# Patient Record
Sex: Female | Born: 1985 | Race: White | Hispanic: No | Marital: Single | Smoking: Current every day smoker
Health system: Southern US, Community
[De-identification: ages and names within clinical notes are randomized; demographics above are authoritative.]

## PROBLEM LIST (undated history)

## (undated) DIAGNOSIS — F1020 Alcohol dependence, uncomplicated: Secondary | ICD-10-CM

## (undated) DIAGNOSIS — B977 Papillomavirus as the cause of diseases classified elsewhere: Secondary | ICD-10-CM

## (undated) DIAGNOSIS — F4310 Post-traumatic stress disorder, unspecified: Secondary | ICD-10-CM

## (undated) DIAGNOSIS — F32A Depression, unspecified: Secondary | ICD-10-CM

## (undated) DIAGNOSIS — Z789 Other specified health status: Secondary | ICD-10-CM

## (undated) HISTORY — PX: NO PAST SURGERIES: SHX2092

---
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
## 2006-04-07 ENCOUNTER — Inpatient Hospital Stay (HOSPITAL_COMMUNITY): Admission: AD | Admit: 2006-04-07 | Discharge: 2006-04-08 | Payer: Self-pay | Admitting: Obstetrics

## 2006-05-04 ENCOUNTER — Ambulatory Visit (HOSPITAL_COMMUNITY): Admission: RE | Admit: 2006-05-04 | Discharge: 2006-05-04 | Payer: Self-pay | Admitting: Obstetrics & Gynecology

## 2006-05-24 ENCOUNTER — Ambulatory Visit (HOSPITAL_COMMUNITY): Admission: RE | Admit: 2006-05-24 | Discharge: 2006-05-24 | Payer: Self-pay | Admitting: Obstetrics & Gynecology

## 2006-06-03 ENCOUNTER — Ambulatory Visit (HOSPITAL_COMMUNITY): Admission: RE | Admit: 2006-06-03 | Discharge: 2006-06-03 | Payer: Self-pay | Admitting: Obstetrics & Gynecology

## 2006-10-07 ENCOUNTER — Inpatient Hospital Stay (HOSPITAL_COMMUNITY): Admission: AD | Admit: 2006-10-07 | Discharge: 2006-10-07 | Payer: Self-pay | Admitting: Obstetrics

## 2006-10-16 ENCOUNTER — Inpatient Hospital Stay (HOSPITAL_COMMUNITY): Admission: AD | Admit: 2006-10-16 | Discharge: 2006-10-16 | Payer: Self-pay | Admitting: Obstetrics

## 2006-10-17 ENCOUNTER — Inpatient Hospital Stay (HOSPITAL_COMMUNITY): Admission: AD | Admit: 2006-10-17 | Discharge: 2006-10-21 | Payer: Self-pay | Admitting: Obstetrics & Gynecology

## 2007-08-29 ENCOUNTER — Other Ambulatory Visit: Payer: Self-pay | Admitting: Emergency Medicine

## 2007-08-29 ENCOUNTER — Inpatient Hospital Stay (HOSPITAL_COMMUNITY): Admission: AD | Admit: 2007-08-29 | Discharge: 2007-08-29 | Payer: Self-pay | Admitting: Obstetrics & Gynecology

## 2008-06-10 ENCOUNTER — Emergency Department (HOSPITAL_COMMUNITY): Admission: EM | Admit: 2008-06-10 | Discharge: 2008-06-10 | Payer: Self-pay | Admitting: Family Medicine

## 2008-12-02 ENCOUNTER — Inpatient Hospital Stay (HOSPITAL_COMMUNITY): Admission: AD | Admit: 2008-12-02 | Discharge: 2008-12-03 | Payer: Self-pay | Admitting: Obstetrics & Gynecology

## 2008-12-26 ENCOUNTER — Emergency Department (HOSPITAL_COMMUNITY): Admission: EM | Admit: 2008-12-26 | Discharge: 2008-12-26 | Payer: Self-pay | Admitting: Emergency Medicine

## 2009-01-06 ENCOUNTER — Emergency Department (HOSPITAL_COMMUNITY): Admission: EM | Admit: 2009-01-06 | Discharge: 2009-01-07 | Payer: Self-pay | Admitting: Emergency Medicine

## 2009-01-22 ENCOUNTER — Inpatient Hospital Stay (HOSPITAL_COMMUNITY): Admission: AD | Admit: 2009-01-22 | Discharge: 2009-01-22 | Payer: Self-pay | Admitting: Obstetrics & Gynecology

## 2009-01-22 ENCOUNTER — Ambulatory Visit: Payer: Self-pay | Admitting: Advanced Practice Midwife

## 2009-02-17 ENCOUNTER — Inpatient Hospital Stay (HOSPITAL_COMMUNITY): Admission: AD | Admit: 2009-02-17 | Discharge: 2009-02-17 | Payer: Self-pay | Admitting: Obstetrics & Gynecology

## 2009-02-20 ENCOUNTER — Emergency Department (HOSPITAL_COMMUNITY): Admission: EM | Admit: 2009-02-20 | Discharge: 2009-02-20 | Payer: Self-pay | Admitting: Family Medicine

## 2009-02-21 ENCOUNTER — Inpatient Hospital Stay (HOSPITAL_COMMUNITY): Admission: AD | Admit: 2009-02-21 | Discharge: 2009-02-21 | Payer: Self-pay | Admitting: Obstetrics & Gynecology

## 2009-03-09 ENCOUNTER — Emergency Department (HOSPITAL_COMMUNITY): Admission: EM | Admit: 2009-03-09 | Discharge: 2009-03-09 | Payer: Self-pay | Admitting: Emergency Medicine

## 2009-03-29 ENCOUNTER — Emergency Department (HOSPITAL_COMMUNITY): Admission: EM | Admit: 2009-03-29 | Discharge: 2009-03-29 | Payer: Self-pay | Admitting: Emergency Medicine

## 2009-04-18 ENCOUNTER — Inpatient Hospital Stay: Admission: AD | Admit: 2009-04-18 | Discharge: 2009-04-18 | Payer: Self-pay | Admitting: Obstetrics & Gynecology

## 2009-05-20 ENCOUNTER — Inpatient Hospital Stay (HOSPITAL_COMMUNITY): Admission: AD | Admit: 2009-05-20 | Discharge: 2009-05-20 | Payer: Self-pay | Admitting: Obstetrics & Gynecology

## 2009-07-01 ENCOUNTER — Inpatient Hospital Stay (HOSPITAL_COMMUNITY): Admission: AD | Admit: 2009-07-01 | Discharge: 2009-07-01 | Payer: Self-pay | Admitting: Family Medicine

## 2009-12-14 ENCOUNTER — Ambulatory Visit: Payer: Self-pay | Admitting: Advanced Practice Midwife

## 2009-12-14 ENCOUNTER — Inpatient Hospital Stay (HOSPITAL_COMMUNITY): Admission: AD | Admit: 2009-12-14 | Discharge: 2009-12-14 | Payer: Self-pay | Admitting: Obstetrics and Gynecology

## 2009-12-23 ENCOUNTER — Inpatient Hospital Stay (HOSPITAL_COMMUNITY): Admission: AD | Admit: 2009-12-23 | Discharge: 2009-12-23 | Payer: Self-pay | Admitting: Obstetrics & Gynecology

## 2010-03-18 ENCOUNTER — Ambulatory Visit (HOSPITAL_COMMUNITY): Admission: RE | Admit: 2010-03-18 | Discharge: 2010-03-18 | Payer: Self-pay | Admitting: Obstetrics & Gynecology

## 2010-04-06 ENCOUNTER — Ambulatory Visit: Payer: Self-pay | Admitting: Obstetrics and Gynecology

## 2010-04-06 ENCOUNTER — Inpatient Hospital Stay (HOSPITAL_COMMUNITY): Admission: AD | Admit: 2010-04-06 | Discharge: 2010-04-06 | Payer: Self-pay | Admitting: Obstetrics & Gynecology

## 2010-06-26 ENCOUNTER — Inpatient Hospital Stay (HOSPITAL_COMMUNITY): Admission: AD | Admit: 2010-06-26 | Discharge: 2010-06-26 | Payer: Self-pay | Admitting: Obstetrics

## 2010-08-15 ENCOUNTER — Inpatient Hospital Stay (HOSPITAL_COMMUNITY)
Admission: AD | Admit: 2010-08-15 | Discharge: 2010-08-15 | Payer: Self-pay | Source: Home / Self Care | Attending: Obstetrics | Admitting: Obstetrics

## 2010-08-16 ENCOUNTER — Inpatient Hospital Stay (HOSPITAL_COMMUNITY)
Admission: AD | Admit: 2010-08-16 | Discharge: 2010-08-18 | Payer: Self-pay | Source: Home / Self Care | Attending: Obstetrics & Gynecology | Admitting: Obstetrics & Gynecology

## 2010-08-16 ENCOUNTER — Encounter: Payer: Self-pay | Admitting: Obstetrics & Gynecology

## 2010-10-26 LAB — CBC
HCT: 33 % — ABNORMAL LOW (ref 36.0–46.0)
MCH: 27.5 pg (ref 26.0–34.0)
MCHC: 33.3 g/dL (ref 30.0–36.0)
MCHC: 33.6 g/dL (ref 30.0–36.0)
MCV: 82.9 fL (ref 78.0–100.0)
Platelets: 167 10*3/uL (ref 150–400)
RDW: 13.6 % (ref 11.5–15.5)
RDW: 13.7 % (ref 11.5–15.5)
WBC: 8.5 10*3/uL (ref 4.0–10.5)

## 2010-10-26 LAB — RPR: RPR Ser Ql: NONREACTIVE

## 2010-10-27 LAB — URINALYSIS, ROUTINE W REFLEX MICROSCOPIC
Bilirubin Urine: NEGATIVE
Nitrite: NEGATIVE
Protein, ur: NEGATIVE mg/dL
Specific Gravity, Urine: 1.01 (ref 1.005–1.030)
Urobilinogen, UA: 0.2 mg/dL (ref 0.0–1.0)

## 2010-10-30 LAB — WET PREP, GENITAL: Yeast Wet Prep HPF POC: NONE SEEN

## 2010-10-30 LAB — URINALYSIS, ROUTINE W REFLEX MICROSCOPIC
Bilirubin Urine: NEGATIVE
Glucose, UA: NEGATIVE mg/dL
Hgb urine dipstick: NEGATIVE
Ketones, ur: NEGATIVE mg/dL
Protein, ur: NEGATIVE mg/dL
Urobilinogen, UA: 0.2 mg/dL (ref 0.0–1.0)

## 2010-11-03 LAB — CBC
HCT: 37.7 % (ref 36.0–46.0)
MCV: 94.5 fL (ref 78.0–100.0)
Platelets: 220 10*3/uL (ref 150–400)
RDW: 12.4 % (ref 11.5–15.5)
WBC: 7.8 10*3/uL (ref 4.0–10.5)

## 2010-11-03 LAB — WET PREP, GENITAL
Trich, Wet Prep: NONE SEEN
Yeast Wet Prep HPF POC: NONE SEEN

## 2010-11-03 LAB — URINALYSIS, ROUTINE W REFLEX MICROSCOPIC
Bilirubin Urine: NEGATIVE
Bilirubin Urine: NEGATIVE
Ketones, ur: NEGATIVE mg/dL
Ketones, ur: NEGATIVE mg/dL
Leukocytes, UA: NEGATIVE
Nitrite: NEGATIVE
Nitrite: NEGATIVE
Protein, ur: NEGATIVE mg/dL
Specific Gravity, Urine: 1.015 (ref 1.005–1.030)
Urobilinogen, UA: 0.2 mg/dL (ref 0.0–1.0)
Urobilinogen, UA: 0.2 mg/dL (ref 0.0–1.0)
pH: 7.5 (ref 5.0–8.0)

## 2010-11-03 LAB — URINE MICROSCOPIC-ADD ON

## 2010-11-03 LAB — HCG, QUANTITATIVE, PREGNANCY: hCG, Beta Chain, Quant, S: 32261 m[IU]/mL — ABNORMAL HIGH (ref <5)

## 2010-11-03 LAB — GC/CHLAMYDIA PROBE AMP, GENITAL
Chlamydia, DNA Probe: NEGATIVE
GC Probe Amp, Genital: NEGATIVE

## 2010-11-18 ENCOUNTER — Other Ambulatory Visit: Payer: Self-pay | Admitting: Obstetrics & Gynecology

## 2010-11-18 LAB — URINALYSIS, ROUTINE W REFLEX MICROSCOPIC
Bilirubin Urine: NEGATIVE
Glucose, UA: NEGATIVE mg/dL
Leukocytes, UA: NEGATIVE
Nitrite: NEGATIVE
Specific Gravity, Urine: 1.02 (ref 1.005–1.030)
pH: 7 (ref 5.0–8.0)

## 2010-11-18 LAB — URINE MICROSCOPIC-ADD ON

## 2010-11-18 LAB — POCT PREGNANCY, URINE: Preg Test, Ur: NEGATIVE

## 2010-11-19 LAB — URINE MICROSCOPIC-ADD ON

## 2010-11-19 LAB — URINALYSIS, ROUTINE W REFLEX MICROSCOPIC
Glucose, UA: NEGATIVE mg/dL
Specific Gravity, Urine: 1.01 (ref 1.005–1.030)

## 2010-11-19 LAB — WET PREP, GENITAL

## 2010-11-20 LAB — URINALYSIS, ROUTINE W REFLEX MICROSCOPIC
Ketones, ur: NEGATIVE mg/dL
Leukocytes, UA: NEGATIVE
Nitrite: NEGATIVE
Specific Gravity, Urine: 1.025 (ref 1.005–1.030)
pH: 6.5 (ref 5.0–8.0)

## 2010-11-20 LAB — POCT PREGNANCY, URINE: Preg Test, Ur: NEGATIVE

## 2010-11-20 LAB — GC/CHLAMYDIA PROBE AMP, GENITAL: GC Probe Amp, Genital: NEGATIVE

## 2010-11-20 LAB — URINE MICROSCOPIC-ADD ON: RBC / HPF: NONE SEEN RBC/hpf (ref <3)

## 2010-11-20 LAB — WET PREP, GENITAL: Yeast Wet Prep HPF POC: NONE SEEN

## 2010-11-22 LAB — WET PREP, GENITAL: Clue Cells Wet Prep HPF POC: NONE SEEN

## 2010-11-22 LAB — URINALYSIS, ROUTINE W REFLEX MICROSCOPIC
Bilirubin Urine: NEGATIVE
Glucose, UA: NEGATIVE mg/dL
Ketones, ur: NEGATIVE mg/dL
Leukocytes, UA: NEGATIVE
Nitrite: NEGATIVE
Nitrite: NEGATIVE
Protein, ur: NEGATIVE mg/dL
Urobilinogen, UA: 0.2 mg/dL (ref 0.0–1.0)
pH: 6 (ref 5.0–8.0)

## 2010-11-22 LAB — URINE MICROSCOPIC-ADD ON

## 2010-12-06 ENCOUNTER — Emergency Department (HOSPITAL_COMMUNITY): Payer: Medicaid Other

## 2010-12-06 ENCOUNTER — Emergency Department (HOSPITAL_COMMUNITY)
Admission: EM | Admit: 2010-12-06 | Discharge: 2010-12-06 | Disposition: A | Discharge status: Home / Self Care | Payer: Medicaid Other | Attending: Emergency Medicine | Admitting: Emergency Medicine

## 2010-12-06 DIAGNOSIS — R1011 Right upper quadrant pain: Secondary | ICD-10-CM | POA: Insufficient documentation

## 2010-12-06 DIAGNOSIS — R112 Nausea with vomiting, unspecified: Secondary | ICD-10-CM | POA: Insufficient documentation

## 2010-12-06 DIAGNOSIS — R197 Diarrhea, unspecified: Secondary | ICD-10-CM | POA: Insufficient documentation

## 2010-12-06 DIAGNOSIS — R079 Chest pain, unspecified: Secondary | ICD-10-CM | POA: Insufficient documentation

## 2010-12-06 DIAGNOSIS — R630 Anorexia: Secondary | ICD-10-CM | POA: Insufficient documentation

## 2010-12-06 LAB — URINALYSIS, ROUTINE W REFLEX MICROSCOPIC
Protein, ur: NEGATIVE mg/dL
Urobilinogen, UA: 0.2 mg/dL (ref 0.0–1.0)

## 2010-12-06 LAB — CBC
Platelets: 248 10*3/uL (ref 150–400)
RDW: 13.9 % (ref 11.5–15.5)
WBC: 6.9 10*3/uL (ref 4.0–10.5)

## 2010-12-06 LAB — COMPREHENSIVE METABOLIC PANEL
AST: 23 U/L (ref 0–37)
Albumin: 3.9 g/dL (ref 3.5–5.2)
CO2: 25 mEq/L (ref 19–32)
Calcium: 9.1 mg/dL (ref 8.4–10.5)
Creatinine, Ser: 0.61 mg/dL (ref 0.4–1.2)
GFR calc Af Amer: >60 mL/min (ref >60)
GFR calc non Af Amer: >60 mL/min (ref >60)

## 2010-12-06 LAB — DIFFERENTIAL
Basophils Absolute: 0 10*3/uL (ref 0.0–0.1)
Eosinophils Absolute: 0.2 10*3/uL (ref 0.0–0.7)
Eosinophils Relative: 3 % (ref 0–5)

## 2010-12-17 ENCOUNTER — Inpatient Hospital Stay (HOSPITAL_COMMUNITY)
Admission: AD | Admit: 2010-12-17 | Discharge: 2010-12-17 | Disposition: A | Discharge status: Home / Self Care | Payer: Medicaid Other | Source: Ambulatory Visit | Attending: Obstetrics & Gynecology | Admitting: Obstetrics & Gynecology

## 2010-12-17 DIAGNOSIS — N949 Unspecified condition associated with female genital organs and menstrual cycle: Secondary | ICD-10-CM | POA: Insufficient documentation

## 2010-12-17 DIAGNOSIS — N390 Urinary tract infection, site not specified: Secondary | ICD-10-CM

## 2010-12-17 LAB — URINALYSIS, ROUTINE W REFLEX MICROSCOPIC
Glucose, UA: NEGATIVE mg/dL
Nitrite: NEGATIVE
Protein, ur: NEGATIVE mg/dL
Urobilinogen, UA: 0.2 mg/dL (ref 0.0–1.0)

## 2010-12-17 LAB — URINE MICROSCOPIC-ADD ON

## 2010-12-17 LAB — POCT PREGNANCY, URINE: Preg Test, Ur: NEGATIVE

## 2010-12-17 LAB — WET PREP, GENITAL: Clue Cells Wet Prep HPF POC: NONE SEEN

## 2010-12-18 LAB — GC/CHLAMYDIA PROBE AMP, GENITAL
Chlamydia, DNA Probe: NEGATIVE
GC Probe Amp, Genital: NEGATIVE

## 2011-01-01 NOTE — Op Note (Signed)
[name], [name] NO.:  [id number]   MEDICAL RECORD NO.:  [id number]          PATIENT TYPE:  INP   LOCATION:  9174                          FACILITY:  [facility]   PHYSICIAN:  Kathreen Cosier, M.D.DATE OF BIRTH:  Oct 03, 1985   DATE OF PROCEDURE:  DATE OF DISCHARGE:                               OPERATIVE REPORT   DELIVERY NOTE:  Patient is a 25 year old primigravida who received  Pitocin stimulation and became fully dilated.  With each push, she had  deep variables, down to 90 with full recovery.  She pushed a +3 station.  Her perineum was injected with 1% Xylocaine.  A midline episiotomy done.  A vacuum applied at +3 station.  It was applied through one contraction  and push with no popoff.  Delivery effected.  There was a nuchal cord  cut prior to delivery of the shoulder.  She had a female Apgar 8 and 9,  weighing 7 pounds and 2 ounces from the ROA position.  The placenta was  spontaneously intact.  The fourth degree, the rectal mucosa was repaired  in two layers with 2-0 Vicryl suture, and then the anal sphincter  repaired in layers, and the episiotomy repaired in layers in the usual  manner.  Post repair, the mucosa of the rectum was noted to be intact,  and the sphincter was intact.  The patient tolerated the procedure well.           ______________________________  Kathreen Cosier, M.D.     BAM/MEDQ  D:  10/18/2006  T:  10/19/2006  Job:  [id number]

## 2011-04-04 ENCOUNTER — Emergency Department (HOSPITAL_COMMUNITY)
Admission: EM | Admit: 2011-04-04 | Discharge: 2011-04-04 | Disposition: A | Discharge status: Home / Self Care | Payer: Medicaid Other | Attending: Emergency Medicine | Admitting: Emergency Medicine

## 2011-04-04 DIAGNOSIS — L299 Pruritus, unspecified: Secondary | ICD-10-CM | POA: Insufficient documentation

## 2011-04-04 DIAGNOSIS — L259 Unspecified contact dermatitis, unspecified cause: Secondary | ICD-10-CM | POA: Insufficient documentation

## 2011-05-06 LAB — I-STAT 8, (EC8 V) (CONVERTED LAB)
BUN: 8
Glucose, Bld: 88
HCT: 45
Hemoglobin: 15.3 — ABNORMAL HIGH
Operator id: 235561
Potassium: 4
Sodium: 139

## 2011-05-06 LAB — POCT URINALYSIS DIP (DEVICE)
Hgb urine dipstick: NEGATIVE
Protein, ur: 30 — AB
Specific Gravity, Urine: 1.02
Urobilinogen, UA: 0.2
pH: 7

## 2011-05-06 LAB — WET PREP, GENITAL: Yeast Wet Prep HPF POC: NONE SEEN

## 2011-05-06 LAB — GC/CHLAMYDIA PROBE AMP, GENITAL: Chlamydia, DNA Probe: POSITIVE — AB

## 2011-05-06 LAB — ABO/RH: ABO/RH(D): B POS

## 2011-05-17 LAB — POCT RAPID STREP A: Streptococcus, Group A Screen (Direct): NEGATIVE

## 2011-07-29 IMAGING — US US OB COMP LESS 14 WK
1 series · 14 of 28 positions shown · non-contrast
Comparison: none

OBSTETRICAL ULTRASOUND:
 This ultrasound exam was performed in the [HOSPITAL] Ultrasound Department.  The OB US report was generated in the AS system, and faxed to the ordering physician.  This report is also available in [HOSPITAL]?s Access[employer] and in [REDACTED] PACS.

[Series 1: us ob comp less 14 wks · 0.18mm/px · 14 of 37 slices shown]
[im 2/37]
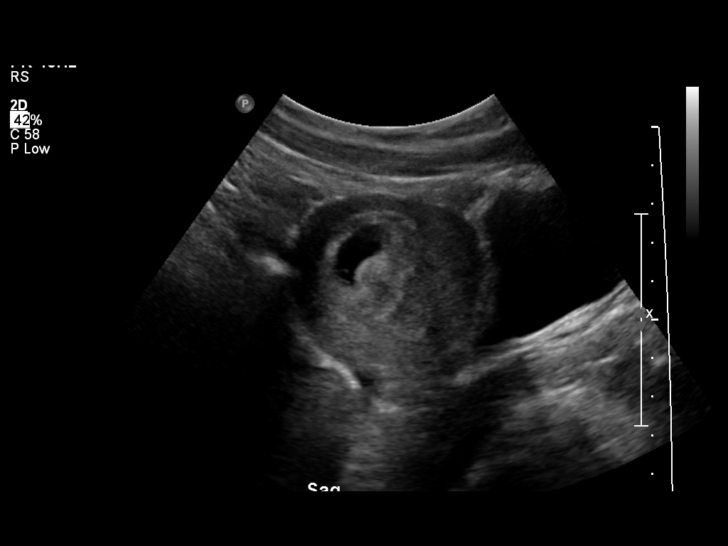
[im 5/37]
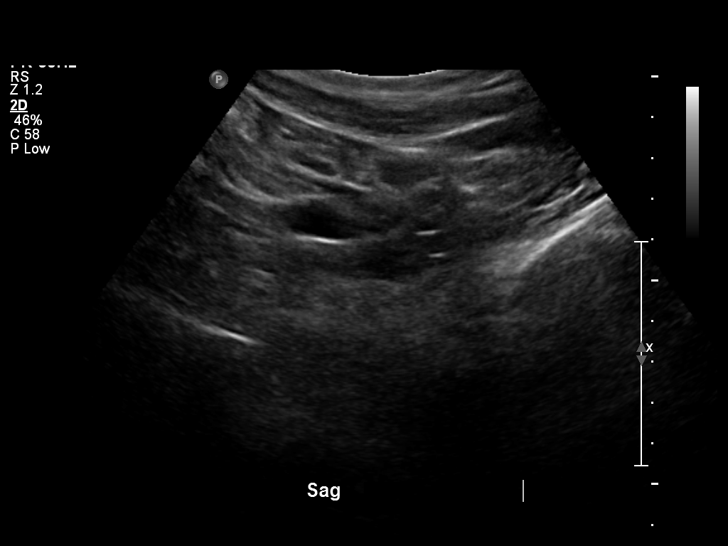
[im 7/37]
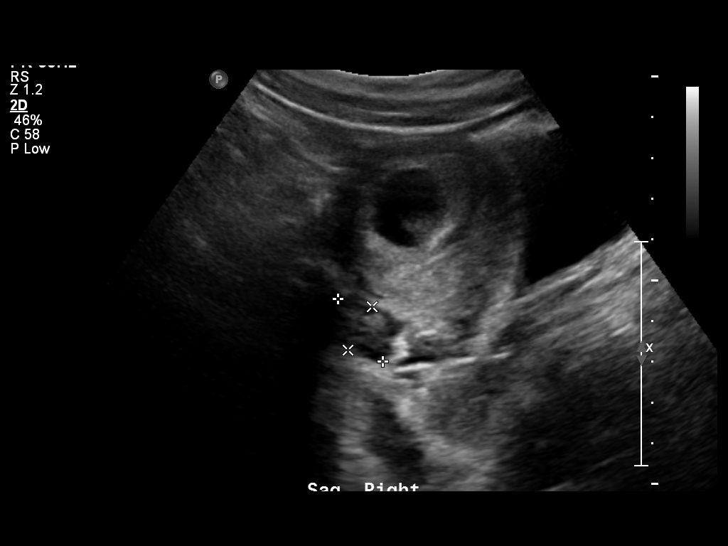
[im 10/37]
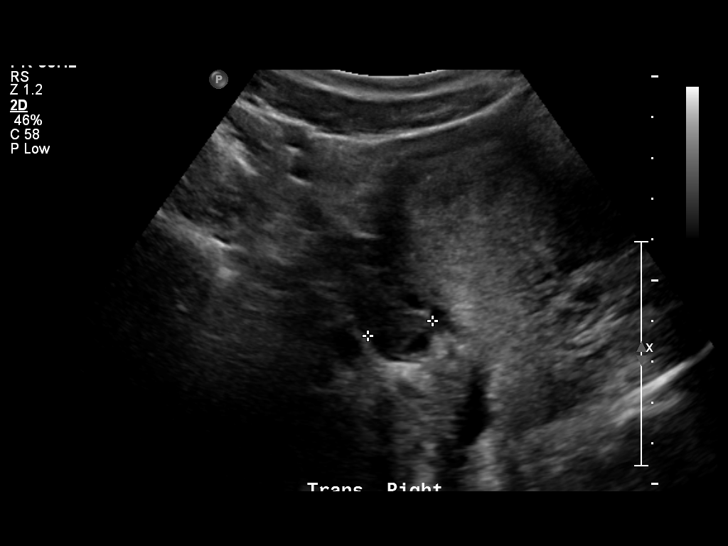
[im 13/37]
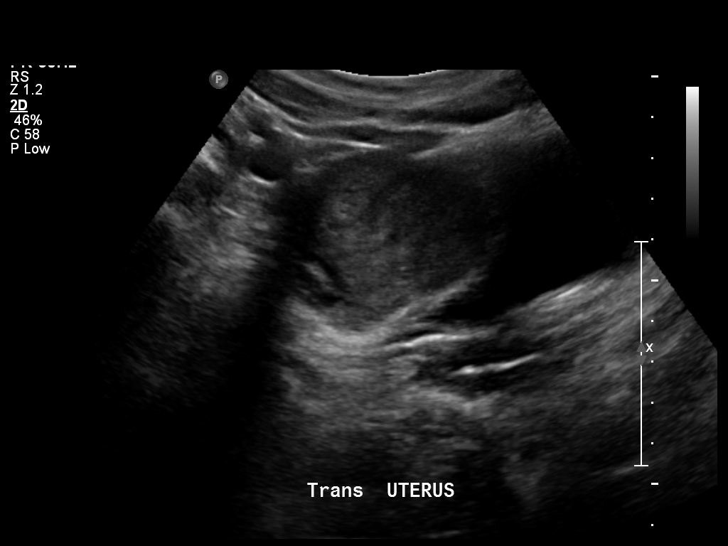
[im 15/37]
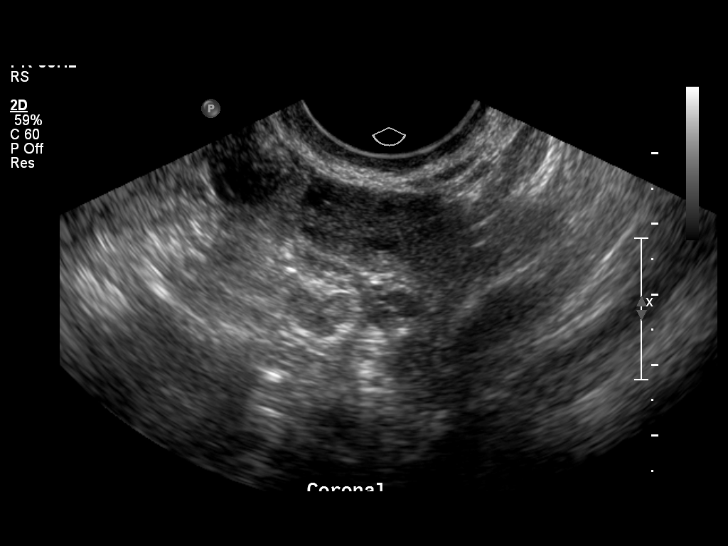
[im 18/37]
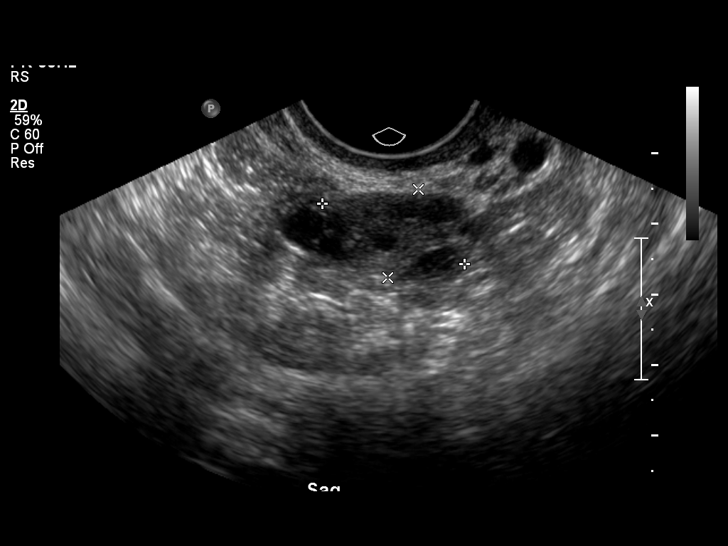
[im 21/37]
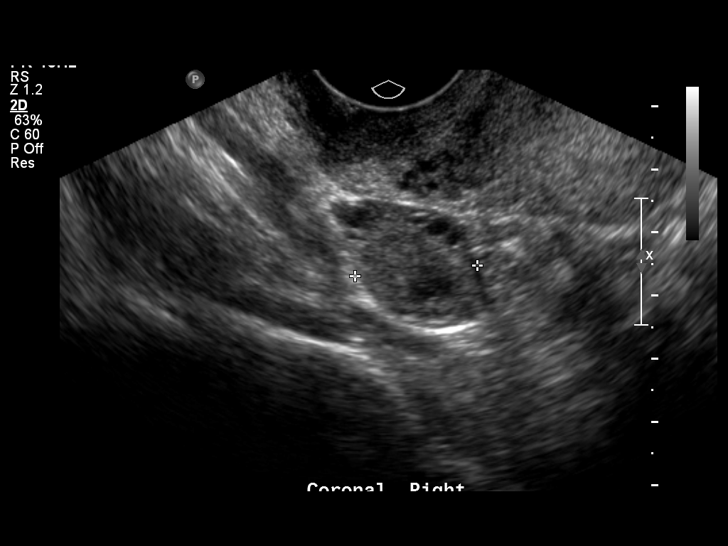
[im 23/37]
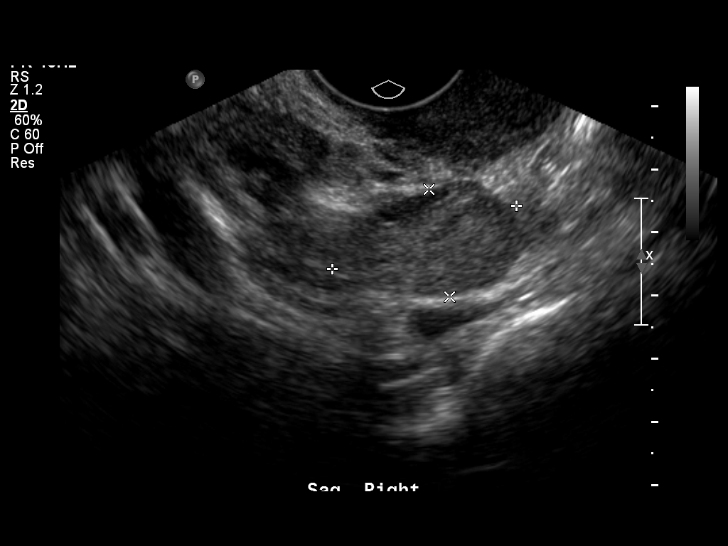
[im 26/37]
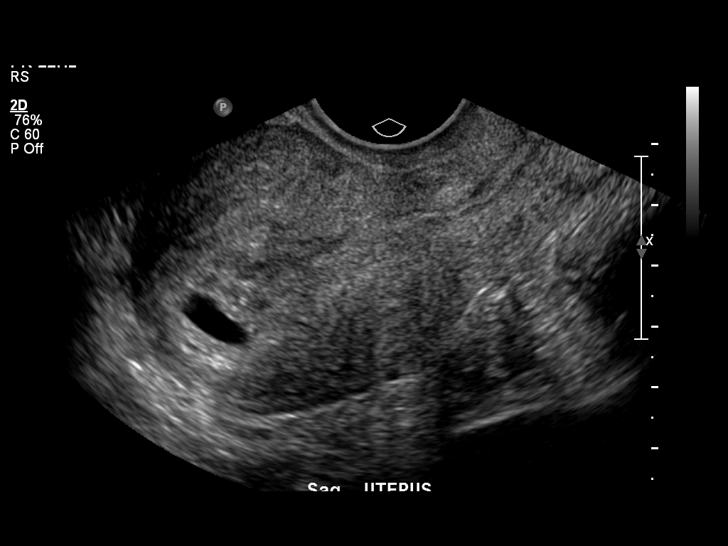
[im 29/37]
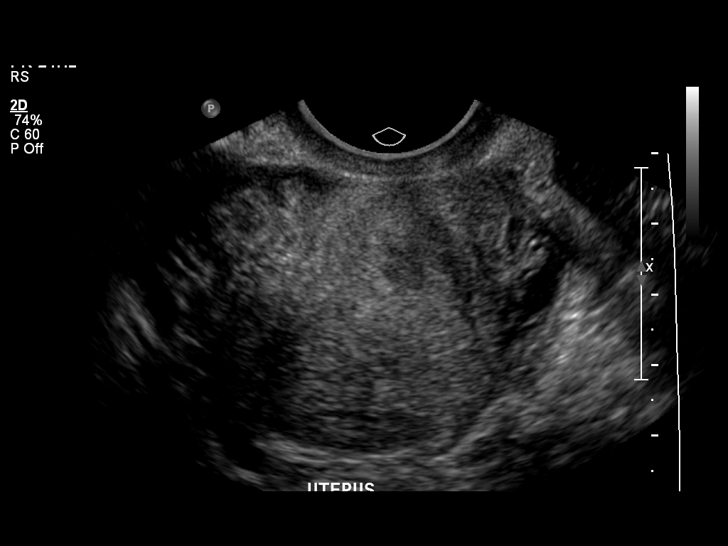
[im 31/37]
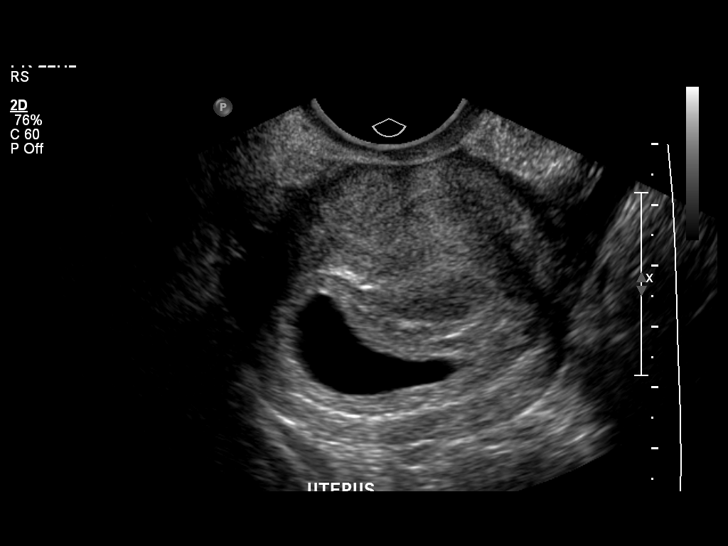
[im 34/37]
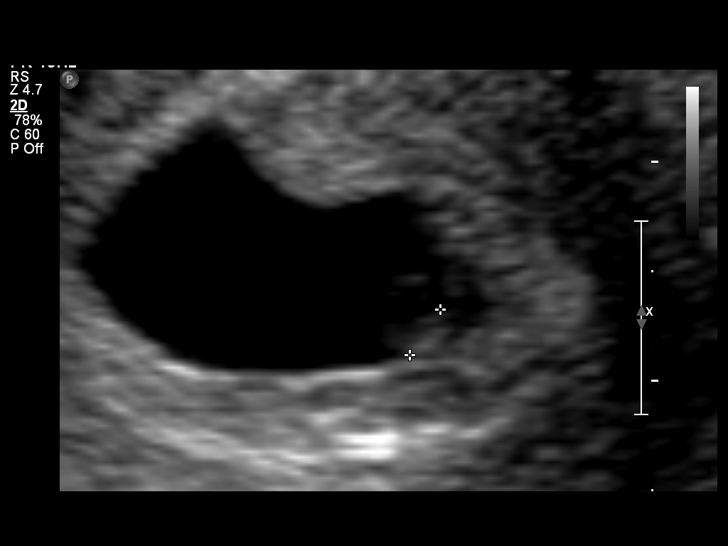
[im 37/37]
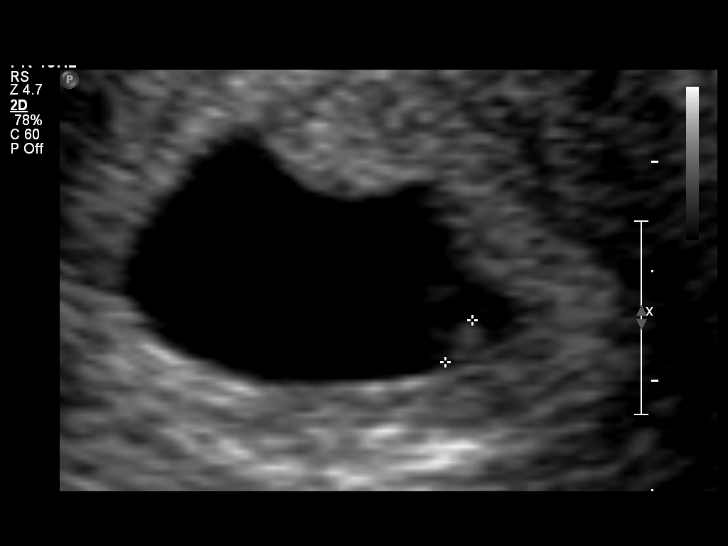

[14 of 28 positions shown; findings below may reference images not displayed]

IMPRESSION: See AS Obstetric US report.

## 2011-09-28 ENCOUNTER — Emergency Department (HOSPITAL_COMMUNITY)
Admission: EM | Admit: 2011-09-28 | Discharge: 2011-09-28 | Disposition: A | Discharge status: Home / Self Care | Payer: Medicaid Other | Attending: Emergency Medicine | Admitting: Emergency Medicine

## 2011-09-28 ENCOUNTER — Encounter (HOSPITAL_COMMUNITY): Payer: Self-pay

## 2011-09-28 DIAGNOSIS — F17200 Nicotine dependence, unspecified, uncomplicated: Secondary | ICD-10-CM | POA: Insufficient documentation

## 2011-09-28 DIAGNOSIS — L299 Pruritus, unspecified: Secondary | ICD-10-CM | POA: Insufficient documentation

## 2011-09-28 DIAGNOSIS — R21 Rash and other nonspecific skin eruption: Secondary | ICD-10-CM | POA: Insufficient documentation

## 2011-09-28 MED ORDER — DIPHENHYDRAMINE HCL 25 MG PO CAPS: 25.0000 mg | ORAL_CAPSULE | Freq: Four times a day (QID) | ORAL | Status: DC | PRN

## 2011-09-28 MED ORDER — PERMETHRIN 5 % EX CREA: TOPICAL_CREAM | CUTANEOUS | Status: AC

## 2011-09-28 MED ORDER — FAMOTIDINE 20 MG PO TABS: 20.0000 mg | ORAL_TABLET | Freq: Two times a day (BID) | ORAL | Status: DC

## 2011-09-28 MED ORDER — TRIAMCINOLONE ACETONIDE 0.1 % EX CREA: TOPICAL_CREAM | Freq: Two times a day (BID) | CUTANEOUS | Status: DC

## 2011-09-28 NOTE — Discharge Instructions (Signed)
I suspect your rash is most likely a reaction to a product. Switch to hypo allergenic products. Take benadryl and pepcid as prescribed. Apply kenalog cream to the rash areas. Avoid scratching. If no improvement, try permethrin cream for possible scabies as prescribed. Follow up with your doctor for recheck.  Rash, Generic Many things can cause a rash. We are not certain what is causing the rash that you have. Some causes include infection, allergic reactions, medications, and chemicals. Sometimes something in your home that comes in contact with your skin may cause the rash. These include pets, new soaps, cosmetics, and foods. HOME CARE INSTRUCTIONS   Avoid extreme heat or cold, unless otherwise instructed. This can make the itching worse.   A cool bath or shower or a cool washcloth can sometimes ease the itching.   Avoid scratching. This can cause infection.   Take those medications prescribed by your caregiver.  SEEK IMMEDIATE MEDICAL CARE IF:  You develop increasing pain, swelling, or redness.   You develop a fever.   You develop new or severe symptoms such as body aches and pains, diarrhea, vomiting.   Your rash is not better in 3 days.  Document Released: 07/23/2002 Document Revised: 04/14/2011 Document Reviewed: 09/27/2008 [facility] Patient Information 2012 [place], [place].

## 2011-09-28 NOTE — ED Notes (Signed)
Hx of rash to back and wrist, itchy, pt sts that recent fungul infection also

## 2011-09-28 NOTE — ED Notes (Signed)
Discharge instructions reviewed; verbalizes understanding.  No questions asked; no further c/o's voiced.  Ambulatory to lobby. 

## 2011-09-28 NOTE — ED Provider Notes (Signed)
History     CSN: [id number]  Arrival date & time 09/28/11  1137   First MD Initiated Contact with Patient 09/28/11 1249      Chief Complaint  Patient presents with  . Rash    (Consider location/radiation/quality/duration/timing/severity/associated sxs/prior treatment) Patient is a 26 y.o. female presenting with rash. The history is provided by the patient.  Rash  This is a new problem. The current episode started more than 1 week ago. The problem has been gradually worsening. The problem is associated with an unknown factor. There has been no fever. The patient is experiencing no pain. Associated symptoms include itching.  Pt states bumps to back, groin, some to left hand. States started after she took a bubble bath with bath and body works products. Hx reaction to those products before. Pt states rash is very itchy. Took benadryl with no relief. No other new products. No one in the house with same rash.  History reviewed. No pertinent past medical history.  History reviewed. No pertinent past surgical history.  History reviewed. No pertinent family history.  History  Substance Use Topics  . Smoking status: Current Everyday Smoker  . Smokeless tobacco: Not on file  . Alcohol Use: Yes    OB History    Grav Para Term Preterm Abortions TAB SAB Ect Mult Living                  Review of Systems  Constitutional: Negative for fever, chills and fatigue.  HENT: Negative.   Eyes: Negative.   Respiratory: Negative.   Cardiovascular: Negative.   Gastrointestinal: Negative.   Genitourinary: Negative.   Musculoskeletal: Negative.   Skin: Positive for itching and rash.  Neurological: Negative.   Psychiatric/Behavioral: Negative.     Allergies  Apple fruit extract  Home Medications   Current Outpatient Rx  Name Route Sig Dispense Refill  . DIPHENHYDRAMINE HCL 25 MG PO TABS Oral Take 25 mg by mouth every 6 (six) hours as needed. For itching      BP 129/81  Pulse 92   Temp(Src) 98.5 F (36.9 C) (Oral)  Resp 20  SpO2 97%  LMP 08/30/2011  Physical Exam  Nursing note and vitals reviewed. Constitutional: She is oriented to person, place, and time. She appears well-developed and well-nourished. No distress.  HENT:  Head: Normocephalic.  Eyes: Conjunctivae are normal.  Neck: Neck supple.  Cardiovascular: Normal rate, regular rhythm and normal heart sounds.   Pulmonary/Chest: Effort normal and breath sounds normal. No respiratory distress.  Abdominal: Soft. Bowel sounds are normal.  Neurological: She is alert and oriented to person, place, and time.  Skin: Skin is warm and dry.       erythemous papules in linear patters with excoriations to the bilateral lower back, bilateral groin, and few to the right dorsal had.  Psychiatric: She has a normal mood and affect.    ED Course  Procedures (including critical care time)  Pt with new itchy rash. Suspect contact dermatitis vs possibly scabies. Correct pattern and appearance of rash to that of scabies, however no rash on ankles, wrists, toe and finger webs. No one in the family with same rash. Will treat with topical steroids, benadryl, pepcid.Will provide medication for possible scabies, in case not improving. No diagnosis found.    MDM          Lottie Mussel, PA 09/28/11 1304

## 2011-09-30 NOTE — ED Provider Notes (Signed)
Medical screening examination/treatment/procedure(s) were performed by non-physician practitioner and as supervising physician I was immediately available for consultation/collaboration.  Nicholes Stairs, MD 09/30/11 1531

## 2012-02-07 ENCOUNTER — Encounter (HOSPITAL_COMMUNITY): Payer: Self-pay | Admitting: *Deleted

## 2012-02-07 DIAGNOSIS — L089 Local infection of the skin and subcutaneous tissue, unspecified: Secondary | ICD-10-CM | POA: Insufficient documentation

## 2012-02-07 NOTE — ED Notes (Signed)
The pt has a pierced rt ring finger.  It has started getting red and inflamned for the past 3-4 days

## 2012-02-08 ENCOUNTER — Emergency Department (HOSPITAL_COMMUNITY)
Admission: EM | Admit: 2012-02-08 | Discharge: 2012-02-08 | Disposition: A | Discharge status: Home / Self Care | Payer: Self-pay | Attending: Emergency Medicine | Admitting: Emergency Medicine

## 2012-02-08 DIAGNOSIS — L089 Local infection of the skin and subcutaneous tissue, unspecified: Secondary | ICD-10-CM

## 2012-02-08 MED ORDER — LIDOCAINE HCL 2 % IJ SOLN: 5.0000 mL | Freq: Once | INTRAMUSCULAR | Status: DC

## 2012-02-08 MED ORDER — CLINDAMYCIN HCL 150 MG PO CAPS: 150.0000 mg | ORAL_CAPSULE | Freq: Four times a day (QID) | ORAL | Status: AC

## 2012-02-08 NOTE — ED Provider Notes (Signed)
History     CSN: [id number]  Arrival date & time 02/07/12  2324   First MD Initiated Contact with Patient 02/08/12 0114      Chief Complaint  Patient presents with  . infected finger     (Consider location/radiation/quality/duration/timing/severity/associated sxs/prior treatment) HPI History provided by patient. Has jewelry piercing and right ring finger dorsal aspect that is bothering her for the last 3-4 days. She has noticed increased pain but denies any drainage. No redness. Some swelling. No difficulty flexing or extending that finger. No weakness or numbness. No fevers or chills. Has multiple piercings. 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 Pain is sharp and moderate severity. No radiation of pain. History reviewed. No pertinent past medical history.  History reviewed. No pertinent past surgical history.  No family history on file.  History  Substance Use Topics  . Smoking status: Current Everyday Smoker  . Smokeless tobacco: Not on file  . Alcohol Use: Yes    OB History    Grav Para Term Preterm Abortions TAB SAB Ect Mult Living                  Review of Systems  Constitutional: Negative for fever and chills.  HENT: Negative for neck pain and neck stiffness.   Eyes: Negative for pain.  Respiratory: Negative for shortness of breath.   Cardiovascular: Negative for chest pain.  Gastrointestinal: Negative for abdominal pain.  Genitourinary: Negative for dysuria.  Musculoskeletal: Negative for back pain.  Skin: Positive for wound. Negative for rash.  Neurological: Negative for headaches.  All other systems reviewed and are negative.    Allergies  Apple fruit extract  Home Medications  No current outpatient prescriptions on file.  BP 139/90  Pulse 93  Temp 98.3 F (36.8 C) (Oral)  Resp 20  SpO2 99%  Physical Exam  Constitutional: She is oriented to person, place, and time.  She appears well-developed and well-nourished.  HENT:  Head: Normocephalic and atraumatic.  Eyes: Conjunctivae and EOM are normal. Pupils are equal, round, and reactive to light.  Neck: Trachea normal. Neck supple.  Cardiovascular: Normal rate, regular rhythm, S1 normal, S2 normal and normal pulses.     No systolic murmur is present   No diastolic murmur is present  Pulses:      Radial pulses are 2+ on the right side, and 2+ on the left side.  Pulmonary/Chest: Effort normal and breath sounds normal. She has no wheezes. She has no rhonchi. She has no rales. She exhibits no tenderness.  Abdominal: Soft. Normal appearance and bowel sounds are normal. There is no CVA tenderness and negative Murphy's sign.  Musculoskeletal:       R 4th digit with piercing over her dorsal aspect of proximal phalanx. There is underlying area of swelling with minimal erythema. No streaking erythema. No pain with flexion or extension and full range of motion with distal cap Refill intact.  Neurological: She is alert and oriented to person, place, and time. She has normal strength. No cranial nerve deficit or sensory deficit. GCS eye subscore is 4. GCS verbal subscore is 5. GCS motor subscore is 6.  Skin: Skin is warm and dry. No rash noted. She is not diaphoretic.  Psychiatric: Her speech is normal.       Cooperative and appropriate    ED Course  FOREIGN BODY REMOVAL Date/Time: 02/08/2012 4:01 AM Performed by:  Annika Selke Authorized by: Sunnie Nielsen Consent: Verbal consent obtained. Risks and benefits: risks, benefits and alternatives were discussed Consent given by: patient Patient understanding: patient states understanding of the procedure being performed Patient consent: the patient's understanding of the procedure matches consent given Procedure consent: procedure consent matches procedure scheduled Required items: required blood products, implants, devices, and special equipment available Patient  identity confirmed: verbally with patient Time out: Immediately prior to procedure a "time out" was called to verify the correct patient, procedure, equipment, support staff and site/side marked as required. Body area: skin General location: upper extremity Location details: right ring finger Anesthesia: local infiltration Local anesthetic: lidocaine 1% without epinephrine Anesthetic total: 2 ml Patient cooperative: yes Removal mechanism: forceps Tendon involvement: none Depth: subcutaneous Complexity: complex 1 objects recovered. Objects recovered: 1 Post-procedure assessment: foreign body removed Patient tolerance: Patient tolerated the procedure well with no immediate complications.   Case d/w Dr Orlan Leavens who agrees with plan remove FB and he will f/u in clinic for recheck.  After foreign body removal, wound irrigated extensively with sterile normal saline. Bandage placed. Extension and flexion remains intact without visualized tendon.  MDM   Right fourth digit jewelry infection with foreign body removal. Plan followup in pain clinic in 2 days for recheck. ABx prescribed.        Sunnie Nielsen, MD 02/08/12 [phone]

## 2012-02-08 NOTE — Discharge Instructions (Signed)
Skin Infections A skin infection usually develops as a result of disruption of the skin barrier. It is very important that you followup in the clinic as discussed in 2 days. Take antibiotics as prescribed. Keep wound clean dry and covered. CAUSES  A skin infection might occur following:  Trauma or an injury to the skin such as a cut or insect sting.   Inflammation (as in eczema).   Breaks in the skin between the toes (as in athlete's foot).   Swelling (edema).  SYMPTOMS  The legs are the most common site affected. Usually there is:  Redness.   Swelling.   Pain.   There may be red streaks in the area of the infection.  TREATMENT   Minor skin infections may be treated with topical antibiotics, but if the skin infection is severe, hospital care and intravenous (IV) antibiotic treatment may be needed.   Most often skin infections can be treated with oral antibiotic medicine as well as proper rest and elevation of the affected area until the infection improves.   If you are prescribed oral antibiotics, it is important to take them as directed and to take all the pills even if you feel better before you have finished all of the medicine.   You may apply warm compresses to the area for 20-30 minutes 4 times daily.  You might need a tetanus shot now if:  You have no idea when you had the last one.   You have never had a tetanus shot before.   Your wound had dirt in it.  If you need a tetanus shot and you decide not to get one, there is a rare chance of getting tetanus. Sickness from tetanus can be serious. If you get a tetanus shot, your arm may swell and become red and warm at the shot site. This is common and not a problem. SEEK MEDICAL CARE IF:  The pain and swelling from your infection do not improve within 2 days.  SEEK IMMEDIATE MEDICAL CARE IF:  You develop a fever, chills, or other serious problems.

## 2012-02-08 NOTE — ED Notes (Signed)
Pt has implanted piercing to R ring finger; pt rpeorts got it pierced over a year ago, but started working at warehouse recently and since then she has been having redness, drainage, pain at site

## 2012-02-08 NOTE — ED Notes (Signed)
Reviewed dischagre instructions, patient voiced understading

## 2012-02-08 NOTE — ED Notes (Signed)
Right ring would irrigated with NS, cleaned, Neosporin applied and dressing applied.  Reviewed discharge instructions with patient.

## 2012-07-11 IMAGING — US US ABDOMEN COMPLETE
1 series · 13 of 25 positions shown · non-contrast
Comparison: None

CLINICAL DATA: Abdominal pain

ABDOMINAL ULTRASOUND COMPLETE

[Series 1: us abdomen complete · 0.28mm/px · 13 of 47 slices shown]
[im 1/47]
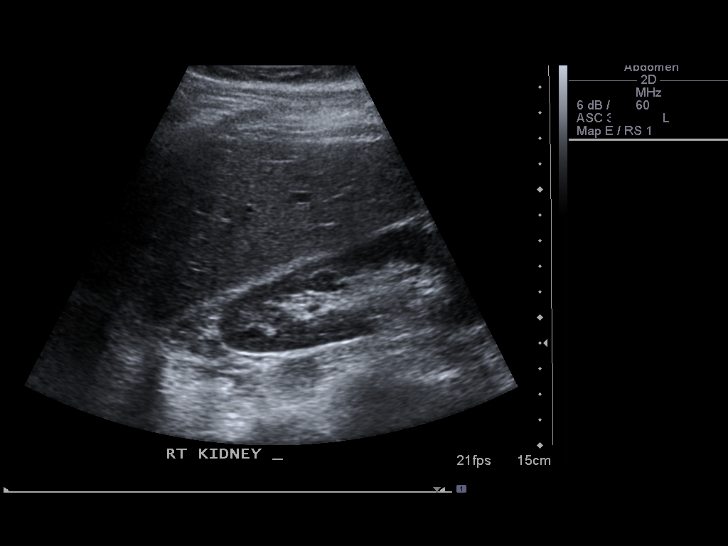
[im 4/47]
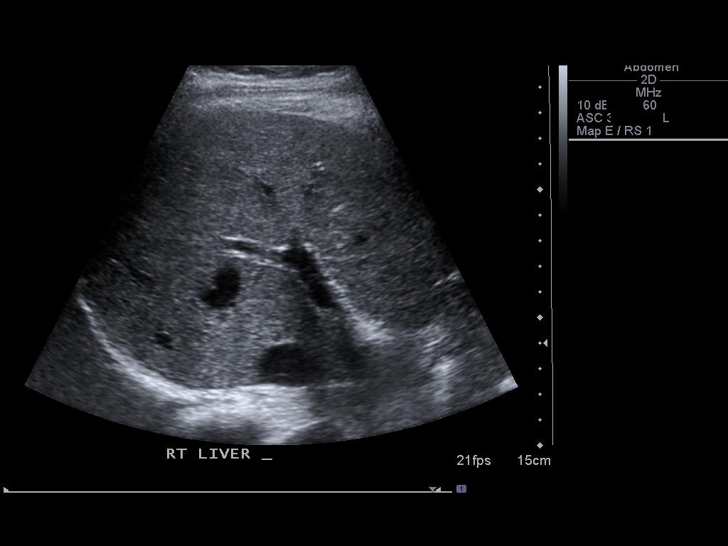
[im 8/47]
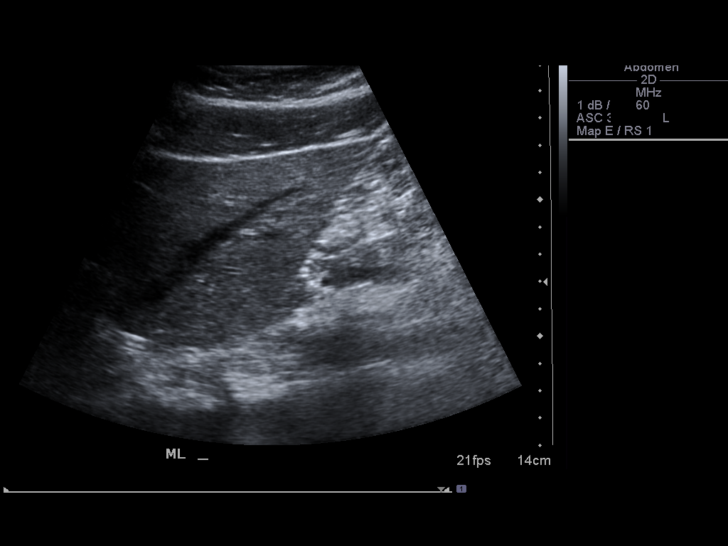
[im 12/47]
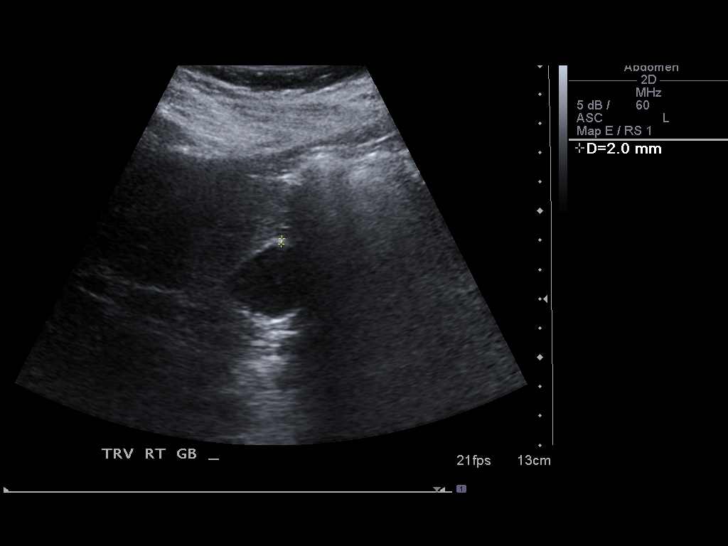
[im 16/47]
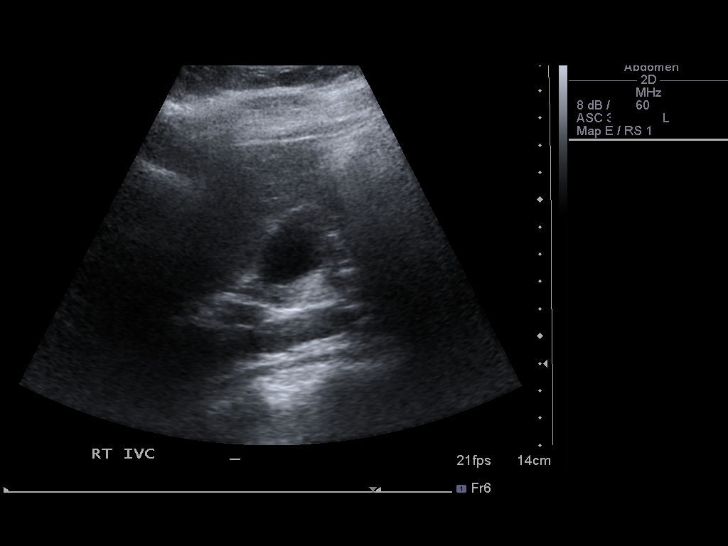
[im 20/47]
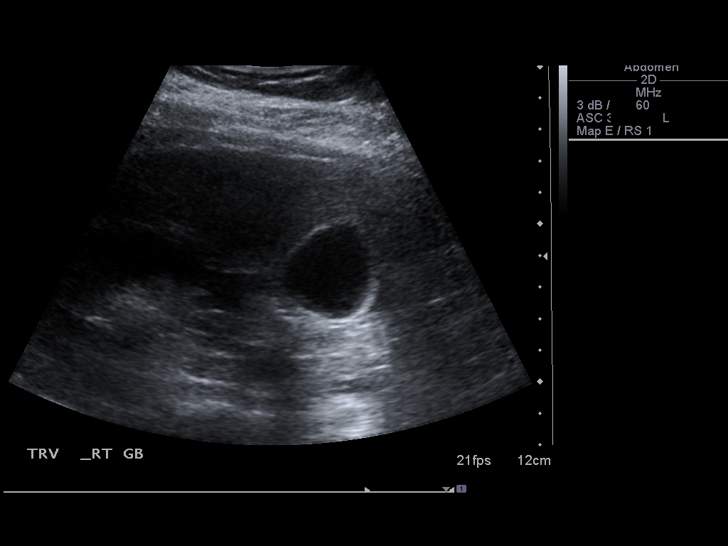
[im 24/47]
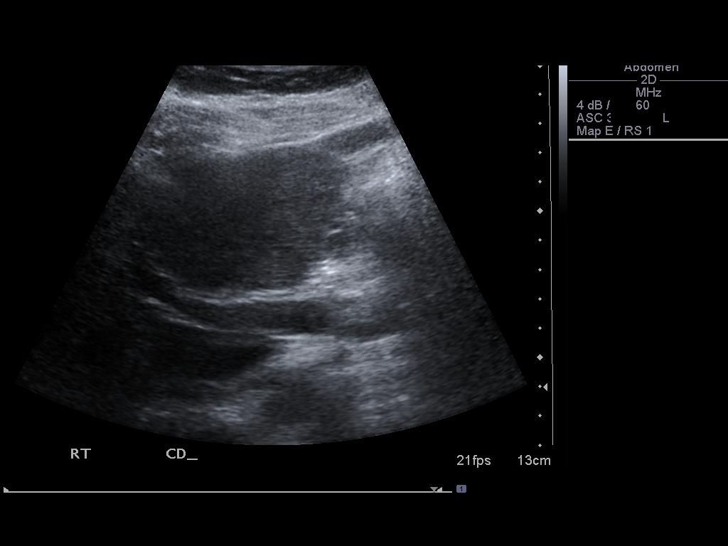
[im 27/47]
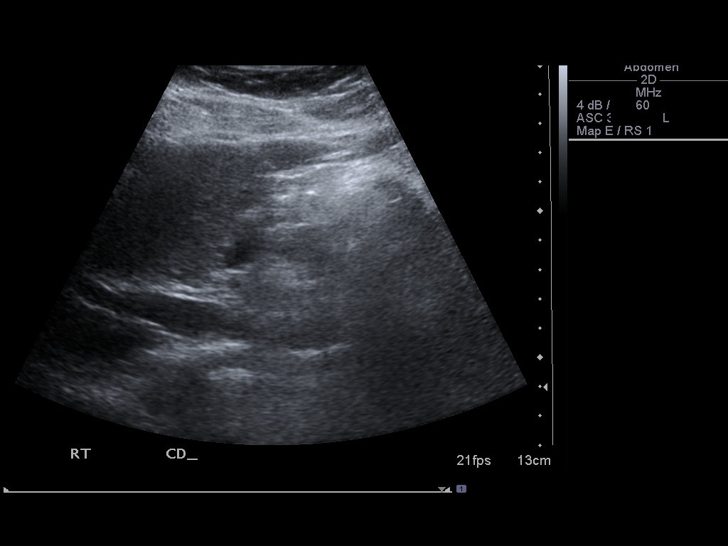
[im 31/47]
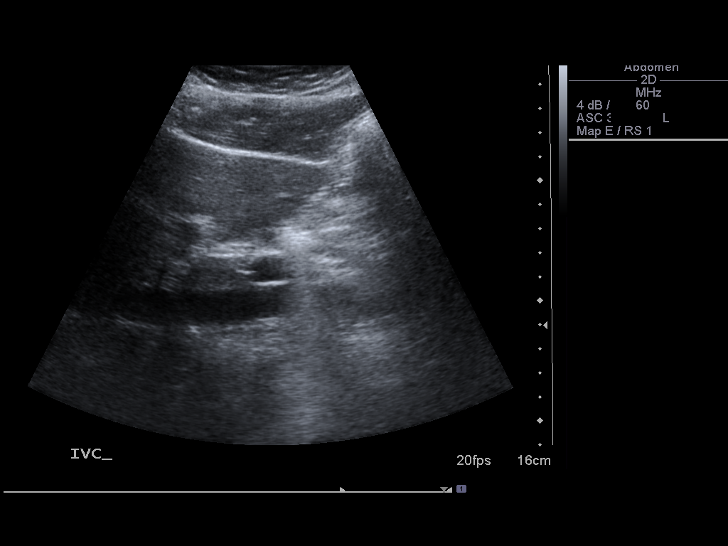
[im 35/47]
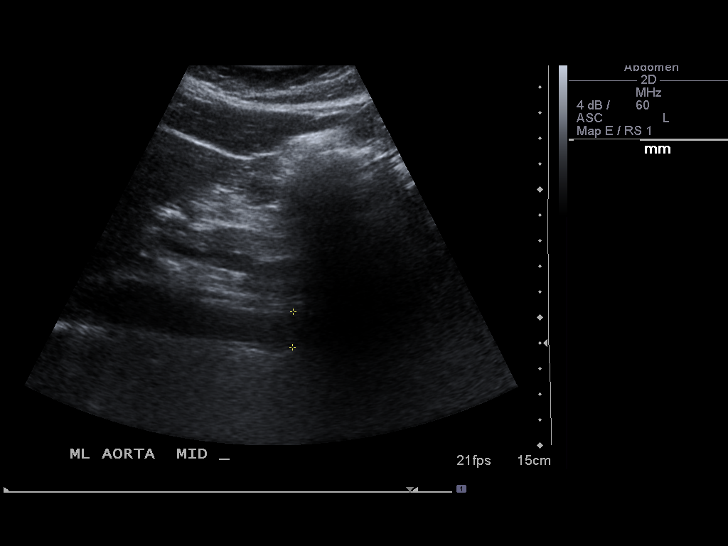
[im 39/47]
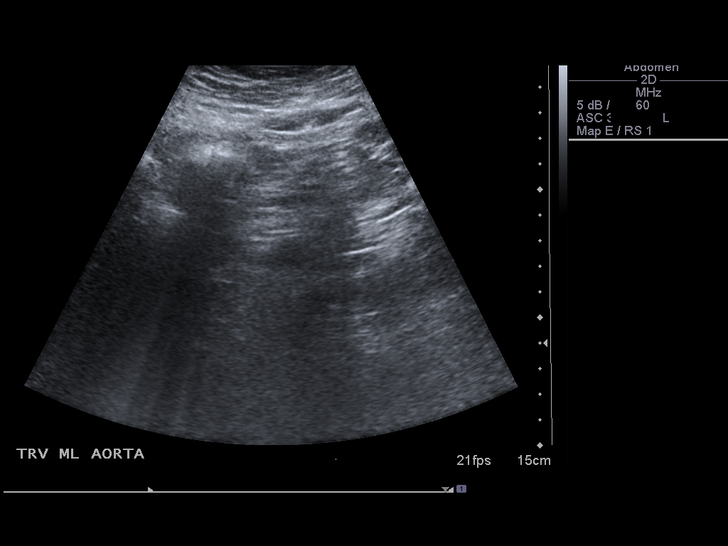
[im 43/47]
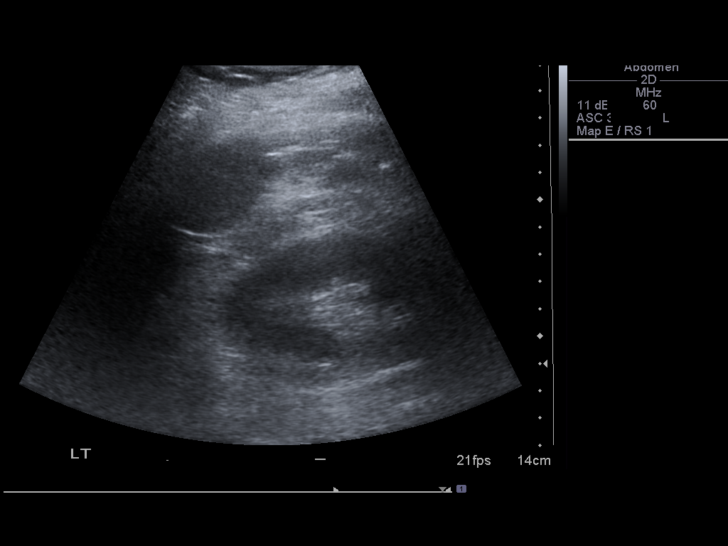
[im 47/47]
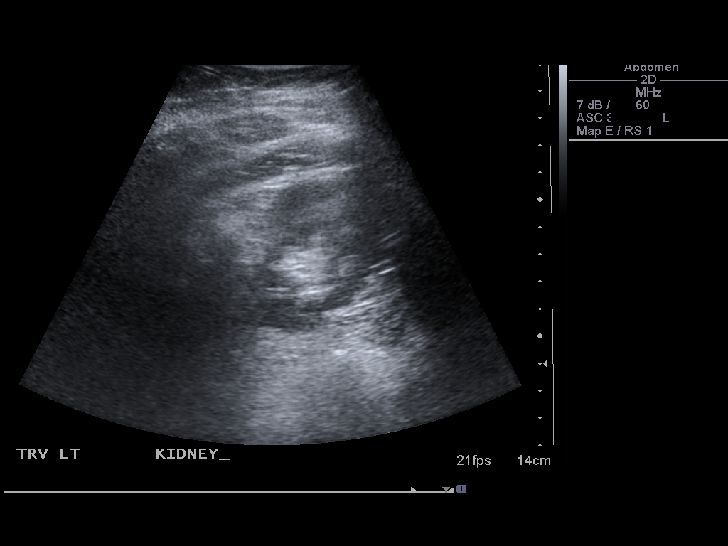

[13 of 25 positions shown; findings below may reference images not displayed]

FINDINGS: Gallbladder:  The gallbladder is unremarkable. There is no evidence
of gallstones, gallbladder wall thickening, or pericholecystic
fluid.

Common Bile Duct:  There is no evidence of intrahepatic or
extrahepatic biliary dilation. The CBD measures 2.6 mm in greatest
diameter.

Liver:  The liver is within normal limits in parenchymal
echogenicity. No focal abnormalities are identified.

IVC:  Appears normal.

Pancreas:  Although the pancreas is difficult to visualize in its
entirety, no focal pancreatic abnormality is identified.

Spleen:  Within normal limits in size and echotexture.

Right kidney:  The right kidney is normal in size and parenchymal
echogenicity.  There is no evidence of solid mass, hydronephrosis
or definite renal calculi.  The right kidney measures 10.3 cm.

Left kidney:  The left kidney is normal in size and parenchymal
echogenicity.  There is no evidence of solid mass, hydronephrosis
or definite renal calculi.   The left kidney measures 10.9 cm.

Abdominal Aorta:  No abdominal aortic aneurysm identified.

There is no evidence of ascites.
IMPRESSION: Negative abdominal ultrasound.

## 2014-11-23 ENCOUNTER — Inpatient Hospital Stay (HOSPITAL_COMMUNITY)
Admission: AD | Admit: 2014-11-23 | Discharge: 2014-11-23 | Discharge status: Against Medical Advice | Payer: Medicaid Other | Source: Ambulatory Visit | Attending: Obstetrics & Gynecology | Admitting: Obstetrics & Gynecology

## 2014-11-23 NOTE — MAU Note (Signed)
Called twice and not in lobby or radiology lobby

## 2014-11-23 NOTE — MAU Note (Signed)
Not in lobby when called 

## 2014-11-23 NOTE — MAU Note (Signed)
Pt called and not in lobby 

## 2015-06-10 ENCOUNTER — Encounter (HOSPITAL_COMMUNITY): Payer: Self-pay | Admitting: *Deleted

## 2015-06-10 ENCOUNTER — Inpatient Hospital Stay (HOSPITAL_COMMUNITY)
Admission: AD | Admit: 2015-06-10 | Discharge: 2015-06-10 | Disposition: A | Discharge status: Home / Self Care | Payer: Self-pay | Source: Ambulatory Visit | Attending: Obstetrics & Gynecology | Admitting: Obstetrics & Gynecology

## 2015-06-10 DIAGNOSIS — N970 Female infertility associated with anovulation: Secondary | ICD-10-CM

## 2015-06-10 DIAGNOSIS — N939 Abnormal uterine and vaginal bleeding, unspecified: Secondary | ICD-10-CM | POA: Insufficient documentation

## 2015-06-10 DIAGNOSIS — F17200 Nicotine dependence, unspecified, uncomplicated: Secondary | ICD-10-CM | POA: Insufficient documentation

## 2015-06-10 DIAGNOSIS — N926 Irregular menstruation, unspecified: Secondary | ICD-10-CM

## 2015-06-10 HISTORY — DX: Papillomavirus as the cause of diseases classified elsewhere: B97.7

## 2015-06-10 LAB — URINE MICROSCOPIC-ADD ON

## 2015-06-10 LAB — CBC
HEMATOCRIT: 39.7 % (ref 36.0–46.0)
HEMOGLOBIN: 13.1 g/dL (ref 12.0–15.0)
MCH: 31.6 pg (ref 26.0–34.0)
MCHC: 33 g/dL (ref 30.0–36.0)
MCV: 95.9 fL (ref 78.0–100.0)
PLATELETS: 325 10*3/uL (ref 150–400)
RBC: 4.14 MIL/uL (ref 3.87–5.11)
RDW: 12.9 % (ref 11.5–15.5)
WBC: 10 10*3/uL (ref 4.0–10.5)

## 2015-06-10 LAB — URINALYSIS, ROUTINE W REFLEX MICROSCOPIC
BILIRUBIN URINE: NEGATIVE
Glucose, UA: NEGATIVE mg/dL
KETONES UR: NEGATIVE mg/dL
Leukocytes, UA: NEGATIVE
Nitrite: NEGATIVE
PH: 6.5 (ref 5.0–8.0)
Protein, ur: NEGATIVE mg/dL
SPECIFIC GRAVITY, URINE: 1.01 (ref 1.005–1.030)
UROBILINOGEN UA: 0.2 mg/dL (ref 0.0–1.0)

## 2015-06-10 LAB — WET PREP, GENITAL
Trich, Wet Prep: NONE SEEN
YEAST WET PREP: NONE SEEN

## 2015-06-10 LAB — POCT PREGNANCY, URINE: Preg Test, Ur: NEGATIVE

## 2015-06-10 MED ORDER — NORGESTIMATE-ETH ESTRADIOL 0.25-35 MG-MCG PO TABS: 1.0000 | ORAL_TABLET | Freq: Every day | ORAL | Status: DC

## 2015-06-10 NOTE — MAU Provider Note (Signed)
Chief Complaint: Vaginal Bleeding   First Provider Initiated Contact with Patient 06/10/15 1309     SUBJECTIVE HPI: Michele Rich is a 29 y.o. [id number] who presents [facility] reporting vaginal bleeding x2.5 weeks- is using 6-7 tampons daily and is experiencing clot discharge. The patient denies dizziness, weakness, or syncope.   The patient reports she has experienced irregular menstrual cycles for the past year, and may go 2-3 months with out a cycle. Previous to this she has regularly had menstrual cycles every other month. The patient has not taken birth control for several years, but has been on depo-provera in the past. The patient reports this menstrual pattern began after her last pregnancy in 2012 during which she gained 6550lbs which she has been unable to lose.   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 The patient has not followed up as instructed, and does not have a regular GYN or PCP provider.   The patient reports going to a weight loss clinic in August where she was prescribed phentermine- the patient states she began this medication 5 days ago, and has not followed up with the clinic.     Past Medical History  Diagnosis Date  . HPV in female    OB History  Gravida Para Term Preterm AB SAB TAB Ectopic Multiple Living  3 2 2  1     2     # Outcome Date GA Lbr Len/2nd Weight Sex Delivery Anes PTL Lv  3 Term           2 Term           1 AB              Past Surgical History  Procedure Laterality Date  . No past surgeries     Social History   Social History  . Marital Status: Single    Spouse Name: N/A  . Number of Children: N/A  . Years of Education: N/A   Occupational History  . Not on file.   Social History Main Topics  . Smoking status: Current Every Day Smoker -- 0.25 packs/day  . Smokeless tobacco: Not on file  . Alcohol  Use: Yes     Comment: socially  . Drug Use: No  . Sexual Activity: Yes    Birth Control/ Protection: None   Other Topics Concern  . Not on file   Social History Narrative   No current facility-administered medications on file prior to encounter.   No current outpatient prescriptions on file prior to encounter.   Allergies  Allergen Reactions  . Apple Fruit Extract Hives    I have reviewed the past Medical Hx, Surgical Hx, Social Hx, Allergies and Medications.   Review of Systems  Constitutional: Positive for fatigue. Negative for fever, chills and appetite change (Reports historically poor appetite. ). Unexpected weight change: Patient reports 14 lb weight loss in the past month.  Respiratory: Negative for shortness of breath.   Cardiovascular: Negative for chest pain and palpitations.  Gastrointestinal: Negative for nausea, abdominal pain, diarrhea, constipation, blood in stool and abdominal distention.  Endocrine: Positive for cold intolerance. Negative for heat intolerance, polydipsia, polyphagia and polyuria.  Genitourinary: Positive for vaginal bleeding and menstrual problem (Irregular menstrual cycle x 1 year). Negative for dysuria, urgency, frequency, vaginal discharge and pelvic pain.  Skin: Negative for pallor.  Neurological: Negative  for dizziness, syncope, weakness and light-headedness.  Hematological: Does not bruise/bleed easily.  Psychiatric/Behavioral: Positive for sleep disturbance (Sleeping 12+ hours daily). Negative for decreased concentration. The patient is not nervous/anxious.   All other systems reviewed and are negative.   OBJECTIVE Patient Vitals for the past 24 hrs:  BP Temp Temp src Pulse Resp Height Weight  06/10/15 1239 122/75 mmHg 98 F (36.7 C) Oral 112 18 5' 1.25" (1.556 m) 191 lb 3.2 oz (86.728 kg)   Constitutional: Well-developed, well-nourished female in no acute distress.  Cardiovascular: Tachycardic, regular rhythm.  Respiratory: normal  rate and effort.  GI: Abd soft, non-tender, no masses. Pos BS x 4 MS: Extremities nontender, no edema, normal ROM Neurologic: Alert and oriented x 4. No tremors noted.   GU: Neg CVAT.  SPECULUM EXAM: NEFG, physiologic discharge, no blood noted, cervix clean, ectropion visualized.   BIMANUAL: cervix nontender; uterus normal size, no adnexal tenderness or masses.  No CMT.  LAB RESULTS Results for orders placed or performed during the hospital encounter of 06/10/15 (from the past 24 hour(s))  Urinalysis, Routine w reflex microscopic (not at [facility])     Status: Abnormal   Collection Time: 06/10/15 11:50 AM  Result Value Ref Range   Color, Urine STRAW (A) YELLOW   APPearance CLEAR CLEAR   Specific Gravity, Urine 1.010 1.005 - 1.030   pH 6.5 5.0 - 8.0   Glucose, UA NEGATIVE NEGATIVE mg/dL   Hgb urine dipstick SMALL (A) NEGATIVE   Bilirubin Urine NEGATIVE NEGATIVE   Ketones, ur NEGATIVE NEGATIVE mg/dL   Protein, ur NEGATIVE NEGATIVE mg/dL   Urobilinogen, UA 0.2 0.0 - 1.0 mg/dL   Nitrite NEGATIVE NEGATIVE   Leukocytes, UA NEGATIVE NEGATIVE  Urine microscopic-add on     Status: Abnormal   Collection Time: 06/10/15 11:50 AM  Result Value Ref Range   Squamous Epithelial / LPF FEW (A) RARE   RBC / HPF 0-2 <3 RBC/hpf   Bacteria, UA MANY (A) RARE  Pregnancy, urine POC     Status: None   Collection Time: 06/10/15 11:53 AM  Result Value Ref Range   Preg Test, Ur NEGATIVE NEGATIVE  CBC     Status: None   Collection Time: 06/10/15  1:03 PM  Result Value Ref Range   WBC 10.0 4.0 - 10.5 K/uL   RBC 4.14 3.87 - 5.11 MIL/uL   Hemoglobin 13.1 12.0 - 15.0 g/dL   HCT 16.1 09.6 - 04.5 %   MCV 95.9 78.0 - 100.0 fL   MCH 31.6 26.0 - 34.0 pg   MCHC 33.0 30.0 - 36.0 g/dL   RDW 40.9 81.1 - 91.4 %   Platelets 325 150 - 400 K/uL    IMAGING No results found.  MAU COURSE UPT UA CBC GC/Chlamydia  Wet prep    MDM The patient presents with with vaginal bleeding x 2.5 weeks consistent with  abnormal uterine bleeding possibly related to anovulatory cycles.  CBC rules out iron deficiency anemia, tachycardia confirmed to be trending down- suspected to be secondary to recent initiation of phentermine. UPT/UA/CBC unremarkable.  Pending results of GC/Chlamydia/wet prep.    ASSESSMENT Anovulatory bleeding   PLAN Discharge home in stable condition. Discussed follow up with GYN as well as PCP to further investigate possible PCOS or other contributory causes for anovulatory bleeding and recent symptoms, as well has for evaluation of hx related to high risk PAP results.  Will prescribe SPRINTEC 0.25-35 MG-MCG tablets for cycle control. Instructed the patient to stop  taking phentermine.  Provided resources for GYN and PCP providers for follow up.       [place], CNM 06/10/2015  2:15 PM

## 2015-06-10 NOTE — MAU Note (Signed)
Was having some d/c prior to cycle. Now cycle has been on for 2 1/2 wks.  Has hx of irreg cycles.

## 2015-06-11 LAB — GC/CHLAMYDIA PROBE AMP (~~LOC~~) NOT AT ARMC
Chlamydia: NEGATIVE
NEISSERIA GONORRHEA: NEGATIVE

## 2016-09-24 ENCOUNTER — Encounter (HOSPITAL_COMMUNITY): Payer: Self-pay | Admitting: *Deleted

## 2016-09-24 ENCOUNTER — Inpatient Hospital Stay (HOSPITAL_COMMUNITY)
Admission: AD | Admit: 2016-09-24 | Discharge: 2016-09-24 | Disposition: A | Discharge status: Home / Self Care | Payer: Medicaid Other | Source: Ambulatory Visit | Attending: Obstetrics and Gynecology | Admitting: Obstetrics and Gynecology

## 2016-09-24 DIAGNOSIS — Z79899 Other long term (current) drug therapy: Secondary | ICD-10-CM | POA: Diagnosis not present

## 2016-09-24 DIAGNOSIS — A599 Trichomoniasis, unspecified: Secondary | ICD-10-CM | POA: Diagnosis not present

## 2016-09-24 DIAGNOSIS — N898 Other specified noninflammatory disorders of vagina: Secondary | ICD-10-CM

## 2016-09-24 DIAGNOSIS — Z888 Allergy status to other drugs, medicaments and biological substances status: Secondary | ICD-10-CM | POA: Diagnosis not present

## 2016-09-24 DIAGNOSIS — F17210 Nicotine dependence, cigarettes, uncomplicated: Secondary | ICD-10-CM | POA: Insufficient documentation

## 2016-09-24 DIAGNOSIS — Z202 Contact with and (suspected) exposure to infections with a predominantly sexual mode of transmission: Secondary | ICD-10-CM

## 2016-09-24 LAB — WET PREP, GENITAL
CLUE CELLS WET PREP: NONE SEEN
Sperm: NONE SEEN
Yeast Wet Prep HPF POC: NONE SEEN

## 2016-09-24 LAB — URINALYSIS, DIPSTICK ONLY
BILIRUBIN URINE: NEGATIVE
Glucose, UA: NEGATIVE mg/dL
HGB URINE DIPSTICK: NEGATIVE
KETONES UR: 5 mg/dL — AB
NITRITE: NEGATIVE
Protein, ur: NEGATIVE mg/dL
SPECIFIC GRAVITY, URINE: 1.011 (ref 1.005–1.030)
pH: 6 (ref 5.0–8.0)

## 2016-09-24 LAB — POCT PREGNANCY, URINE: Preg Test, Ur: NEGATIVE

## 2016-09-24 MED ORDER — METRONIDAZOLE 500 MG PO TABS
2000.0000 mg | ORAL_TABLET | Freq: Once | ORAL | Status: AC
  Administered 2016-09-24: 2000 mg via ORAL
  Filled 2016-09-24: qty 4

## 2016-09-24 MED ORDER — AZITHROMYCIN 250 MG PO TABS
1000.0000 mg | ORAL_TABLET | Freq: Once | ORAL | Status: AC
  Administered 2016-09-24: 1000 mg via ORAL
  Filled 2016-09-24: qty 4

## 2016-09-24 MED ORDER — CEFTRIAXONE SODIUM 250 MG IJ SOLR
250.0000 mg | Freq: Once | INTRAMUSCULAR | Status: AC
  Administered 2016-09-24: 250 mg via INTRAMUSCULAR
  Filled 2016-09-24: qty 250

## 2016-09-24 NOTE — MAU Note (Addendum)
PT  SAYS   SHE  HAS CRAMPING  - STARTED  LAST Thursday.      LIGHT   VAG D/C- STARTED  LAST Thursday.      HAS TOOTH  ACHE -   X MTHS   SOMEONE  GAVE  HER  ANTX-        NO BIRTH  CONTROL.    LAST SEX- 3 WEEKS  AGO-    HE HAS  OTHER PARTNERS      TOOK  IBUPROFEN     600MG    AT 6PM.   ALSO TOOK ADVIL  THIS  AM

## 2016-09-24 NOTE — Discharge Instructions (Signed)
Trichomoniasis Trichomoniasis is an infection caused by an organism called Trichomonas. The infection can affect both women and men. In women, the outer female genitalia and the vagina are affected. In men, the penis is mainly affected, but the prostate and other reproductive organs can also be involved. Trichomoniasis is a sexually transmitted infection (STI) and is most often passed to another person through sexual contact.  RISK FACTORS  Having unprotected sexual intercourse.  Having sexual intercourse with an infected partner. SIGNS AND SYMPTOMS  Symptoms of trichomoniasis in women include:  Abnormal gray-green frothy vaginal discharge.  Itching and irritation of the vagina.  Itching and irritation of the area outside the vagina. Symptoms of trichomoniasis in men include:   Penile discharge with or without pain.  Pain during urination. This results from inflammation of the urethra. DIAGNOSIS  Trichomoniasis may be found during a Pap test or physical exam. Your health care provider may use one of the following methods to help diagnose this infection:  Testing the pH of the vagina with a test tape.  Using a vaginal swab test that checks for the Trichomonas organism. A test is available that provides results within a few minutes.  Examining a urine sample.  Testing vaginal secretions. Your health care provider may test you for other STIs, including HIV. TREATMENT   You may be given medicine to fight the infection. Women should inform their health care provider if they could be or are pregnant. Some medicines used to treat the infection should not be taken during pregnancy.  Your health care provider may recommend over-the-counter medicines or creams to decrease itching or irritation.  Your sexual partner will need to be treated if infected.  Your health care provider may test you for infection again 3 months after treatment. HOME CARE INSTRUCTIONS   Take medicines only as  directed by your health care provider.  Take over-the-counter medicine for itching or irritation as directed by your health care provider.  Do not have sexual intercourse while you have the infection.  Women should not douche or wear tampons while they have the infection.  Discuss your infection with your partner. Your partner may have gotten the infection from you, or you may have gotten it from your partner.  Have your sex partner get examined and treated if necessary.  Practice safe, informed, and protected sex.  See your health care provider for other STI testing. SEEK MEDICAL CARE IF:   You still have symptoms after you finish your medicine.  You develop abdominal pain.  You have pain when you urinate.  You have bleeding after sexual intercourse.  You develop a rash.  Your medicine makes you sick or makes you throw up (vomit). MAKE SURE YOU:  Understand these instructions.  Will watch your condition.  Will get help right away if you are not doing well or get worse. This information is not intended to replace advice given to you by your health care provider. Make sure you discuss any questions you have with your health care provider. Document Released: 01/26/2001 Document Revised: 08/23/2014 Document Reviewed: 05/14/2013 Elsevier Interactive Patient Education  2017 Elsevier Inc. Gonorrhea Gonorrhea is an infection that can cause serious problems. If left untreated, the infection may:   Damage the female or female organs.   Cause women to be unable to have children (sterility).   Harm a fetus if the infected woman is pregnant.  It is important to get treatment for gonorrhea as soon as possible. It is also necessary  that all your sexual partners be tested for the infection.  CAUSES  Gonorrhea is caused by bacteria called Neisseria gonorrhoeae. The infection is spread from person to person, usually by sexual contact (such as by anal, vaginal, or oral means). A  newborn can contract the infection from his or her mother during birth.  RISK FACTORS  Being a woman younger than 30 years of age who is sexually active.  Being a woman 12 years of age or older who has:  A new sex partner.  More than one sex partner.  A sex partner who has a sexually transmitted disease (STD).  Using condoms inconsistently.  Currently having, or having previously had, an STD.  Exchanging sex or money or drugs. SYMPTOMS  Some people with gonorrhea do not have symptoms. Symptoms may be different in females and males.  Females The most common symptoms are:   Pain in the lower abdomen.   Fever with or without chills.  Other symptoms include:   Abnormal vaginal discharge.   Painful intercourse.   Burning or itching of the vagina or lips of the vagina.   Abnormal vaginal bleeding.   Pain when urinating.   Long-lasting (chronic) pain in the lower abdomen, especially during menstruation or intercourse.   Inability to become pregnant.   Going into premature labor.   Irritation, pain, bleeding, or discharge from the rectum. This may occur if the infection was spread by anal sex.   Sore throat or swollen lymph nodes in the neck. This may occur if the infection was spread by oral sex.  Males The most common symptoms are:   Discharge from the penis.   Pain or burning during urination.   Pain or swelling in the testicles. Other symptoms may include:   Irritation, pain, bleeding, or discharge from the rectum. This may occur if the infection was spread by anal sex.   Sore throat, fever, or swollen lymph nodes in the neck. This may occur if the infection was spread by oral sex.  DIAGNOSIS  A diagnosis is made after a physical exam is done and a sample of discharge is examined under a microscope for the presence of the bacteria. The discharge may be taken from the urethra, cervix, throat, or rectum.  TREATMENT  Gonorrhea is treated with  antibiotic medicines. It is important for treatment to begin as soon as possible. Early treatment may prevent some problems from developing. Do not have sex. Avoid all types of sexual activity for 7 days after treatment is complete and until any sex partners have been treated. HOME CARE INSTRUCTIONS   Take medicines only as directed by your health care provider.   Take your antibiotic medicine as directed by your health care provider. Finish the antibiotic even if you start to feel better. Incomplete treatment will put you at risk for continued infection.   Do not have sex until treatment is complete or as directed by your health care provider.   Keep all follow-up visits as directed by your health care provider.   Not all test results are available during your visit. If your test results are not back during the visit, make an appointment with your health care provider to find out the results. Do not assume everything is normal if you have not heard from your health care provider or the medical facility. It is your responsibility to get your test results.  If you test positive for gonorrhea, inform your recent sexual partners. They need to be checked  for gonorrhea even if they do not have symptoms. They may need treatment, even if they test negative for gonorrhea.  SEEK MEDICAL CARE IF:   You develop any bad reaction to the medicine you were prescribed. This may include:   A rash.   Nausea.   Vomiting.   Diarrhea.   Your symptoms do not improve after a few days of taking antibiotics.   Your symptoms get worse.   You develop increased pain, such as in the testicles (for males) or in the abdomen (for females).  You have a fever. MAKE SURE YOU:   Understand these instructions.  Will watch your condition.  Will get help right away if you are not doing well or get worse. This information is not intended to replace advice given to you by your health care provider. Make  sure you discuss any questions you have with your health care provider. Document Released: 07/30/2000 Document Revised: 08/23/2014 Document Reviewed: 02/07/2013 Elsevier Interactive Patient Education  2017 Elsevier Inc.  Chlamydia, Female Chlamydia is an infection. It is spread through sexual contact. Chlamydia can be in different areas of the body. These areas include the cervix, urethra, throat, or rectum. You may not know you have chlamydia because many people never develop the symptoms. Chlamydia is not difficult to treat once you know you have it. However, if it is left untreated, chlamydia can lead to more serious health problems.  CAUSES  Chlamydia is caused by bacteria. It is a sexually transmitted disease. It is passed from an infected partner during intimate contact. This contact could be with the genitals, mouth, or rectal area. Chlamydia can also be passed from mothers to babies during birth. SIGNS AND SYMPTOMS  There may not be any symptoms. This is often the case early in the infection. If symptoms develop, they may include:  Mild pain and discomfort when urinating.  Redness, soreness, and swelling (inflammation) of the rectum.  Vaginal discharge.  Painful intercourse.  Abdominal pain.  Bleeding between menstrual periods. DIAGNOSIS  To diagnose this infection, your health care provider will do a pelvic exam. Cultures will be taken of the vagina, cervix, urine, and possibly the rectum to verify the diagnosis.  TREATMENT You will be given antibiotic medicines. If you are pregnant, certain types of antibiotics will need to be avoided. Any sexual partners should also be treated, even if they do not show symptoms. Your health care provider may test you for infection again 3 months after treatment. HOME CARE INSTRUCTIONS   Take your antibiotic medicine as directed by your health care provider. Finish the antibiotic even if you start to feel better.  Take medicines only as  directed by your health care provider.  Inform any sexual partners about the infection. They should also be treated.  Do not have sexual contact until your health care provider tells you it is okay.  Get plenty of rest.  Eat a well-balanced diet.  Drink enough fluids to keep your urine clear or pale yellow.  Keep all follow-up visits as directed by your health care provider. SEEK MEDICAL CARE IF:  You have painful urination.  You have abdominal pain.  You have vaginal discharge.  You have painful sexual intercourse.  You have bleeding between periods and after sex.  You have a fever. SEEK IMMEDIATE MEDICAL CARE IF:   You experience nausea or vomiting.  You experience excessive sweating (diaphoresis).  You have difficulty swallowing. This information is not intended to replace advice given to you  by your health care provider. Make sure you discuss any questions you have with your health care provider. Document Released: 05/12/2005 Document Revised: 04/23/2015 Document Reviewed: 04/09/2013 Elsevier Interactive Patient Education  2017 [employer].

## 2016-09-24 NOTE — MAU Provider Note (Signed)
History     CSN: [id number]  Arrival date and time: 09/24/16 0019   First Provider Initiated Contact with Patient 09/24/16 0112      Chief Complaint  Patient presents with  . Vaginal Discharge   Michele Rich is a 31 y.o. [id number] who presents today with vaginal discharge. She reports that her boyfriend also has another girlfriend, and the patient was told that the other girlfriend has gonorrhea. She would like treatement today.    Vaginal Discharge  The patient's primary symptoms include vaginal discharge. This is a new problem. The current episode started in the past 7 days. The problem occurs constantly. The problem has been unchanged. The pain is mild. The problem affects both sides. She is not pregnant. The vaginal discharge was thick and white. There has been no bleeding. Nothing aggravates the symptoms. She has tried nothing for the symptoms. Her menstrual history has been regular (LMP: 08/02/16 ).   Past Medical History:  Diagnosis Date  . HPV in female     Past Surgical History:  Procedure Laterality Date  . NO PAST SURGERIES      No family history on file.  Social History  Substance Use Topics  . Smoking status: Current Every Day Smoker    Packs/day: 0.25  . Smokeless tobacco: Not on file  . Alcohol use Yes     Comment: socially    Allergies:  Allergies  Allergen Reactions  . Apple Fruit Extract Hives    Prescriptions Prior to Admission  Medication Sig Dispense Refill Last Dose  . norgestimate-ethinyl estradiol (ORTHO-CYCLEN,SPRINTEC,PREVIFEM) 0.25-35 MG-MCG tablet Take 1 tablet by mouth daily. 1 Package 1     Review of Systems  Genitourinary: Positive for vaginal discharge.   Physical Exam   Blood pressure 133/85, pulse 92, temperature 98.8 F (37.1 C), temperature source Oral, height 5\' 5"  (1.651 m), weight 177 lb 8 oz (80.5 kg), last menstrual period 08/02/2016.  Physical Exam  Nursing note and vitals reviewed. Constitutional: She is  oriented to person, place, and time. She appears well-developed and well-nourished. No distress.  HENT:  Head: Normocephalic.  Cardiovascular: Normal rate.   Respiratory: Effort normal.  GI: Soft. There is no tenderness. There is no rebound.  Musculoskeletal: Normal range of motion.  Neurological: She is alert and oriented to person, place, and time.  Skin: Skin is warm and dry.  Psychiatric: She has a normal mood and affect.   Results for orders placed or performed during the hospital encounter of 09/24/16 (from the past 24 hour(s))  Wet prep, genital     Status: Abnormal   Collection Time: 09/24/16 12:55 AM  Result Value Ref Range   Yeast Wet Prep HPF POC NONE SEEN NONE SEEN   Trich, Wet Prep PRESENT (A) NONE SEEN   Clue Cells Wet Prep HPF POC NONE SEEN NONE SEEN   WBC, Wet Prep HPF POC FEW (A) NONE SEEN   Sperm NONE SEEN   Urinalysis, dipstick only     Status: Abnormal   Collection Time: 09/24/16 12:55 AM  Result Value Ref Range   Color, Urine YELLOW YELLOW   APPearance CLEAR CLEAR   Specific Gravity, Urine 1.011 1.005 - 1.030   pH 6.0 5.0 - 8.0   Glucose, UA NEGATIVE NEGATIVE mg/dL   Hgb urine dipstick NEGATIVE NEGATIVE   Bilirubin Urine NEGATIVE NEGATIVE   Ketones, ur 5 (A) NEGATIVE mg/dL   Protein, ur NEGATIVE NEGATIVE mg/dL   Nitrite NEGATIVE NEGATIVE   Leukocytes, UA  SMALL (A) NEGATIVE  Pregnancy, urine POC     Status: None   Collection Time: 09/24/16  1:07 AM  Result Value Ref Range   Preg Test, Ur NEGATIVE NEGATIVE     MAU Course  Procedures  MDM Given flagyl, rocephin and azithromycin here today.   Assessment and Plan   1. Trichomoniasis   2. Exposure to sexually transmitted disease (STD)   3. Vaginal discharge    DC home Comfort measures reviewed  GC/CT pending  RX: none, treated here today in MAU  Return to MAU as needed FU with OB as planned  Follow-up Information    [facility].   Contact information: [address] [place] [id number] [phone]           Tawnya CrookHogan, Tyrice Hewitt Donovan 09/24/2016, 1:14 AM

## 2016-09-27 LAB — GC/CHLAMYDIA PROBE AMP (~~LOC~~) NOT AT ARMC
CHLAMYDIA, DNA PROBE: NEGATIVE
Neisseria Gonorrhea: NEGATIVE

## 2017-05-03 ENCOUNTER — Encounter (HOSPITAL_COMMUNITY): Payer: Self-pay

## 2017-05-03 DIAGNOSIS — R945 Abnormal results of liver function studies: Secondary | ICD-10-CM | POA: Diagnosis not present

## 2017-05-03 DIAGNOSIS — Z79899 Other long term (current) drug therapy: Secondary | ICD-10-CM | POA: Insufficient documentation

## 2017-05-03 DIAGNOSIS — F17200 Nicotine dependence, unspecified, uncomplicated: Secondary | ICD-10-CM | POA: Diagnosis not present

## 2017-05-03 DIAGNOSIS — R112 Nausea with vomiting, unspecified: Secondary | ICD-10-CM | POA: Diagnosis present

## 2017-05-03 DIAGNOSIS — F1010 Alcohol abuse, uncomplicated: Secondary | ICD-10-CM | POA: Diagnosis not present

## 2017-05-03 LAB — CBC
HCT: 41.2 % (ref 36.0–46.0)
Hemoglobin: 13.9 g/dL (ref 12.0–15.0)
MCH: 33.7 pg (ref 26.0–34.0)
MCHC: 33.7 g/dL (ref 30.0–36.0)
MCV: 100 fL (ref 78.0–100.0)
PLATELETS: 277 10*3/uL (ref 150–400)
RBC: 4.12 MIL/uL (ref 3.87–5.11)
RDW: 13.5 % (ref 11.5–15.5)
WBC: 6.2 10*3/uL (ref 4.0–10.5)

## 2017-05-03 LAB — COMPREHENSIVE METABOLIC PANEL
ALBUMIN: 4.2 g/dL (ref 3.5–5.0)
ALK PHOS: 86 U/L (ref 38–126)
ALT: 104 U/L — AB (ref 14–54)
ANION GAP: 10 (ref 5–15)
AST: 252 U/L — ABNORMAL HIGH (ref 15–41)
BUN: 10 mg/dL (ref 6–20)
CALCIUM: 8.7 mg/dL — AB (ref 8.9–10.3)
CO2: 24 mmol/L (ref 22–32)
CREATININE: 0.86 mg/dL (ref 0.44–1.00)
Chloride: 104 mmol/L (ref 101–111)
GFR calc Af Amer: >60 mL/min (ref >60)
GFR calc non Af Amer: >60 mL/min (ref >60)
GLUCOSE: 96 mg/dL (ref 65–99)
Potassium: 3.9 mmol/L (ref 3.5–5.1)
Sodium: 138 mmol/L (ref 135–145)
TOTAL PROTEIN: 8.3 g/dL — AB (ref 6.5–8.1)
Total Bilirubin: 0.8 mg/dL (ref 0.3–1.2)

## 2017-05-03 LAB — URINALYSIS, ROUTINE W REFLEX MICROSCOPIC
Bilirubin Urine: NEGATIVE
GLUCOSE, UA: NEGATIVE mg/dL
Ketones, ur: NEGATIVE mg/dL
Leukocytes, UA: NEGATIVE
NITRITE: NEGATIVE
PROTEIN: 30 mg/dL — AB
SPECIFIC GRAVITY, URINE: 1.029 (ref 1.005–1.030)
pH: 5 (ref 5.0–8.0)

## 2017-05-03 LAB — LIPASE, BLOOD: Lipase: 33 U/L (ref 11–51)

## 2017-05-03 NOTE — ED Triage Notes (Signed)
Pt states that I just dont feel good, pt states she has vomited about 15 times in the past 24 hours. Denies fevers, no diarrhea. Some abd pain on the r side. Pt reports heavy ETOH over the weekend.

## 2017-05-04 ENCOUNTER — Emergency Department (HOSPITAL_COMMUNITY): Payer: Medicaid Other

## 2017-05-04 ENCOUNTER — Emergency Department (HOSPITAL_COMMUNITY)
Admission: EM | Admit: 2017-05-04 | Discharge: 2017-05-04 | Disposition: A | Discharge status: Home / Self Care | Payer: Medicaid Other | Attending: Emergency Medicine | Admitting: Emergency Medicine

## 2017-05-04 DIAGNOSIS — R112 Nausea with vomiting, unspecified: Secondary | ICD-10-CM

## 2017-05-04 DIAGNOSIS — R740 Nonspecific elevation of levels of transaminase and lactic acid dehydrogenase [LDH]: Secondary | ICD-10-CM

## 2017-05-04 DIAGNOSIS — R7401 Elevation of levels of liver transaminase levels: Secondary | ICD-10-CM

## 2017-05-04 DIAGNOSIS — R7989 Other specified abnormal findings of blood chemistry: Secondary | ICD-10-CM

## 2017-05-04 DIAGNOSIS — F1010 Alcohol abuse, uncomplicated: Secondary | ICD-10-CM

## 2017-05-04 DIAGNOSIS — R945 Abnormal results of liver function studies: Secondary | ICD-10-CM

## 2017-05-04 LAB — RAPID URINE DRUG SCREEN, HOSP PERFORMED
AMPHETAMINES: NOT DETECTED
BENZODIAZEPINES: NOT DETECTED
Barbiturates: NOT DETECTED
COCAINE: NOT DETECTED
OPIATES: NOT DETECTED
TETRAHYDROCANNABINOL: NOT DETECTED

## 2017-05-04 LAB — I-STAT BETA HCG BLOOD, ED (MC, WL, AP ONLY): I-stat hCG, quantitative: <5 m[IU]/mL (ref <5)

## 2017-05-04 MED ORDER — PROMETHAZINE HCL 25 MG/ML IJ SOLN
25.0000 mg | Freq: Once | INTRAMUSCULAR | Status: AC
  Administered 2017-05-04: 25 mg via INTRAVENOUS
  Filled 2017-05-04: qty 1

## 2017-05-04 MED ORDER — ONDANSETRON 4 MG PO TBDP: 4.0000 mg | ORAL_TABLET | Freq: Three times a day (TID) | ORAL | 0 refills | Status: DC | PRN

## 2017-05-04 MED ORDER — SODIUM CHLORIDE 0.9 % IV BOLUS (SEPSIS)
1000.0000 mL | Freq: Once | INTRAVENOUS | Status: AC
  Administered 2017-05-04: 1000 mL via INTRAVENOUS

## 2017-05-04 MED ORDER — ONDANSETRON HCL 4 MG/2ML IJ SOLN
4.0000 mg | Freq: Once | INTRAMUSCULAR | Status: AC
  Administered 2017-05-04: 4 mg via INTRAVENOUS
  Filled 2017-05-04: qty 2

## 2017-05-04 MED ORDER — OMEPRAZOLE 20 MG PO CPDR: 20.0000 mg | DELAYED_RELEASE_CAPSULE | Freq: Every day | ORAL | 0 refills | Status: DC

## 2017-05-04 MED ORDER — SODIUM CHLORIDE 0.9 % IV BOLUS (SEPSIS): 1000.0000 mL | Freq: Once | INTRAVENOUS | Status: DC

## 2017-05-04 NOTE — ED Notes (Signed)
Pt oob to bathroom with steady gait. 

## 2017-05-04 NOTE — ED Notes (Signed)
Pt states she understands instrucrtions. Home stable with steady gait. 

## 2017-05-04 NOTE — ED Provider Notes (Signed)
On repeat exam the patient's heart rate is between 101 105, she has strong pulses, no abdominal tenderness whatsoever and states that she is feeling better. She has been given several liters of IV fluids, I discussed all of her results with her including her elevated transaminitis. This is in a pattern consistent with alcohol abuse. I have discussed her alcohol abuse with her and will give her a list of resources for outpatient follow-up for chronic treatment. The patient is in agreement, states she feels better and is ready for discharge.   Eber Hong, MD 05/04/17 [phone]

## 2017-05-04 NOTE — Discharge Instructions (Addendum)
Reduce your alcohol intake. Follow up with a  primary doctor. Return to the ED if you develop new or worsening symptoms.   Substance Abuse Treatment Programs  Intensive Outpatient Programs [facility]     [address]      [place], [place]                   [phone]       [facility] [address] [place] [address] [place], [place] [phone]  [facility]     (Inpatient and outpatient)     [address]           [phone]    [facility] [phone] (Suboxone and Methadone)  [address]      [place], [place] [address]      [phone]       [address] [place], [place] [phone]  [facility] (Outpatient/Inpatient, [employer])    (insurance only) [phone]             [facility] (Groups & Residential) [place], [place] [phone]     [facility]     [address]     [place], [place]      [phone]       [facility] (for caregivers and family) [phone] [address] [place], [place] [phone]      Residential Treatment Programs [facility]      [address] [place], [place] [address]  [phone]       [facility] [address] [place], [place] [address] [phone]  [facility] [place]        [phone]       [facility] [phone]  [facility] ([facility]             [address]                                         [place], [place]                                                [phone] or [phone]                               [facility] [address] [place], [address] [phone]  [facility]    [address]      [place], [place]     [phone]       [facility] [address] [place], [place] [phone]  [facility]   [address] [place], [place]  [address]     [phone]      Admissions: 8am-3pm M-F  [facility] ([facility]) [address] [place], [place] [phone]  [facility]: Residential Program [phone] Days)   [place], [place]      [phone] or [phone]     [facility]: [facility] [place], [place] (Walk in Hours over the weekend or by referral)  [facility] [address] [place], [place], [place] [address] [phone]  Crisis Mobile: [facility]:  [phone] (for  crisis response 24 hours a day) [facility] Hotline:      [phone] Outpatient Psychiatry and Counseling  [facility]: Mobile Crisis Management 24 hours:  [phone]  [facility] of [facility] [employer] sliding scale fee and walk in schedule: M-F 8am-12pm/1pm-3pm [address]  [place], [place] [address] [phone]  [facility] [address] [place], [place] [address] [phone]  [facility] ([facility] [employer])- new patient walk-in appointments available Monday - Friday 8am -3pm.          [address] [place], [place] [address] [phone] or crisis line- [phone]  [facility]/ Intensive Outpatient Therapy Program [address] [place], [place] [address] [phone]  [facility]                  Crisis Services      [phone] [address]     [place], [place] [address]                 [facility]   [facility] [phone]. [address] [place], [place] [address]   [employer] of Care          [address] Laura  [place], [place] [address]       [phone]  [facility] [address] [place], [place] [address] [phone]  [facility]    [address]    [place], [place] [address]     [phone]       Andee Poles,  MD     [address]     [place] [address]     [phone]       [facility] [address] [place] [address]  [facility]     [address] [place], [place]      [phone]       [facility] Eulogio Ditch, MD [address] [place], [place] [address] [phone]  [facility]     [address]     [place], [place] [address]     [phone]       [facility] [address] [place], [place] [address] [phone] Resources for Temporary Residential Assistance/Crisis Centers  [facility]ENT[facility]) M-F 8am-3pm   [address] [place], [place] [address]   [phone] Services include: laundry, barbering, support groups, case management, phone  & computer access, showers, AA/NA mtgs, mental health/substance abuse nurse, job skills class, disability information, VA assistance, spiritual classes, etc.   HOMELESS SHELTERS  [facility]     [facility]   [address] [place]     [phone]              [employer] (women and children)       [address] [place], [place] [address] [phone] [email or web address] for application and process Application Required  [facility]   [address]    [place] [address]     [phone]                    [facility] [place] [address] [place], [place] [address] [phone] application appt.) Application Required  [employer] (women only)  [place], [place] [address]     [phone]      Intake starts 6pm daily Need valid ID, SSC, & Police report [facility] [address] [place], [place] [phone] Application Required  [employer] (men only)     [place]      [place],  [place]       [place] (Pregnant women only) [address] [place], [place]  [facility]      [place] [address].      [place], [place] [address]     [phone]             [facility] [address] [place], [place] 90 day commitment/SA/Application process  [facility](men only)     [address]     [place], [place]       Check-in at [facility] [address] [place],  [address] [phone] Men/Women/Women and Children must be there by 7 pm  [place], [place]

## 2017-05-04 NOTE — ED Provider Notes (Signed)
[facility] Provider Note   CSN: [id number] Arrival date & time: 05/03/17  1939     History   Chief Complaint Chief Complaint  Patient presents with  . Emesis    HPI Michele Rich is a 31 y.o. female.   Patient complains of nausea and vomiting intermittently for the past 5 days. States she vomited about 5 times yesterday. Emesis has been clear and non-bloody and nonbilious. No diarrhea. No abdominal pain. Patient has not had this kind of discomfort before. She does admit to drinking alcohol on a daily basis states she usually has one or 2 glasses of wine or beer but may have more. She does have shakiness and she does not drink daily. Denies any other drug use. Denies any marijuana use. No previous abdominal surgeries. No pain with urination or dysuria. Last menstrual cycle one month ago. No vaginal bleeding or discharge.   The history is provided by the patient.  Emesis   Associated symptoms include abdominal pain. Pertinent negatives include no arthralgias, no cough, no fever, no headaches and no myalgias.    Past Medical History:  Diagnosis Date  . HPV in female     There are no active problems to display for this patient.   Past Surgical History:  Procedure Laterality Date  . NO PAST SURGERIES      OB History    Gravida Para Term Preterm AB Living   SAB TAB Ectopic Multiple Live Births                   Home Medications    Prior to Admission medications   Medication Sig Start Date End Date Taking? Authorizing Provider  norgestimate-ethinyl estradiol (ORTHO-CYCLEN,SPRINTEC,PREVIFEM) 0.25-35 MG-MCG tablet Take 1 tablet by mouth daily. 06/10/15   Dorathy Kinsman, CNM    Family History No family history on file.  Social History Social History  Substance Use Topics  . Smoking status: Current Every Day Smoker    Packs/day: 0.25  . Smokeless tobacco: Never Used  . Alcohol use Yes     Comment: socially     Allergies   Apple  fruit extract   Review of Systems Review of Systems  Constitutional: Positive for activity change, appetite change and fatigue. Negative for fever.  HENT: Negative for congestion.   Respiratory: Negative for cough, chest tightness and shortness of breath.   Cardiovascular: Negative for chest pain.  Gastrointestinal: Positive for abdominal pain, nausea and vomiting.  Genitourinary: Negative for dysuria, hematuria, vaginal bleeding and vaginal discharge.  Musculoskeletal: Negative for arthralgias and myalgias.  Neurological: Negative for dizziness, weakness and headaches.    all other systems are negative except as noted in the HPI and PMH.    Physical Exam Updated Vital Signs BP 130/81 (BP Location: Right Arm)   Pulse 63   Temp 98.7 F (37.1 C) (Oral)   Resp 20   Ht  (1.676 m)   Wt 86.2 kg (190 lb)   LMP 04/06/2017   SpO2 95%   BMI 30.67 kg/m   Physical Exam  Constitutional: She is oriented to person, place, and time. She appears well-developed and well-nourished. No distress.  Smiling. Chips and mountain dew in room  HENT:  Head: Normocephalic and atraumatic.  Mouth/Throat: Oropharynx is clear and moist. No oropharyngeal exudate.  Moist mucus membranes  Eyes: Pupils are equal, round, and reactive to light. Conjunctivae and EOM are normal.  Neck: Normal range  of motion. Neck supple.  No meningismus.  Cardiovascular: Normal rate, regular rhythm, normal heart sounds and intact distal pulses.   No murmur heard. Pulmonary/Chest: Effort normal and breath sounds normal. No respiratory distress.  Abdominal: Soft. There is no tenderness. There is no rebound and no guarding.  Musculoskeletal: Normal range of motion. She exhibits no edema or tenderness.  Neurological: She is alert and oriented to person, place, and time. No cranial nerve deficit. She exhibits normal muscle tone. Coordination normal.   5/5 strength throughout. CN 2-12 intact.Equal grip strength.   Skin: Skin  is warm. Capillary refill takes less than 2 seconds.  Psychiatric: She has a normal mood and affect. Her behavior is normal.  Nursing note and vitals reviewed.    ED Treatments / Results  Labs (all labs ordered are listed, but only abnormal results are displayed) Labs Reviewed  COMPREHENSIVE METABOLIC PANEL - Abnormal; Notable for the following:       Result Value   Calcium 8.7 (*)    Total Protein 8.3 (*)    AST 252 (*)    ALT 104 (*)    All other components within normal limits  URINALYSIS, ROUTINE W REFLEX MICROSCOPIC - Abnormal; Notable for the following:    Color, Urine AMBER (*)    Hgb urine dipstick SMALL (*)    Protein, ur 30 (*)    Bacteria, UA RARE (*)    Squamous Epithelial / LPF 0-5 (*)    All other components within normal limits  LIPASE, BLOOD  CBC  ETHANOL  RAPID URINE DRUG SCREEN, HOSP PERFORMED  I-STAT BETA HCG BLOOD, ED ([facility], [facility], [facility] ONLY)    EKG  EKG Interpretation None       Radiology US Abdomen Limited Ruq  Result Date: 05/04/2017 CLINICAL DATA:  Elevated liver function studies. EXAM: ULTRASOUND ABDOMEN LIMITED RIGHT UPPER QUADRANT COMPARISON:  12/06/2010 FINDINGS: Gallbladder: No gallstones or wall thickening visualized. No sonographic Murphy sign noted by sonographer. Common bile duct: Diameter: 2 mm, normal Liver: Diffusely increased liver parenchymal echotexture consistent with fatty infiltration. No focal lesions appreciated. Portal vein is patent on color Doppler imaging with normal direction of blood flow towards the liver. IMPRESSION: Diffuse fatty infiltration of the liver. No evidence of cholelithiasis or cholecystitis. Electronically Signed   By: Burman Nieves M.D.   On: 05/04/2017 05:42    Procedures Procedures (including critical care time)  Medications Ordered in ED Medications  sodium chloride 0.9 % bolus 1,000 mL (not administered)  sodium chloride 0.9 % bolus 1,000 mL (not administered)  ondansetron (ZOFRAN) injection 4 mg (not  administered)     Initial Impression / Assessment and Plan / ED Course  I have reviewed the triage vital signs and the nursing notes.  Pertinent labs & imaging results that were available during my care of the patient were reviewed by me and considered in my medical decision making (see chart for details).     Nausea and vomiting over the past 5 days. Patient states unable to keep anything down today. She appears well-hydrated however abdomen is soft without peritoneal signs.  Mild transaminitis noted. Likely due to alcohol abuse.  Lipase is normal. No leukocytosis. UA without ketones. Gallbladder ultrasound shows hepatic steatosis. Suspect patient's transaminitis is due to alcohol use.  Patient continues to have nausea despite IV fluids and attempt at exam the ED. She is also persistently tachycardic. We'll give additional IV fluids and antiemetics. Drug screen pending.  Dr. Hyacinth Meeker to assume care at  shift change. Anticipate discharge home once tolerating PO with PPI, antiemetic, and reduction in alcohol intake instructions.   Final Clinical Impressions(s) / ED Diagnoses   Final diagnoses:  Elevated LFTs  Nausea and vomiting, intractability of vomiting not specified, unspecified vomiting type  Alcohol abuse    New Prescriptions New Prescriptions   No medications on file     Glynn Octave, MD 05/04/17 [phone]

## 2018-05-04 ENCOUNTER — Encounter (HOSPITAL_COMMUNITY): Payer: Self-pay | Admitting: Emergency Medicine

## 2018-05-04 ENCOUNTER — Emergency Department (HOSPITAL_COMMUNITY)
Admission: EM | Admit: 2018-05-04 | Discharge: 2018-05-04 | Disposition: A | Discharge status: Home / Self Care | Payer: Self-pay | Attending: Emergency Medicine | Admitting: Emergency Medicine

## 2018-05-04 ENCOUNTER — Other Ambulatory Visit: Payer: Self-pay

## 2018-05-04 ENCOUNTER — Emergency Department (HOSPITAL_COMMUNITY): Payer: Self-pay

## 2018-05-04 DIAGNOSIS — Z5321 Procedure and treatment not carried out due to patient leaving prior to being seen by health care provider: Secondary | ICD-10-CM | POA: Insufficient documentation

## 2018-05-04 DIAGNOSIS — R079 Chest pain, unspecified: Secondary | ICD-10-CM | POA: Insufficient documentation

## 2018-05-04 LAB — I-STAT BETA HCG BLOOD, ED (MC, WL, AP ONLY)

## 2018-05-04 LAB — CBC
HEMATOCRIT: 41.4 % (ref 36.0–46.0)
Hemoglobin: 13.4 g/dL (ref 12.0–15.0)
MCH: 31.3 pg (ref 26.0–34.0)
MCHC: 32.4 g/dL (ref 30.0–36.0)
MCV: 96.7 fL (ref 78.0–100.0)
PLATELETS: 248 10*3/uL (ref 150–400)
RBC: 4.28 MIL/uL (ref 3.87–5.11)
RDW: 14.8 % (ref 11.5–15.5)
WBC: 7.6 10*3/uL (ref 4.0–10.5)

## 2018-05-04 LAB — BASIC METABOLIC PANEL
ANION GAP: 12 (ref 5–15)
BUN: 6 mg/dL (ref 6–20)
CHLORIDE: 100 mmol/L (ref 98–111)
CO2: 24 mmol/L (ref 22–32)
CREATININE: 0.7 mg/dL (ref 0.44–1.00)
Calcium: 9.5 mg/dL (ref 8.9–10.3)
GFR calc non Af Amer: >60 mL/min (ref >60)
Glucose, Bld: 121 mg/dL — ABNORMAL HIGH (ref 70–99)
POTASSIUM: 4.3 mmol/L (ref 3.5–5.1)
SODIUM: 136 mmol/L (ref 135–145)

## 2018-05-04 LAB — I-STAT TROPONIN, ED: Troponin i, poc: 0 ng/mL (ref 0.00–0.08)

## 2018-05-04 NOTE — ED Notes (Signed)
Unable to locate pt in waiting area 

## 2018-05-04 NOTE — ED Provider Notes (Signed)
Patient eloped from the emergency department before provider evaluation. She was never placed in an exam room. I did not see or evaluate this patient. Triage note states pt with central CP onset this morning.     Cassy Sprowl, Boyd KerbsHannah, PA-C 05/04/18 2353    Tilden Fossaees, Elizabeth, MD 05/05/18 [phone]

## 2018-05-04 NOTE — ED Triage Notes (Signed)
Pt reports central CP that started this AM. Endorses SOB, denies any N/V or dizziness.

## 2018-11-23 ENCOUNTER — Other Ambulatory Visit: Payer: Self-pay

## 2018-11-23 ENCOUNTER — Ambulatory Visit (HOSPITAL_COMMUNITY)
Admission: EM | Admit: 2018-11-23 | Discharge: 2018-11-23 | Disposition: A | Discharge status: Home / Self Care | Payer: Self-pay | Attending: Internal Medicine | Admitting: Internal Medicine

## 2018-11-23 ENCOUNTER — Encounter (HOSPITAL_COMMUNITY): Payer: Self-pay | Admitting: Emergency Medicine

## 2018-11-23 DIAGNOSIS — Z7689 Persons encountering health services in other specified circumstances: Secondary | ICD-10-CM

## 2018-11-23 DIAGNOSIS — Z0289 Encounter for other administrative examinations: Secondary | ICD-10-CM

## 2018-11-23 NOTE — ED Provider Notes (Signed)
[facility]    CSN: [id number] Arrival date & time: 11/23/18  1728     History   Chief Complaint Chief Complaint  Patient presents with  . Letter for School/Work    HPI Michele Rich is a 33 y.o. female with no past medical history comes to urgent care for return to work evaluation.  Patient decided to follow Lajoyce LauberGovernor Roy Cooper's order to stay at home so she did not go to work.  She wants to go to work at this time and was instructed to come for return to work evaluation.  She denies any fever, chills, cough, sputum production, shortness of breath.  No exposure to any sick person.   Past Medical History:  Diagnosis Date  . HPV in female     There are no active problems to display for this patient.   Past Surgical History:  Procedure Laterality Date  . NO PAST SURGERIES      OB History    Gravida  3   Para  2   Term  2   Preterm      AB  1   Living  2     SAB      TAB      Ectopic      Multiple      Live Births               Home Medications    Prior to Admission medications   Medication Sig Start Date End Date Taking? Authorizing Provider  norgestimate-ethinyl estradiol (ORTHO-CYCLEN,SPRINTEC,PREVIFEM) 0.25-35 MG-MCG tablet Take 1 tablet by mouth daily. Patient not taking: Reported on 05/04/2017 06/10/15   Katrinka BlazingSmith, [place], CNM  omeprazole (PRILOSEC) 20 MG capsule Take 1 capsule (20 mg total) by mouth daily. 05/04/17   Rancour, Jeannett SeniorStephen, MD  ondansetron (ZOFRAN ODT) 4 MG disintegrating tablet Take 1 tablet (4 mg total) by mouth every 8 (eight) hours as needed for nausea or vomiting. 05/04/17   Glynn Octaveancour, Stephen, MD    Family History No family history on file.  Social History Social History   Tobacco Use  . Smoking status: Current Every Day Smoker    Packs/day: 0.25  . Smokeless tobacco: Never Used  Substance Use Topics  . Alcohol use: Yes    Comment: socially  . Drug use: No     Allergies   Apple fruit extract    Review of Systems Review of Systems  Constitutional: Negative for activity change and appetite change.  HENT: Negative for congestion and facial swelling.   Respiratory: Negative.  Negative for cough, chest tightness and shortness of breath.   Cardiovascular: Negative.  Negative for chest pain and palpitations.  Gastrointestinal: Negative.  Negative for abdominal distention and abdominal pain.  Genitourinary: Negative.   Musculoskeletal: Negative for arthralgias, back pain and gait problem.     Physical Exam Triage Vital Signs ED Triage Vitals [11/23/18 1752]  Enc Vitals Group     BP (!) 152/93     Pulse Rate 93     Resp 16     Temp 98.4 F (36.9 C)     Temp Source Oral     SpO2 99 %     Weight      Height      Head Circumference      Peak Flow      Pain Score 0     Pain Loc      Pain Edu?      Excl. in GC?  No data found.  Updated Vital Signs BP (!) 152/93   Pulse 93   Temp 98.4 F (36.9 C) (Oral)   Resp 16   LMP 11/14/2018   SpO2 99%   Visual Acuity Right Eye Distance:   Left Eye Distance:   Bilateral Distance:    Right Eye Near:   Left Eye Near:    Bilateral Near:     Physical Exam Vitals signs and nursing note reviewed.  Constitutional:      General: She is not in acute distress.    Appearance: Normal appearance. She is not ill-appearing, toxic-appearing or diaphoretic.  Neck:     Musculoskeletal: Normal range of motion. No neck rigidity.  Cardiovascular:     Rate and Rhythm: Normal rate and regular rhythm.     Pulses: Normal pulses.     Heart sounds: Normal heart sounds.  Pulmonary:     Effort: Pulmonary effort is normal.     Breath sounds: Normal breath sounds.  Abdominal:     General: Bowel sounds are normal. There is no distension.     Palpations: Abdomen is soft.     Tenderness: There is no abdominal tenderness. There is no guarding.  Musculoskeletal: Normal range of motion.        General: No swelling or deformity.  Lymphadenopathy:      Cervical: No cervical adenopathy.  Skin:    General: Skin is warm.  Neurological:     Mental Status: She is alert.      UC Treatments / Results  Labs (all labs ordered are listed, but only abnormal results are displayed) Labs Reviewed - No data to display  EKG None  Radiology No results found.  Procedures Procedures (including critical care time)  Medications Ordered in UC Medications - No data to display  Initial Impression / Assessment and Plan / UC Course  I have reviewed the triage vital signs and the nursing notes.  Pertinent labs & imaging results that were available during my care of the patient were reviewed by me and considered in my medical decision making (see chart for details).     1.  Return to work evaluation: Work note has been given Patient has no signs or symptoms of contagious disease She can return to work tomorrow  Final Clinical Impressions(s) / UC Diagnoses   Final diagnoses:  None   Discharge Instructions   None    ED Prescriptions    None     Controlled Substance Prescriptions [place] Controlled Substance Registry consulted? No   Merrilee Jansky, MD 11/23/18 1816

## 2018-11-23 NOTE — ED Triage Notes (Signed)
PT reports she kept herself out of work to social distance. Never was sick, now needs a note to return to work.

## 2019-01-02 ENCOUNTER — Encounter (HOSPITAL_COMMUNITY): Payer: Self-pay | Admitting: Family Medicine

## 2019-01-02 ENCOUNTER — Emergency Department (HOSPITAL_COMMUNITY)
Admission: EM | Admit: 2019-01-02 | Discharge: 2019-01-03 | Disposition: A | Discharge status: Home / Self Care | Payer: Self-pay | Attending: Emergency Medicine | Admitting: Emergency Medicine

## 2019-01-02 DIAGNOSIS — F10232 Alcohol dependence with withdrawal with perceptual disturbance: Secondary | ICD-10-CM | POA: Insufficient documentation

## 2019-01-02 DIAGNOSIS — F17210 Nicotine dependence, cigarettes, uncomplicated: Secondary | ICD-10-CM | POA: Insufficient documentation

## 2019-01-02 LAB — COMPREHENSIVE METABOLIC PANEL
ALT: 59 U/L — ABNORMAL HIGH (ref 0–44)
AST: 81 U/L — ABNORMAL HIGH (ref 15–41)
Albumin: 4.7 g/dL (ref 3.5–5.0)
Alkaline Phosphatase: 101 U/L (ref 38–126)
Anion gap: 12 (ref 5–15)
BUN: 9 mg/dL (ref 6–20)
CO2: 25 mmol/L (ref 22–32)
Calcium: 9.3 mg/dL (ref 8.9–10.3)
Chloride: 100 mmol/L (ref 98–111)
Creatinine, Ser: 0.73 mg/dL (ref 0.44–1.00)
GFR calc Af Amer: >60 mL/min (ref >60)
GFR calc non Af Amer: >60 mL/min (ref >60)
Glucose, Bld: 101 mg/dL — ABNORMAL HIGH (ref 70–99)
Potassium: 3 mmol/L — ABNORMAL LOW (ref 3.5–5.1)
Sodium: 137 mmol/L (ref 135–145)
Total Bilirubin: 1.8 mg/dL — ABNORMAL HIGH (ref 0.3–1.2)
Total Protein: 8.2 g/dL — ABNORMAL HIGH (ref 6.5–8.1)

## 2019-01-02 LAB — I-STAT BETA HCG BLOOD, ED (MC, WL, AP ONLY): I-stat hCG, quantitative: <5 m[IU]/mL (ref <5)

## 2019-01-02 MED ORDER — LORAZEPAM 1 MG PO TABS: 0.0000 mg | ORAL_TABLET | Freq: Four times a day (QID) | ORAL | Status: DC

## 2019-01-02 MED ORDER — THIAMINE HCL 100 MG/ML IJ SOLN: 100.0000 mg | Freq: Every day | INTRAMUSCULAR | Status: DC

## 2019-01-02 MED ORDER — SODIUM CHLORIDE 0.9 % IV BOLUS
2000.0000 mL | Freq: Once | INTRAVENOUS | Status: AC
  Administered 2019-01-02: 2000 mL via INTRAVENOUS

## 2019-01-02 MED ORDER — VITAMIN B-1 100 MG PO TABS: 100.0000 mg | ORAL_TABLET | Freq: Every day | ORAL | Status: DC

## 2019-01-02 MED ORDER — LORAZEPAM 2 MG/ML IJ SOLN
0.0000 mg | Freq: Four times a day (QID) | INTRAMUSCULAR | Status: DC
  Administered 2019-01-02: 2 mg via INTRAVENOUS
  Filled 2019-01-02: qty 1

## 2019-01-02 MED ORDER — LORAZEPAM 1 MG PO TABS: 0.0000 mg | ORAL_TABLET | Freq: Two times a day (BID) | ORAL | Status: DC

## 2019-01-02 MED ORDER — LORAZEPAM 2 MG/ML IJ SOLN: 0.0000 mg | Freq: Two times a day (BID) | INTRAMUSCULAR | Status: DC

## 2019-01-02 MED ORDER — LORAZEPAM 2 MG/ML IJ SOLN
1.0000 mg | Freq: Once | INTRAMUSCULAR | Status: AC
  Administered 2019-01-02: 1 mg via INTRAVENOUS
  Filled 2019-01-02: qty 1

## 2019-01-02 NOTE — ED Notes (Signed)
Date and time results received: 01/02/19 23:40   Test: Potassium 2.4 and Calcium 5.9 Critical Value:  Name of Provider Notified: Dr. Salena Saner Tegeler   Orders Received? Or Actions Taken?: Will continue to monitor and await for new orders.

## 2019-01-02 NOTE — ED Triage Notes (Signed)
Patient is from home and transported via [facility]. Patient has been sober for 4 days. She has not eat or slept for 4-5 days and experiencing visual hallucinations of bugs crawling on her.

## 2019-01-02 NOTE — ED Provider Notes (Signed)
[facility]-EMERGENCY DEPT Provider Note   CSN: [id number] Arrival date & time: 01/02/19  2203    History   Chief Complaint Chief Complaint  Patient presents with  . Detox    HPI Michele Rich is a 33 y.o. female.     Patient to ED with symptoms of visual and tactile hallucinations. She reports being a daily, large quantity alcohol drinker and that she is trying to quit. Last drink was 4-5 days ago. No seizures, confusion or agitation. She had nausea and vomiting for 1-2 days, but these symptoms have resolved. This morning she states she started seeing bugs flying around her and this progressed through the day to where she feels bugs crawling on her. She denies any history of hallucinations in the past. No SI/HI. She reports she has quit in the past without difficulty other than nausea and vomiting.  The history is provided by the patient. No language interpreter was used.    Past Medical History:  Diagnosis Date  . HPV in female     There are no active problems to display for this patient.   Past Surgical History:  Procedure Laterality Date  . NO PAST SURGERIES       OB History    Gravida  3   Para  2   Term  2   Preterm      AB  1   Living  2     SAB      TAB      Ectopic      Multiple      Live Births               Home Medications    Prior to Admission medications   Medication Sig Start Date End Date Taking? Authorizing Provider  norgestimate-ethinyl estradiol (ORTHO-CYCLEN,SPRINTEC,PREVIFEM) 0.25-35 MG-MCG tablet Take 1 tablet by mouth daily. Patient not taking: Reported on 05/04/2017 06/10/15   Katrinka Blazing, [place], CNM  omeprazole (PRILOSEC) 20 MG capsule Take 1 capsule (20 mg total) by mouth daily. 05/04/17   Rancour, Jeannett Senior, MD  ondansetron (ZOFRAN ODT) 4 MG disintegrating tablet Take 1 tablet (4 mg total) by mouth every 8 (eight) hours as needed for nausea or vomiting. 05/04/17   Glynn Octave, MD    Family  History History reviewed. No pertinent family history.  Social History Social History   Tobacco Use  . Smoking status: Current Every Day Smoker    Packs/day: 0.25  . Smokeless tobacco: Never Used  Substance Use Topics  . Alcohol use: Yes    Comment: Last drink: 4 days ago   . Drug use: No     Allergies   Apple fruit extract   Review of Systems Review of Systems  Constitutional: Negative for chills and fever.  HENT: Negative.   Respiratory: Negative.   Cardiovascular: Negative.   Gastrointestinal: Negative.   Musculoskeletal: Negative.   Skin: Negative.   Neurological: Negative.  Negative for tremors and headaches.  Psychiatric/Behavioral: Positive for hallucinations. Negative for agitation, confusion and suicidal ideas.     Physical Exam Updated Vital Signs BP (!) 150/110 (BP Location: Left Arm)   Pulse (!) 116   Temp 98.8 F (37.1 C) (Oral)   Resp 16   SpO2 96%   Physical Exam Constitutional:      Appearance: Normal appearance. She is well-developed. She is not ill-appearing.  HENT:     Head: Normocephalic.  Neck:     Musculoskeletal: Normal range of motion and  neck supple.  Cardiovascular:     Rate and Rhythm: Regular rhythm. Tachycardia present.     Heart sounds: No murmur.  Pulmonary:     Effort: Pulmonary effort is normal.     Breath sounds: Normal breath sounds. No wheezing, rhonchi or rales.  Abdominal:     General: Bowel sounds are normal.     Palpations: Abdomen is soft.     Tenderness: There is no abdominal tenderness. There is no guarding or rebound.  Musculoskeletal: Normal range of motion.  Skin:    General: Skin is warm and dry.     Findings: No rash.  Neurological:     General: No focal deficit present.     Mental Status: She is alert and oriented to person, place, and time.     Sensory: No sensory deficit.     Coordination: Coordination normal.  Psychiatric:     Comments: Actively hallucinating, frequent jerking movements and  swatting at bugs she feels is on her skin.       ED Treatments / Results  Labs (all labs ordered are listed, but only abnormal results are displayed) Labs Reviewed - No data to display  EKG None  Radiology No results found.  Procedures Procedures (including critical care time)  Medications Ordered in ED Medications - No data to display   Initial Impression / Assessment and Plan / ED Course  I have reviewed the triage vital signs and the nursing notes.  Pertinent labs & imaging results that were available during my care of the patient were reviewed by me and considered in my medical decision making (see chart for details).        Patient to ED after alcohol cessation x 4-5 days with active visual and tactile hallucinations that started today. She had nausea and vomiting over the previous couple of days but none today. History of daily heavy alcohol use.  She is tachycardic, hypertensive and actively hallucinating. IVF's ordered, Ativan, basic labs to assess for any metabolic abnormality. CIWA protocol started.   Recheck - she is no longer hallucinating. She is walking around the room, steady gait. She is alert and oriented, appropriately responsive.   Tachycardia and hypertension have resolved. She is felt appropriate for discharge home. Will Rx Librium and provide resources for treatment programs.   Final Clinical Impressions(s) / ED Diagnoses   Final diagnoses:  None   1. Alcohol dependence  ED Discharge Orders    None       Elpidio AnisUpstill, Alicha Raspberry, PA-C 01/03/19 0127    Tegeler, Canary Brimhristopher J, MD 01/03/19 1316

## 2019-01-03 ENCOUNTER — Emergency Department (HOSPITAL_COMMUNITY)
Admission: EM | Admit: 2019-01-03 | Discharge: 2019-01-04 | Disposition: A | Discharge status: Other Acute Inpatient Hospital | Payer: Self-pay | Attending: Emergency Medicine | Admitting: Emergency Medicine

## 2019-01-03 DIAGNOSIS — Z046 Encounter for general psychiatric examination, requested by authority: Secondary | ICD-10-CM | POA: Insufficient documentation

## 2019-01-03 DIAGNOSIS — R410 Disorientation, unspecified: Secondary | ICD-10-CM | POA: Insufficient documentation

## 2019-01-03 DIAGNOSIS — Z1159 Encounter for screening for other viral diseases: Secondary | ICD-10-CM | POA: Insufficient documentation

## 2019-01-03 DIAGNOSIS — F17200 Nicotine dependence, unspecified, uncomplicated: Secondary | ICD-10-CM | POA: Insufficient documentation

## 2019-01-03 DIAGNOSIS — I959 Hypotension, unspecified: Secondary | ICD-10-CM | POA: Insufficient documentation

## 2019-01-03 DIAGNOSIS — R61 Generalized hyperhidrosis: Secondary | ICD-10-CM | POA: Insufficient documentation

## 2019-01-03 DIAGNOSIS — F419 Anxiety disorder, unspecified: Secondary | ICD-10-CM | POA: Insufficient documentation

## 2019-01-03 DIAGNOSIS — R443 Hallucinations, unspecified: Secondary | ICD-10-CM | POA: Insufficient documentation

## 2019-01-03 DIAGNOSIS — F22 Delusional disorders: Secondary | ICD-10-CM | POA: Insufficient documentation

## 2019-01-03 DIAGNOSIS — R000 Tachycardia, unspecified: Secondary | ICD-10-CM | POA: Insufficient documentation

## 2019-01-03 LAB — CBC WITH DIFFERENTIAL/PLATELET
Abs Immature Granulocytes: 0.02 10*3/uL (ref 0.00–0.07)
Abs Immature Granulocytes: 0.04 10*3/uL (ref 0.00–0.07)
Basophils Absolute: 0 10*3/uL (ref 0.0–0.1)
Basophils Absolute: 0 10*3/uL (ref 0.0–0.1)
Basophils Relative: 0 %
Basophils Relative: 0 %
Eosinophils Absolute: 0.1 10*3/uL (ref 0.0–0.5)
Eosinophils Absolute: 0.1 10*3/uL (ref 0.0–0.5)
Eosinophils Relative: 1 %
Eosinophils Relative: 1 %
HCT: 36.7 % (ref 36.0–46.0)
HCT: 40.3 % (ref 36.0–46.0)
Hemoglobin: 12.6 g/dL (ref 12.0–15.0)
Hemoglobin: 13.9 g/dL (ref 12.0–15.0)
Immature Granulocytes: 0 %
Immature Granulocytes: 1 %
Lymphocytes Relative: 16 %
Lymphocytes Relative: 18 %
Lymphs Abs: 1.3 10*3/uL (ref 0.7–4.0)
Lymphs Abs: 1.4 10*3/uL (ref 0.7–4.0)
MCH: 34.5 pg — ABNORMAL HIGH (ref 26.0–34.0)
MCH: 35.1 pg — ABNORMAL HIGH (ref 26.0–34.0)
MCHC: 34.3 g/dL (ref 30.0–36.0)
MCHC: 34.5 g/dL (ref 30.0–36.0)
MCV: 100.5 fL — ABNORMAL HIGH (ref 80.0–100.0)
MCV: 101.8 fL — ABNORMAL HIGH (ref 80.0–100.0)
Monocytes Absolute: 0.4 10*3/uL (ref 0.1–1.0)
Monocytes Absolute: 0.4 10*3/uL (ref 0.1–1.0)
Monocytes Relative: 5 %
Monocytes Relative: 5 %
Neutro Abs: 5.8 10*3/uL (ref 1.7–7.7)
Neutro Abs: 6.3 10*3/uL (ref 1.7–7.7)
Neutrophils Relative %: 76 %
Neutrophils Relative %: 77 %
Platelets: 114 10*3/uL — ABNORMAL LOW (ref 150–400)
Platelets: DECREASED 10*3/uL (ref 150–400)
RBC: 3.65 MIL/uL — ABNORMAL LOW (ref 3.87–5.11)
RBC: 3.96 MIL/uL (ref 3.87–5.11)
RDW: 13.3 % (ref 11.5–15.5)
RDW: 13.4 % (ref 11.5–15.5)
WBC: 7.6 10*3/uL (ref 4.0–10.5)
WBC: 8 10*3/uL (ref 4.0–10.5)
nRBC: 0 % (ref 0.0–0.2)
nRBC: 0 % (ref 0.0–0.2)

## 2019-01-03 LAB — I-STAT BETA HCG BLOOD, ED (MC, WL, AP ONLY): I-stat hCG, quantitative: <5 m[IU]/mL (ref <5)

## 2019-01-03 LAB — COMPREHENSIVE METABOLIC PANEL
ALT: 55 U/L — ABNORMAL HIGH (ref 0–44)
AST: 99 U/L — ABNORMAL HIGH (ref 15–41)
Albumin: 4 g/dL (ref 3.5–5.0)
Alkaline Phosphatase: 88 U/L (ref 38–126)
Anion gap: 12 (ref 5–15)
BUN: 8 mg/dL (ref 6–20)
CO2: 24 mmol/L (ref 22–32)
Calcium: 9.3 mg/dL (ref 8.9–10.3)
Chloride: 102 mmol/L (ref 98–111)
Creatinine, Ser: 0.7 mg/dL (ref 0.44–1.00)
GFR calc Af Amer: >60 mL/min (ref >60)
GFR calc non Af Amer: >60 mL/min (ref >60)
Glucose, Bld: 118 mg/dL — ABNORMAL HIGH (ref 70–99)
Potassium: 2.8 mmol/L — ABNORMAL LOW (ref 3.5–5.1)
Sodium: 138 mmol/L (ref 135–145)
Total Bilirubin: 1.3 mg/dL — ABNORMAL HIGH (ref 0.3–1.2)
Total Protein: 7.5 g/dL (ref 6.5–8.1)

## 2019-01-03 LAB — RAPID URINE DRUG SCREEN, HOSP PERFORMED
Amphetamines: NOT DETECTED
Barbiturates: NOT DETECTED
Benzodiazepines: POSITIVE — AB
Cocaine: NOT DETECTED
Opiates: NOT DETECTED
Tetrahydrocannabinol: NOT DETECTED

## 2019-01-03 LAB — ETHANOL: Alcohol, Ethyl (B): <10 mg/dL (ref <10)

## 2019-01-03 LAB — ACETAMINOPHEN LEVEL: Acetaminophen (Tylenol), Serum: <10 ug/mL — ABNORMAL LOW (ref 10–30)

## 2019-01-03 LAB — SALICYLATE LEVEL: Salicylate Lvl: 7.8 mg/dL (ref 2.8–30.0)

## 2019-01-03 LAB — PREGNANCY, URINE: Preg Test, Ur: NEGATIVE

## 2019-01-03 MED ORDER — LORAZEPAM 1 MG PO TABS: 0.0000 mg | ORAL_TABLET | Freq: Two times a day (BID) | ORAL | Status: DC

## 2019-01-03 MED ORDER — SODIUM CHLORIDE 0.9 % IV BOLUS
1000.0000 mL | Freq: Once | INTRAVENOUS | Status: AC
  Administered 2019-01-04: 1000 mL via INTRAVENOUS

## 2019-01-03 MED ORDER — LORAZEPAM 1 MG PO TABS: 0.0000 mg | ORAL_TABLET | Freq: Four times a day (QID) | ORAL | Status: DC

## 2019-01-03 MED ORDER — VITAMIN B-1 100 MG PO TABS
100.0000 mg | ORAL_TABLET | Freq: Every day | ORAL | Status: DC
  Administered 2019-01-04: 100 mg via ORAL
  Filled 2019-01-03: qty 1

## 2019-01-03 MED ORDER — LORAZEPAM 1 MG PO TABS
1.0000 mg | ORAL_TABLET | Freq: Once | ORAL | Status: AC
  Administered 2019-01-03: 1 mg via ORAL
  Filled 2019-01-03: qty 1

## 2019-01-03 MED ORDER — CHLORDIAZEPOXIDE HCL 25 MG PO CAPS: ORAL_CAPSULE | ORAL | 0 refills | Status: DC

## 2019-01-03 MED ORDER — LORAZEPAM 2 MG/ML IJ SOLN: 0.0000 mg | Freq: Two times a day (BID) | INTRAMUSCULAR | Status: DC

## 2019-01-03 MED ORDER — POTASSIUM CHLORIDE CRYS ER 20 MEQ PO TBCR
40.0000 meq | EXTENDED_RELEASE_TABLET | Freq: Once | ORAL | Status: AC
  Administered 2019-01-03: 01:00:00 40 meq via ORAL
  Filled 2019-01-03: qty 2

## 2019-01-03 MED ORDER — LORAZEPAM 2 MG/ML IJ SOLN: 0.0000 mg | Freq: Four times a day (QID) | INTRAMUSCULAR | Status: DC

## 2019-01-03 MED ORDER — POTASSIUM CHLORIDE CRYS ER 20 MEQ PO TBCR
40.0000 meq | EXTENDED_RELEASE_TABLET | Freq: Once | ORAL | Status: AC
  Administered 2019-01-04: 40 meq via ORAL
  Filled 2019-01-03: qty 2

## 2019-01-03 MED ORDER — THIAMINE HCL 100 MG/ML IJ SOLN: 100.0000 mg | Freq: Every day | INTRAMUSCULAR | Status: DC

## 2019-01-03 NOTE — ED Provider Notes (Signed)
[facility] EMERGENCY DEPARTMENT Provider Note   CSN: [id number] Arrival date & time: 01/03/19  2206    History   Chief Complaint Chief Complaint  Patient presents with  . Medical Clearance    HPI Michele Rich is a 33 y.o. female.     Patient presents to the emergency department with a chief complaint of hallucinations.  She states that "they implanted something in my brain and it is not working."  She is not able to significantly contribute to her history, level 5 caveat applies secondary to psychiatric condition.  The history is provided by the patient. No language interpreter was used.    Past Medical History:  Diagnosis Date  . HPV in female     There are no active problems to display for this patient.   Past Surgical History:  Procedure Laterality Date  . NO PAST SURGERIES       OB History    Gravida  3   Para  2   Term  2   Preterm      AB  1   Living  2     SAB      TAB      Ectopic      Multiple      Live Births               Home Medications    Prior to Admission medications   Medication Sig Start Date End Date Taking? Authorizing Provider  chlordiazePOXIDE (LIBRIUM) 25 MG capsule 50mg  PO TID x 1D, then 25-50mg  PO BID X 1D, then 25-50mg  PO QD X 1D 01/03/19   Upstill, Shari, PA-C  diphenhydrAMINE (BENADRYL) 25 mg capsule Take 25 mg by mouth every 6 (six) hours as needed for itching.    [provider]  Ibuprofen-diphenhydrAMINE HCl (ADVIL PM) 200-25 MG CAPS Take 1 capsule by mouth at bedtime as needed (sleep).    [provider]    Family History No family history on file.  Social History Social History   Tobacco Use  . Smoking status: Current Every Day Smoker    Packs/day: 0.25  . Smokeless tobacco: Never Used  Substance Use Topics  . Alcohol use: Yes    Comment: Last drink: 4 days ago   . Drug use: No     Allergies   Apple fruit extract   Review of Systems Review of  Systems  Unable to perform ROS: Psychiatric disorder     Physical Exam Updated Vital Signs There were no vitals taken for this visit.  Physical Exam Vitals signs and nursing note reviewed.  Constitutional:      General: She is not in acute distress.    Appearance: She is well-developed.  HENT:     Head: Normocephalic and atraumatic.  Eyes:     Conjunctiva/sclera: Conjunctivae normal.  Neck:     Musculoskeletal: Neck supple.  Cardiovascular:     Rate and Rhythm: Regular rhythm. Tachycardia present.     Heart sounds: No murmur.  Pulmonary:     Effort: Pulmonary effort is normal. No respiratory distress.     Breath sounds: Normal breath sounds.  Abdominal:     Palpations: Abdomen is soft.     Tenderness: There is no abdominal tenderness.  Musculoskeletal:        General: No deformity.  Skin:    General: Skin is warm and dry.  Neurological:     Mental Status: She is alert.  Comments: Moves all extremities  Psychiatric:     Comments: Anxious, fidgety      ED Treatments / Results  Labs (all labs ordered are listed, but only abnormal results are displayed) Labs Reviewed  RAPID URINE DRUG SCREEN, HOSP PERFORMED  CBC WITH DIFFERENTIAL/PLATELET  COMPREHENSIVE METABOLIC PANEL  PREGNANCY, URINE  SALICYLATE LEVEL  ACETAMINOPHEN LEVEL  ETHANOL  I-STAT BETA HCG BLOOD, ED ([facility], [facility], [facility] ONLY)    EKG None  Radiology No results found.  Procedures Procedures (including critical care time)  Medications Ordered in ED Medications  LORazepam (ATIVAN) tablet 1 mg (has no administration in time range)  LORazepam (ATIVAN) injection 0-4 mg (has no administration in time range)    Or  LORazepam (ATIVAN) tablet 0-4 mg (has no administration in time range)  LORazepam (ATIVAN) injection 0-4 mg (has no administration in time range)    Or  LORazepam (ATIVAN) tablet 0-4 mg (has no administration in time range)  thiamine (VITAMIN B-1) tablet 100 mg (has no administration in  time range)    Or  thiamine (B-1) injection 100 mg (has no administration in time range)     Initial Impression / Assessment and Plan / ED Course  I have reviewed the triage vital signs and the nursing notes.  Pertinent labs & imaging results that were available during my care of the patient were reviewed by me and considered in my medical decision making (see chart for details).        Patient with significant history of alcohol abuse, presents to the emergency department tachycardic, hypotensive, diaphoretic, and hallucinating.  Will start CIWA protocol.  Will check for metabolic abnormality.  This is the second time this week that the patient has been sent in for the same.  She may require an admission for acute alcohol withdrawal depending on how she responds to Ativan and fluids.  Vitals rapidly normalized after calming down in the ED.  Doubt acute withdrawal.  Medically clear for TTS evaluation.  Final Clinical Impressions(s) / ED Diagnoses   Final diagnoses:  Hallucinations    ED Discharge Orders    None       Roxy HorsemanBrowning, Zach Tietje, PA-C 01/04/19 0456    Dione BoozeGlick, David, MD 01/04/19 2258

## 2019-01-03 NOTE — ED Triage Notes (Signed)
Pt to ED via [facility] with c/o hallucinations.  Pt was at [facility] yesterday for same and discharged with medications.  EMS st's family wants pt to stay longer today

## 2019-01-03 NOTE — Discharge Instructions (Signed)
Take Librium as prescribed while stopping alcohol use.   Return to the emergency department with any new or concerning symptoms.

## 2019-01-03 NOTE — ED Notes (Signed)
Gave pt purple scrubs to change into 

## 2019-01-04 ENCOUNTER — Inpatient Hospital Stay (HOSPITAL_COMMUNITY)
Admission: AD | Admit: 2019-01-04 | Discharge: 2019-01-05 | DRG: 897 | Disposition: A | Discharge status: Home / Self Care | Payer: No Typology Code available for payment source | Source: Intra-hospital | Attending: Psychiatry | Admitting: Psychiatry

## 2019-01-04 ENCOUNTER — Encounter (HOSPITAL_COMMUNITY): Payer: Self-pay

## 2019-01-04 ENCOUNTER — Other Ambulatory Visit: Payer: Self-pay

## 2019-01-04 ENCOUNTER — Other Ambulatory Visit: Payer: Self-pay | Admitting: Behavioral Health

## 2019-01-04 DIAGNOSIS — F17210 Nicotine dependence, cigarettes, uncomplicated: Secondary | ICD-10-CM | POA: Diagnosis present

## 2019-01-04 DIAGNOSIS — F1024 Alcohol dependence with alcohol-induced mood disorder: Secondary | ICD-10-CM | POA: Diagnosis present

## 2019-01-04 DIAGNOSIS — R442 Other hallucinations: Secondary | ICD-10-CM | POA: Diagnosis present

## 2019-01-04 DIAGNOSIS — Z1159 Encounter for screening for other viral diseases: Secondary | ICD-10-CM | POA: Diagnosis not present

## 2019-01-04 DIAGNOSIS — F10239 Alcohol dependence with withdrawal, unspecified: Secondary | ICD-10-CM | POA: Diagnosis present

## 2019-01-04 DIAGNOSIS — F329 Major depressive disorder, single episode, unspecified: Secondary | ICD-10-CM | POA: Insufficient documentation

## 2019-01-04 DIAGNOSIS — G4700 Insomnia, unspecified: Secondary | ICD-10-CM | POA: Diagnosis present

## 2019-01-04 LAB — SARS CORONAVIRUS 2 BY RT PCR (HOSPITAL ORDER, PERFORMED IN ~~LOC~~ HOSPITAL LAB): SARS Coronavirus 2: NEGATIVE

## 2019-01-04 MED ORDER — ALUM & MAG HYDROXIDE-SIMETH 200-200-20 MG/5ML PO SUSP: 30.0000 mL | ORAL | Status: DC | PRN

## 2019-01-04 MED ORDER — RISPERIDONE 1 MG PO TABS: 1.0000 mg | ORAL_TABLET | Freq: Three times a day (TID) | ORAL | Status: DC | PRN

## 2019-01-04 MED ORDER — HYDROXYZINE HCL 25 MG PO TABS
25.0000 mg | ORAL_TABLET | Freq: Four times a day (QID) | ORAL | Status: DC | PRN
  Administered 2019-01-04: 25 mg via ORAL
  Filled 2019-01-04: qty 10
  Filled 2019-01-04: qty 1

## 2019-01-04 MED ORDER — VITAMIN B-1 100 MG PO TABS
100.0000 mg | ORAL_TABLET | Freq: Every day | ORAL | Status: DC
  Administered 2019-01-04 – 2019-01-05 (×2): 100 mg via ORAL
  Filled 2019-01-04 (×3): qty 1

## 2019-01-04 MED ORDER — LORAZEPAM 1 MG PO TABS: 1.0000 mg | ORAL_TABLET | Freq: Once | ORAL | Status: DC

## 2019-01-04 MED ORDER — POTASSIUM CHLORIDE CRYS ER 20 MEQ PO TBCR
20.0000 meq | EXTENDED_RELEASE_TABLET | Freq: Once | ORAL | Status: AC
  Administered 2019-01-04: 18:00:00 20 meq via ORAL
  Filled 2019-01-04: qty 1

## 2019-01-04 MED ORDER — ACETAMINOPHEN 325 MG PO TABS: 650.0000 mg | ORAL_TABLET | Freq: Four times a day (QID) | ORAL | Status: DC | PRN

## 2019-01-04 MED ORDER — FOLIC ACID 1 MG PO TABS
1.0000 mg | ORAL_TABLET | Freq: Every day | ORAL | Status: DC
  Administered 2019-01-04 – 2019-01-05 (×2): 1 mg via ORAL
  Filled 2019-01-04 (×3): qty 1

## 2019-01-04 MED ORDER — LORAZEPAM 1 MG PO TABS: 1.0000 mg | ORAL_TABLET | Freq: Four times a day (QID) | ORAL | Status: DC | PRN

## 2019-01-04 MED ORDER — TRAZODONE HCL 50 MG PO TABS
50.0000 mg | ORAL_TABLET | Freq: Every evening | ORAL | Status: DC | PRN
  Administered 2019-01-04: 50 mg via ORAL
  Filled 2019-01-04: qty 1
  Filled 2019-01-04: qty 7

## 2019-01-04 MED ORDER — NICOTINE POLACRILEX 2 MG MT GUM: 2.0000 mg | CHEWING_GUM | OROMUCOSAL | Status: DC | PRN

## 2019-01-04 NOTE — [facility] Suicide Risk Assessment (Signed)
[facility]   Nursing information obtained from:    Demographic factors:    Current Mental Status:    Loss Factors:    Historical Factors:    Risk Reduction Factors:     Total Time spent with patient: 30 minutes Principal Problem: Alcohol dependence with alcohol-induced mood disorder (HCC) Diagnosis:  Principal Problem:   Alcohol dependence with alcohol-induced mood disorder (HCC)  Subjective Data: Patient is seen and examined.  Patient is a [age] year old female with a past psychiatric history significant for alcohol dependence who [facility] on [facility]/04/2019 with worsening alcohol withdrawal symptoms.  The patient had presented originally on 01/02/2019 to the emergency department and received an outpatient Librium taper.  Unfortunately the patient began to develop worsening alcohol withdrawal symptoms.  She began to see bugs, hear voices and feel agitated.  She had had periods of time in the past where she had abruptly stopped her alcohol intake, and had never had symptoms similar to this.  This became quite frightening to her, and so she returned to the emergency department.  She admitted to insomnia, agitation, hallucinations as per above, hopelessness and irritability.  She denied any suicidal or homicidal ideation.  Secondary to the worsening alcohol withdrawal symptoms and the potential for life-threatening withdrawal she was admitted to the hospital for evaluation and stabilization.  Continued Clinical Symptoms:    The "Alcohol Use Disorders Identification Test", Guidelines for Use in Primary Care, Second Edition.  World Science writerHealth Organization [facility](WHO). Score between 0-7:  no or low risk or alcohol related problems. Score between 8-15:  moderate risk of alcohol related problems. Score between 16-19:  high risk of alcohol related problems. Score 20 or above:  warrants further diagnostic evaluation for alcohol dependence  and treatment.   CLINICAL FACTORS:   Depression:   Anhedonia Comorbid alcohol abuse/dependence Hopelessness Impulsivity Insomnia Alcohol/Substance Abuse/Dependencies   Musculoskeletal: Strength & Muscle Tone: within normal limits Gait & Station: normal Patient leans: N/A  Psychiatric Specialty Exam: Physical Exam  Nursing note and vitals reviewed. Constitutional: She is oriented to person, place, and time. She appears well-developed and well-nourished.  HENT:  Head: Normocephalic and atraumatic.  Respiratory: Effort normal.  Neurological: She is alert and oriented to person, place, and time.    ROS  There were no vitals taken for this visit.There is no height or weight on file to calculate BMI.  General Appearance: Casual  Eye Contact:  Good  Speech:  Normal Rate  Volume:  Normal  Mood:  Anxious  Affect:  Congruent  Thought Process:  Coherent and Descriptions of Associations: Intact  Orientation:  Full (Time, Place, and Person)  Thought Content:  Hallucinations: Auditory Visual  Suicidal Thoughts:  No  Homicidal Thoughts:  No  Memory:  Immediate;   Fair Recent;   Fair Remote;   Fair  Judgement:  Intact  Insight:  Fair  Psychomotor Activity:  Normal  Concentration:  Concentration: Fair and Attention Span: Fair  Recall:  [employer]Fair  Fund of Knowledge:  Good  Language:  Good  Akathisia:  Negative  Handed:  Right  AIMS (if indicated):     Assets:  Desire for Improvement Resilience  ADL's:  Intact  Cognition:  WNL  Sleep:         COGNITIVE FEATURES THAT CONTRIBUTE TO RISK:  None    SUICIDE RISK:   Minimal: No identifiable suicidal ideation.  Patients presenting with no risk factors but with morbid ruminations;  may be classified as minimal risk based on the severity of the depressive symptoms  PLAN OF CARE: Patient is seen and examined.  Patient is a 33 year old female with a longstanding history of alcohol dependence with complicated withdrawal symptoms  including auditory visual hallucinations and some paranoia.  It was attempted to detox her with Librium as an outpatient, but this failed.  She will be admitted to the hospital.  She will be integrated into the milieu.  She will be encouraged to attend groups.  She will be placed on the lorazepam withdrawal protocol.  She will receive 1 mg p.o. every 6 hours as needed a CIWA greater than 10.  We will also write for Risperdal 0.5 mg p.o. every 8 hours as needed hallucinations.  The patient currently denies any active hallucinations, but I have informed the patient that if these return she should contact nursing staff immediately.  She will also receive folic acid as well as thiamine.  She currently is already signed a 72-hour release letter, and have discussed this with the patient.  I explained to her the life-threatening withdrawal possibility of alcohol, and she understands this more fully.  She will be placed on 15-minute checks, and I will also place her on seizure precautions given the nature of this.  I certify that inpatient services furnished can reasonably be expected to improve the patient's condition.   Antonieta Pert, MD 01/04/2019, 2:37 PM

## 2019-01-04 NOTE — ED Notes (Signed)
TTS not completed. BHH States that pt unable to talk with her and can't answer questions without falling asleep. States that she will have BHH reassess in the morning.

## 2019-01-04 NOTE — ED Notes (Signed)
Unable to assess. Candy, RN, informed unable to assess, falling asleep and incoherent.

## 2019-01-04 NOTE — ED Notes (Signed)
Pt belongings brought over by RN in blue pod placed in locker 6.

## 2019-01-04 NOTE — Progress Notes (Signed)
D: Pt was in dayroom upon initial approach.  Pt presents with depressed affect and mood.  She brightens with interaction.  She reports her day has "been good."  Goal is to "start the process to a new life."  Pt denies SI/HI, denies hallucinations, denies pain.  Pt has been visible in milieu interacting with peers and staff appropriately.    A: Introduced self to pt.  Met with pt 1:1.  Actively listened to pt and offered support and encouragement.  PRN medication administered for sleep.  Q15 minute safety checks in place.  R: Pt is safe on the unit.  Pt is compliant with medication.  Pt verbally contracts for safety.  Will continue to monitor and assess.

## 2019-01-04 NOTE — ED Notes (Signed)
Breakfast tray ordered 

## 2019-01-04 NOTE — H&P (Signed)
Psychiatric Admission Assessment Adult  Patient Identification: Michele Rich MRN:  [id number] Date of Evaluation:  01/04/2019 Chief Complaint:  ALCOHOL INDUCED DEPRESSIVE DISORDER Principal Diagnosis: Alcohol dependence with alcohol-induced mood disorder (HCC) Diagnosis:  Principal Problem:   Alcohol dependence with alcohol-induced mood disorder (HCC)  History of Present Illness: Ms. Michele Rich is a 33 year old female with history of alcohol dependence, presenting for treatment of auditory, visual, and tactile hallucinations while withdrawing from alcohol. Chart notes indicate patient's last drink was 6 days ago. Patient reports last drink was two weeks ago. She had been drinking 1/5 of liquor a day plus several beers and quit cold [place]. She reports quitting so that she could be a better mother and avoid health problems. Denies health issues at this time. For the first several days after last drink, she had nausea, vomiting, abdominal cramping. After physical symptoms resolved, patient reports AVH and tactile hallucinations of bugs began. She states she was awake for 7 days straight and did not sleep or eat during that time. She went to the ED two days ago and was sent home with Librium at that time. She reports taking Librium at home but was still experiencing psychotic symptoms. Her mother brought her to the ED. Patient reports psychotic symptoms resolved after getting a full night's sleep in the ED last night. At this time she denies AVH and denies physical withdrawal symptoms as well. No signs of withdrawal evident and she does not appear to be responding to internal stimuli. She denies depression or anxiety, but does report some mild anhedonia, insomnia, and fatigue recently she believes related to her alcohol intake. Denies SI/HI.   Associated Signs/Symptoms: Depression Symptoms:  anhedonia, insomnia, fatigue, (Hypo) Manic Symptoms:  denies Anxiety Symptoms:  denies Psychotic Symptoms:   auditory, visual, and tactile hallucinations over the last several days while withdrawing from alcohol. Denies any psychotic symptoms since yesterday. PTSD Symptoms: Negative Total Time spent with patient: 30 minutes  Past Psychiatric History: Daily alcohol use for 5 years, with longest period of sobriety 90 days about one year ago. She reports history of quitting cold [place] in the past with physical withdrawal symptoms but no hallucinations. Denies history of depression, anxiety, psychotic or manic symptoms. Denies history of suicide attempts. Denies history of mental health or substance use hospitalizations or rehab. She has never seen a psychiatrist.  Is the patient at risk to self? Yes.    Has the patient been a risk to self in the past 6 months? No.  Has the patient been a risk to self within the distant past? No.  Is the patient a risk to others? No.  Has the patient been a risk to others in the past 6 months? No.  Has the patient been a risk to others within the distant past? No.   Prior Inpatient Therapy:   Prior Outpatient Therapy:    Alcohol Screening:   Substance Abuse History in the last 12 months:  Yes.   Consequences of Substance Abuse: Withdrawal Symptoms:   Cramps Diarrhea Nausea Tremors Vomiting Denies medical or legal consequences. States she has been experiencing conflict with her older son related to her alcohol intake. Previous Psychotropic Medications: No  Psychological Evaluations: No  Past Medical History:  Past Medical History:  Diagnosis Date  . HPV in female     Past Surgical History:  Procedure Laterality Date  . NO PAST SURGERIES     Family History: No family history on file. Family Psychiatric  History: Maternal grandfather  with alcoholism. She believes her brother may have problems with alcohol as well. Tobacco Screening:   Social History:  Social History   Substance and Sexual Activity  Alcohol Use Yes   Comment: Last drink: 4 days ago       Social History   Substance and Sexual Activity  Drug Use No    Additional Social History:                           Allergies:   Allergies  Allergen Reactions  . Apple Fruit Extract Hives   Lab Results:  Results for orders placed or performed during the hospital encounter of 01/03/19 (from the past 48 hour(s))  Pregnancy, urine     Status: None   Collection Time: 01/03/19 10:15 PM  Result Value Ref Range   Preg Test, Ur NEGATIVE NEGATIVE    Comment:        THE SENSITIVITY OF THIS METHODOLOGY IS >20 mIU/mL. Performed at [facility], [address], [place], [place] [address]   CBC with Differential/Platelet     Status: Abnormal   Collection Time: 01/03/19 10:20 PM  Result Value Ref Range   WBC 7.6 4.0 - 10.5 K/uL   RBC 3.65 (L) 3.87 - 5.11 MIL/uL   Hemoglobin 12.6 12.0 - 15.0 g/dL   HCT 60.4 54.0 - 98.1 %   MCV 100.5 (H) 80.0 - 100.0 fL   MCH 34.5 (H) 26.0 - 34.0 pg   MCHC 34.3 30.0 - 36.0 g/dL   RDW 19.1 47.8 - 29.5 %   Platelets  150 - 400 K/uL    PLATELET CLUMPS NOTED ON SMEAR, COUNT APPEARS DECREASED    Comment: Immature Platelet Fraction may be clinically indicated, consider ordering this additional test [id number]    nRBC 0.0 0.0 - 0.2 %   Neutrophils Relative % 76 %   Neutro Abs 5.8 1.7 - 7.7 K/uL   Lymphocytes Relative 18 %   Lymphs Abs 1.4 0.7 - 4.0 K/uL   Monocytes Relative 5 %   Monocytes Absolute 0.4 0.1 - 1.0 K/uL   Eosinophils Relative 1 %   Eosinophils Absolute 0.1 0.0 - 0.5 K/uL   Basophils Relative 0 %   Basophils Absolute 0.0 0.0 - 0.1 K/uL   Immature Granulocytes 0 %   Abs Immature Granulocytes 0.02 0.00 - 0.07 K/uL    Comment: Performed at [facility], [address], [place], [place] [address]  Comprehensive metabolic panel     Status: Abnormal   Collection Time: 01/03/19 10:20 PM  Result Value Ref Range   Sodium 138 135 - 145 mmol/L   Potassium 2.8 (L) 3.5 - 5.1 mmol/L   Chloride 102 98 - 111 mmol/L   CO2 24  22 - 32 mmol/L   Glucose, Bld 118 (H) 70 - 99 mg/dL   BUN 8 6 - 20 mg/dL   Creatinine, Ser 6.29 0.44 - 1.00 mg/dL   Calcium 9.3 8.9 - 52.8 mg/dL   Total Protein 7.5 6.5 - 8.1 g/dL   Albumin 4.0 3.5 - 5.0 g/dL   AST 99 (H) 15 - 41 U/L   ALT 55 (H) 0 - 44 U/L   Alkaline Phosphatase 88 38 - 126 U/L   Total Bilirubin 1.3 (H) 0.3 - 1.2 mg/dL   GFR calc non Af Amer >60 >60 mL/min   GFR calc Af Amer >60 >60 mL/min   Anion gap 12 5 -  15    Comment: Performed at [facility], [address], [place], [place] [address]  Salicylate level     Status: None   Collection Time: 01/03/19 10:20 PM  Result Value Ref Range   Salicylate Lvl 7.8 2.8 - 30.0 mg/dL    Comment: Performed at [facility], [address], [place], [place] [address]  Acetaminophen level     Status: Abnormal   Collection Time: 01/03/19 10:20 PM  Result Value Ref Range   Acetaminophen (Tylenol), Serum <10 (L) 10 - 30 ug/mL    Comment: (NOTE) Therapeutic concentrations vary significantly. A range of 10-30 ug/mL  may be an effective concentration for many patients. However, some  are best treated at concentrations outside of this range. Acetaminophen concentrations >150 ug/mL at 4 hours after ingestion  and >50 ug/mL at 12 hours after ingestion are often associated with  toxic reactions. Performed at [facility], [address] [place], [place] [address]   Ethanol     Status: None   Collection Time: 01/03/19 10:20 PM  Result Value Ref Range   Alcohol, Ethyl (B) <10 <10 mg/dL    Comment: (NOTE) Lowest detectable limit for serum alcohol is 10 mg/dL. For medical purposes only. Performed at [facility], [address], [place], [place] [address]   I-Stat beta hCG blood, ED     Status: None   Collection Time: 01/03/19 10:23 PM  Result Value Ref Range   I-stat hCG, quantitative <5.0 <5 mIU/mL   Comment 3            Comment:   GEST. AGE      CONC.  (mIU/mL)   <=1 WEEK        5 - 50     2 WEEKS        50 - 500     3 WEEKS       100 - 10,000     4 WEEKS     1,000 - 30,000        FEMALE AND NON-PREGNANT FEMALE:     LESS THAN 5 mIU/mL   Rapid urine drug screen (hospital performed)     Status: Abnormal   Collection Time: 01/03/19 10:46 PM  Result Value Ref Range   Opiates NONE DETECTED NONE DETECTED   Cocaine NONE DETECTED NONE DETECTED   Benzodiazepines POSITIVE (A) NONE DETECTED   Amphetamines NONE DETECTED NONE DETECTED   Tetrahydrocannabinol NONE DETECTED NONE DETECTED   Barbiturates NONE DETECTED NONE DETECTED    Comment: (NOTE) DRUG SCREEN FOR MEDICAL PURPOSES ONLY.  IF CONFIRMATION IS NEEDED FOR ANY PURPOSE, NOTIFY LAB WITHIN 5 DAYS. LOWEST DETECTABLE LIMITS FOR URINE DRUG SCREEN Drug Class                     Cutoff (ng/mL) Amphetamine and metabolites    1000 Barbiturate and metabolites    200 Benzodiazepine                 200 Tricyclics and metabolites     300 Opiates and metabolites        300 Cocaine and metabolites        300 THC                            50 Performed at [facility], [address], [place], [place] [address]   SARS Coronavirus 2 (  CEPHEID - Performed in [facility] hospital lab), Hosp Order     Status: None   Collection Time: 01/04/19 11:23 AM  Result Value Ref Range   SARS Coronavirus 2 NEGATIVE NEGATIVE    Comment: (NOTE) If result is NEGATIVE SARS-CoV-2 target nucleic acids are NOT DETECTED. The SARS-CoV-2 RNA is generally detectable in upper and lower  respiratory specimens during the acute phase of infection. The lowest  concentration of SARS-CoV-2 viral copies this assay can detect is 250  copies / mL. A negative result does not preclude SARS-CoV-2 infection  and should not be used as the sole basis for treatment or other  patient management decisions.  A negative result may occur with  improper specimen collection / handling, submission of specimen other  than nasopharyngeal swab, presence of viral mutation(s) within the   areas targeted by this assay, and inadequate number of viral copies  (<250 copies / mL). A negative result must be combined with clinical  observations, patient history, and epidemiological information. If result is POSITIVE SARS-CoV-2 target nucleic acids are DETECTED. The SARS-CoV-2 RNA is generally detectable in upper and lower  respiratory specimens dur ing the acute phase of infection.  Positive  results are indicative of active infection with SARS-CoV-2.  Clinical  correlation with patient history and other diagnostic information is  necessary to determine patient infection status.  Positive results do  not rule out bacterial infection or co-infection with other viruses. If result is PRESUMPTIVE POSTIVE SARS-CoV-2 nucleic acids MAY BE PRESENT.   A presumptive positive result was obtained on the submitted specimen  and confirmed on repeat testing.  While 2019 novel coronavirus  (SARS-CoV-2) nucleic acids may be present in the submitted sample  additional confirmatory testing may be necessary for epidemiological  and / or clinical management purposes  to differentiate between  SARS-CoV-2 and other Sarbecovirus currently known to infect humans.  If clinically indicated additional testing with an alternate test  methodology [phone]([id number]) is advised. The SARS-CoV-2 RNA is generally  detectable in upper and lower respiratory sp ecimens during the acute  phase of infection. The expected result is Negative. Fact Sheet for Patients:  BoilerBrush.com.cyhttps://www.fda.gov/media/136312/download Fact Sheet for Healthcare Providers: https://pope.com/https://www.fda.gov/media/136313/download This test is not yet approved or cleared by the [place] FDA and has been authorized for detection and/or diagnosis of SARS-CoV-2 by FDA under an Emergency Use Authorization (EUA).  This EUA will remain in effect (meaning this test can be used) for the duration of the COVID-19 declaration under Section 564(b)(1) of the Act, 21  U.S.C. section 360bbb-3(b)(1), unless the authorization is terminated or revoked sooner. Performed at [facility], [address] [place], [place] [address]     Blood Alcohol level:  Lab Results  Component Value Date   ETH <10 01/03/2019    Metabolic Disorder Labs:  No results found for: HGBA1C, MPG No results found for: PROLACTIN No results found for: CHOL, TRIG, HDL, CHOLHDL, VLDL, LDLCALC  Current Medications: Current Facility-Administered Medications  Medication Dose Route Frequency Provider Last Rate Last Dose  . acetaminophen (TYLENOL) tablet 650 mg  650 mg Oral Q6H PRN Antonieta Pertlary, Greg Lawson, MD      . alum & mag hydroxide-simeth (MAALOX/MYLANTA) 200-200-20 MG/5ML suspension 30 mL  30 mL Oral Q4H PRN Antonieta Pertlary, Greg Lawson, MD      . folic acid (FOLVITE) tablet 1 mg  1 mg Oral Daily Antonieta Pertlary, Greg Lawson, MD      . hydrOXYzine (ATARAX/VISTARIL) tablet 25 mg  25  mg Oral Q6H PRN Antonieta Pert, MD      . LORazepam (ATIVAN) tablet 1 mg  1 mg Oral Q6H PRN Antonieta Pert, MD      . potassium chloride SA (K-DUR) CR tablet 20 mEq  20 mEq Oral Once Antonieta Pert, MD      . risperiDONE (RISPERDAL) tablet 1 mg  1 mg Oral Q8H PRN Aldean Baker, NP      . thiamine (VITAMIN B-1) tablet 100 mg  100 mg Oral Daily Antonieta Pert, MD      . traZODone (DESYREL) tablet 50 mg  50 mg Oral QHS PRN Antonieta Pert, MD       PTA Medications: Medications Prior to Admission  Medication Sig Dispense Refill Last Dose  . chlordiazePOXIDE (LIBRIUM) 25 MG capsule 50mg  PO TID x 1D, then 25-50mg  PO BID X 1D, then 25-50mg  PO QD X 1D 10 capsule 0   . diphenhydrAMINE (BENADRYL) 25 mg capsule Take 25 mg by mouth every 6 (six) hours as needed for itching.   Past Month at Unknown time  . Ibuprofen-diphenhydrAMINE HCl (ADVIL PM) 200-25 MG CAPS Take 1 capsule by mouth at bedtime as needed (sleep).   Past Month at Unknown time    Musculoskeletal: Strength & Muscle Tone: within normal  limits Gait & Station: normal Patient leans: N/A  Psychiatric Specialty Exam: Physical Exam  Nursing note and vitals reviewed. Constitutional: She is oriented to person, place, and time. She appears well-developed and well-nourished.  Cardiovascular: Normal rate.  Respiratory: Effort normal.  Neurological: She is alert and oriented to person, place, and time.    Review of Systems  Constitutional: Negative.   Respiratory: Negative for cough and shortness of breath.   Cardiovascular: Negative for chest pain.  Gastrointestinal: Negative for abdominal pain, diarrhea, nausea and vomiting.  Musculoskeletal: Negative for myalgias.  Neurological: Negative for tremors, sensory change and headaches.  Psychiatric/Behavioral: Positive for substance abuse (ETOH). Negative for depression, hallucinations and suicidal ideas. The patient has insomnia. The patient is not nervous/anxious.     Blood pressure 127/90, pulse (!) 101, temperature 99.1 F (37.3 C), temperature source Oral, resp. rate 18, SpO2 100 %.There is no height or weight on file to calculate BMI.  See MD's admission SRA    Treatment Plan Summary: Daily contact with patient to assess and evaluate symptoms and progress in treatment and Medication management   Inpatient hospitalization.  See MD's admission SRA for medication management.  Patient will participate in the therapeutic group milieu.  Discharge disposition in progress.   Observation Level/Precautions:  15 minute checks  Laboratory:  CMP a1c lipid panel TSH  Psychotherapy:  Group therapy  Medications:  See MAR  Consultations:  PRN  Discharge Concerns:  Safety and stabilization  Estimated LOS: 3-5 days  Other:      Physician Treatment Plan for Primary Diagnosis: Alcohol dependence with alcohol-induced mood disorder (HCC) Long Term Goal(s): Improvement in symptoms so as ready for discharge  Short Term Goals: Ability to identify changes in lifestyle to reduce  recurrence of condition will improve and Ability to demonstrate self-control will improve  Physician Treatment Plan for Secondary Diagnosis: Principal Problem:   Alcohol dependence with alcohol-induced mood disorder (HCC)  Long Term Goal(s): Improvement in symptoms so as ready for discharge  Short Term Goals: Ability to identify and develop effective coping behaviors will improve and Ability to identify triggers associated with substance abuse/mental health issues will improve  I certify that inpatient  services furnished can reasonably be expected to improve the patient's condition.    Aldean Baker, NP 5/21/20202:53 PM

## 2019-01-04 NOTE — ED Notes (Signed)
Pt discharged. Plan reviewed with pt, pt verbalized understanding. Belongings given to [employer]. Opportunity for phone call to family to notify them of plan was provided, pt declined.

## 2019-01-04 NOTE — ED Notes (Signed)
Patient wearing maroon paper scrubs , denies pain/respirations unlabored , IV  site intact , NS IV bolus infusing . No hallucinations at this time , denies SI or HI .

## 2019-01-04 NOTE — ED Notes (Signed)
Patient denies pain and is resting comfortably.  

## 2019-01-04 NOTE — Progress Notes (Signed)
Per MD: "Subjective Data: Patient is seen and examined.  Patient is a 33 year old female with a past psychiatric history significant for alcohol dependence who presented to the [facility] emergency department on 01/04/2019 with worsening alcohol withdrawal symptoms.  The patient had presented originally on 01/02/2019 to the emergency department and received an outpatient Librium taper.  Unfortunately the patient began to develop worsening alcohol withdrawal symptoms.  She began to see bugs, hear voices and feel agitated.  She had had periods of time in the past where she had abruptly stopped her alcohol intake, and had never had symptoms similar to this.  This became quite frightening to her, and so she returned to the emergency department.  She admitted to insomnia, agitation, hallucinations as per above, hopelessness and irritability.  She denied any suicidal or homicidal ideation.  Secondary to the worsening alcohol withdrawal symptoms and the potential for life-threatening withdrawal she was admitted to the hospital for evaluation and stabilization."  Patient was searched. No contraband found; patient has multiple tattoos throughout body. Safety is maintained with 15 minute checks. Will continue to monitor and assess.

## 2019-01-04 NOTE — ED Notes (Signed)
TTS in progress 

## 2019-01-04 NOTE — Progress Notes (Signed)
Patient has been accepted to [facility] on a voluntary basis.   Accepting Provider: Dr. Jola Babinski  RN Call for Report: [phone]  Bed: 304-1  Enid Cutter, LCSW-A Clinical Social Worker [phone]

## 2019-01-04 NOTE — ED Notes (Signed)
Pt states she does not want ED staff to contact family.

## 2019-01-04 NOTE — Progress Notes (Signed)
Adult Psychoeducational Group Note  Date:  01/04/2019 Time:  9:06 PM  Group Topic/Focus:  Wrap-Up Group:   The focus of this group is to help patients review their daily goal of treatment and discuss progress on daily workbooks.  Participation Level:  Active  Participation Quality:  Appropriate  Affect:  Appropriate  Cognitive:  Appropriate  Insight: Appropriate  Engagement in Group:  Improving  Modes of Intervention:  Discussion  Additional Comments:  Pt stated her goal was to get her recovery process.  Pt stated coming here means that her goal was met.  Pt rated the day at a 8/10.  Arad Burston 01/04/2019, 9:06 PM

## 2019-01-04 NOTE — ED Notes (Addendum)
Spoke with pt's mother Gave update  Pt's mother request we keep patient until she is no longer having hallucinations.  Spoke with Counsellor (off-going RN) and patient was appropriate with her during her shift.

## 2019-01-04 NOTE — ED Notes (Signed)
Berneice Gandy pts mother wants an update soon as possible @ [phone]

## 2019-01-04 NOTE — BH Assessment (Signed)
Tele Assessment Note   Patient Name: Michele Rich MRN: [id number] Referring Physician: Dahlia Client Location of Patient: [facility] ED Location of Provider: Behavioral Health TTS Department  Michele Rich is an 33 y.o. female presenting voluntarily to [facility] ED via EMS due to visual hallucinations and paranoia. Patient reports she accessed [facility] ED 2 days ago with a similar complaint. Patient states that she quit drinking alcohol 6 days ago and has been experiencing withdrawal symptoms. Patient was discharged from [facility] ED with ativan to assist with detox. Patient states that last night she "felt like I couldn't breath and I was under the impression someone was making me hallucinate." Patient reports feeling paranoid and experiencing AH of voices in the vent and VH of bugs since her discharge. She denies any current hallucinations or paranoia. She denies SI/HI. She reports not eating or sleeping for 6 days. She denies any previous psych history and does not have an outpatient provider or therapist. However, admits feeling depressed at times. She endorses depressive symptoms including insomnia, anhedonia, hopelessness, and irritability. She denies any other substance use or criminal charges. Patient gave verbal consent for TTS to contact her mother, Michele Rich [phone]).  Per mother, Michele Rich: Patient has always been a "drinker" however since the coronavirus she has been laid off from work and is spending all of her time at home. She and her 59 year old son recently moved back in with her mother due to financial issues. Mother reports patient has been drinking at least 1 5th of vodka per day, along with wine or beer. Mother reports since patient was discharged from [facility] ED 2 days ago she has continued to hallucinate and demonstrate bizarre behavior. Mother reports she is delusional about grandparents being in the house and making her have hallucinations or otherwise trying to harm her. She also witnessed  patient walking around the house talking to inanimate objects, such as a broom, and stating there was a little puppy following her around. Yesterday patient told mother "What if these voices tell me to kill yall?" Mother states patient does not have any prior psych history but is concerned that this may be caused by something other than alcohol withdrawal. She is fearful that patient may hurt herself or someone else without appropriate treatment.  Patient is alert and oriented x 4. She is dressed in scrubs sitting upright in bed. Her speech is normal rate and rhythm. Patient's eye sight is fair and thoughts are organized. Patient's mood is apprehensive and her affect is congruent. Patient's insight, judgement, and impulse control are fair. Patient does not appear to be responding to internal stimuli or experiencing delusional thought content at time of assessment.  Diagnosis: F10.24 Alcohol-induced depressive disorder, with severe use disorder   F10.232 Alcohol withdrawal with perceptual disturbances  Past Medical History:  Past Medical History:  Diagnosis Date  . HPV in female     Past Michele History:  Procedure Laterality Date  . NO PAST SURGERIES      Family History: No family history on file.  Social History:  reports that she has been smoking. She has been smoking about 0.25 packs per day. She has never used smokeless tobacco. She reports current alcohol use. She reports that she does not use drugs.  Additional Social History:  Alcohol / Drug Use Pain Medications: see MAR Prescriptions: see MAR Over the Counter: see MAR History of alcohol / drug use?: Yes Longest period of sobriety (when/how long): 6 days Substance #1 Name  of Substance 1: Alcohol 1 - Age of First Use: teens 1 - Amount (size/oz): 1 5th 1 - Frequency: daily 1 - Duration: severak months 1 - Last Use / Amount: 12/29/2018  CIWA: CIWA-Ar BP: (!) 127/98 Pulse Rate: 84 Nausea and Vomiting: no nausea and no  vomiting Tactile Disturbances: none Tremor: no tremor Auditory Disturbances: not present Paroxysmal Sweats: no sweat visible Visual Disturbances: not present Anxiety: no anxiety, at ease Headache, Fullness in Head: none present Agitation: normal activity Orientation and Clouding of Sensorium: oriented and can do serial additions CIWA-Ar Total: 0 COWS:    Allergies:  Allergies  Allergen Reactions  . Apple Fruit Extract Hives    Home Medications: (Not in a Rich admission)   OB/GYN Status:  No LMP recorded.  General Assessment Data Assessment unable to be completed: Yes Reason for not completing assessment: (falling asleep and incoherent) Location of Assessment: [facility] TTS Assessment: In system Is this a Tele or Face-to-Face Assessment?: Tele Assessment Is this an Initial Assessment or a Re-assessment for this encounter?: Initial Assessment Patient Accompanied by:: N/A Language Other than English: No Living Arrangements: Other (Comment)(with mother) What gender do you identify as?: Female Marital status: Single Maiden name: Escalante Pregnancy Status: No Living Arrangements: Parent, Children Can pt return to current living arrangement?: Yes Admission Status: Voluntary Is patient capable of signing voluntary admission?: Yes Referral Source: Self/Family/Friend Insurance type: None     Crisis Care Plan Living Arrangements: Parent, Children Legal Guardian: (self) Name of Psychiatrist: none Name of Therapist: none  Education Status Is patient currently in school?: No Is the patient employed, unemployed or receiving disability?: Employed  Risk to self with the past 6 months Suicidal Ideation: No Has patient been a risk to self within the past 6 months prior to admission? : No Suicidal Intent: No Has patient had any suicidal intent within the past 6 months prior to admission? : No Is patient at risk for suicide?: No Suicidal Plan?: No Has patient had any suicidal  plan within the past 6 months prior to admission? : No Access to Means: No What has been your use of drugs/alcohol within the last 12 months?: daily alcohol use until 6 days ago Previous Attempts/Gestures: No How many times?: 0 Other Self Harm Risks: none noted Triggers for Past Attempts: None known Intentional Self Injurious Behavior: None Family Suicide History: No Recent stressful life event(s): Financial Problems, Recent negative physical changes Persecutory voices/beliefs?: No Depression: Yes Depression Symptoms: Insomnia, Loss of interest in usual pleasures, Feeling angry/irritable Substance abuse history and/or treatment for substance abuse?: No Suicide prevention information given to non-admitted patients: Not applicable  Risk to Others within the past 6 months Homicidal Ideation: No Does patient have any lifetime risk of violence toward others beyond the six months prior to admission? : No Thoughts of Harm to Others: No Current Homicidal Intent: No Current Homicidal Plan: No Access to Homicidal Means: No Identified Victim: none History of harm to others?: No Assessment of Violence: None Noted Violent Behavior Description: none noted Does patient have access to weapons?: No Criminal Charges Pending?: No Does patient have a court date: No Is patient on probation?: No  Psychosis Hallucinations: Auditory, Visual Delusions: Persecutory  Mental Status Report Appearance/Hygiene: In scrubs Eye Contact: Good Motor Activity: Freedom of movement Speech: Logical/coherent Level of Consciousness: Alert Mood: Apprehensive, Sad Affect: Appropriate to circumstance Anxiety Level: Minimal Thought Processes: Coherent, Relevant Judgement: Partial Orientation: Person, Place, Time, Situation Obsessive Compulsive Thoughts/Behaviors: None  Cognitive Functioning Concentration:  Normal Memory: Recent Intact, Remote Intact Is patient IDD: No Insight: Fair Impulse Control:  Fair Appetite: Poor Have you had any weight changes? : No Change Sleep: Decreased Total Hours of Sleep: 0 Vegetative Symptoms: None  ADLScreening [facility] Assessment Services) Patient's cognitive ability adequate to safely complete daily activities?: Yes Patient able to express need for assistance with ADLs?: No Independently performs ADLs?: Yes (appropriate for developmental age)  Prior Inpatient Therapy Prior Inpatient Therapy: No  Prior Outpatient Therapy Prior Outpatient Therapy: No Does patient have an ACCT team?: No Does patient have Intensive In-House Services?  : No Does patient have [facility] services? : No Does patient have P4CC services?: No  ADL Screening (condition at time of admission) Patient's cognitive ability adequate to safely complete daily activities?: Yes Is the patient deaf or have difficulty hearing?: No Does the patient have difficulty seeing, even when wearing glasses/contacts?: No Does the patient have difficulty concentrating, remembering, or making decisions?: Yes Patient able to express need for assistance with ADLs?: No Does the patient have difficulty dressing or bathing?: No Independently performs ADLs?: Yes (appropriate for developmental age) Does the patient have difficulty walking or climbing stairs?: No Weakness of Legs: None Weakness of Arms/Hands: None  Home Assistive Devices/Equipment Home Assistive Devices/Equipment: None  Therapy Consults (therapy consults require a physician order) PT Evaluation Needed: No OT Evalulation Needed: No SLP Evaluation Needed: No Abuse/Neglect Assessment (Assessment to be complete while patient is alone) Abuse/Neglect Assessment Can Be Completed: Yes Physical Abuse: Denies Verbal Abuse: Denies Sexual Abuse: Denies Exploitation of patient/patient's resources: Denies Self-Neglect: Denies Values / Beliefs Cultural Requests During Hospitalization: None Spiritual Requests During Hospitalization:  None Consults Spiritual Care Consult Needed: No Social Work Consult Needed: No Merchant navy officerAdvance Directives (For Healthcare) Does Patient Have a Medical Advance Directive?: No Would patient like information on creating a medical advance directive?: No - Patient declined          Disposition: Denzil MagnusonLashunda Thomas, NP recommends in patient treatment. Disposition Initial Assessment Completed for this Encounter: Yes  This service was provided via telemedicine using a 2-way, interactive audio and video technology.  Names of all persons participating in this telemedicine service and their role in this encounter. Name: Michele MiyamotoMeredith Ulonda Klosowski, LCSW Role: TTS  Name: Michele Rich Role: patient  Name:  Role:   Name:      Michele MiyamotoMeredith  Cayetano Mikita 01/04/2019 9:59 AM

## 2019-01-04 NOTE — Tx Team (Signed)
Initial Treatment Plan 01/04/2019 5:07 PM LEILANEE MORETTA BBC:[id number]    PATIENT STRESSORS: Health problems Substance abuse   PATIENT STRENGTHS: Ability for insight Average or above average intelligence Motivation for treatment/growth   PATIENT IDENTIFIED PROBLEMS: "work on my emotions"  "I get mad/sad easily"                   DISCHARGE CRITERIA:  Ability to meet basic life and health needs Adequate post-discharge living arrangements Improved stabilization in mood, thinking, and/or behavior  PRELIMINARY DISCHARGE PLAN: Outpatient therapy  PATIENT/FAMILY INVOLVEMENT: This treatment plan has been presented to and reviewed with the patient, ARLYCE WOLLARD. The patient and family have been given the opportunity to ask questions and make suggestions.  Dewayne Shorter, RN 01/04/2019, 5:07 PM

## 2019-01-04 NOTE — [facility] Group Notes (Signed)
[facility] LCSW Group Therapy Note  Date/Time: 01/04/2019 @ 1:30pm  Type of Therapy/Topic:  Group Therapy:  Feelings about Diagnosis  Participation Level:  Did Not Attend   Mood: Did not attend   Description of Group:    This group will allow patients to explore their thoughts and feelings about diagnoses they have received. Patients will be guided to explore their level of understanding and acceptance of these diagnoses. Facilitator will encourage patients to process their thoughts and feelings about the reactions of others to their diagnosis, and will guide patients in identifying ways to discuss their diagnosis with significant others in their lives. This group will be process-oriented, with patients participating in exploration of their own experiences as well as giving and receiving support and challenge from other group members.   Therapeutic Goals: 1. Patient will demonstrate understanding of diagnosis as evidence by identifying two or more symptoms of the disorder:  2. Patient will be able to express two feelings regarding the diagnosis 3. Patient will demonstrate ability to communicate their needs through discussion and/or role plays  Summary of Patient Progress:    Pt did not attend group.     Therapeutic Modalities:   Cognitive Behavioral Therapy Brief Therapy Feelings Identification   Lori Liew, LCSW  

## 2019-01-05 LAB — HEMOGLOBIN A1C
Hgb A1c MFr Bld: 5.1 % (ref 4.8–5.6)
Mean Plasma Glucose: 99.67 mg/dL

## 2019-01-05 LAB — COMPREHENSIVE METABOLIC PANEL
ALT: 58 U/L — ABNORMAL HIGH (ref 0–44)
AST: 78 U/L — ABNORMAL HIGH (ref 15–41)
Albumin: 3.7 g/dL (ref 3.5–5.0)
Alkaline Phosphatase: 69 U/L (ref 38–126)
Anion gap: 10 (ref 5–15)
BUN: 7 mg/dL (ref 6–20)
CO2: 20 mmol/L — ABNORMAL LOW (ref 22–32)
Calcium: 8.7 mg/dL — ABNORMAL LOW (ref 8.9–10.3)
Chloride: 109 mmol/L (ref 98–111)
Creatinine, Ser: 0.66 mg/dL (ref 0.44–1.00)
GFR calc Af Amer: >60 mL/min (ref >60)
GFR calc non Af Amer: >60 mL/min (ref >60)
Glucose, Bld: 92 mg/dL (ref 70–99)
Potassium: 3.3 mmol/L — ABNORMAL LOW (ref 3.5–5.1)
Sodium: 139 mmol/L (ref 135–145)
Total Bilirubin: 0.8 mg/dL (ref 0.3–1.2)
Total Protein: 7 g/dL (ref 6.5–8.1)

## 2019-01-05 LAB — TSH: TSH: 1.685 u[IU]/mL (ref 0.350–4.500)

## 2019-01-05 LAB — LIPID PANEL
Cholesterol: 148 mg/dL (ref 0–200)
HDL: 38 mg/dL — ABNORMAL LOW (ref >40)
LDL Cholesterol: 85 mg/dL (ref 0–99)
Total CHOL/HDL Ratio: 3.9 RATIO
Triglycerides: 127 mg/dL (ref <150)
VLDL: 25 mg/dL (ref 0–40)

## 2019-01-05 MED ORDER — TRAZODONE HCL 50 MG PO TABS: 50.0000 mg | ORAL_TABLET | Freq: Every evening | ORAL | 0 refills | Status: DC | PRN

## 2019-01-05 MED ORDER — FOLIC ACID 1 MG PO TABS: 1.0000 mg | ORAL_TABLET | Freq: Every day | ORAL | 0 refills | Status: DC

## 2019-01-05 MED ORDER — NICOTINE POLACRILEX 2 MG MT GUM: 2.0000 mg | CHEWING_GUM | OROMUCOSAL | 0 refills | Status: DC | PRN

## 2019-01-05 MED ORDER — THIAMINE HCL 100 MG PO TABS: 100.0000 mg | ORAL_TABLET | Freq: Every day | ORAL | 0 refills | Status: DC

## 2019-01-05 MED ORDER — HYDROXYZINE HCL 25 MG PO TABS: 25.0000 mg | ORAL_TABLET | Freq: Four times a day (QID) | ORAL | 0 refills | Status: DC | PRN

## 2019-01-05 NOTE — Progress Notes (Signed)
[place] NOVEL CORONAVIRUS (COVID-19) DAILY CHECK-OFF SYMPTOMS - answer yes or no to each - every day NO YES  Have you had a fever in the past 24 hours?  . Fever (Temp > 37.80C / 100F) X   Have you had any of these symptoms in the past 24 hours? . New Cough .  Sore Throat  .  Shortness of Breath .  Difficulty Breathing .  Unexplained Body Aches   X   Have you had any one of these symptoms in the past 24 hours not related to allergies?   . Runny Nose .  Nasal Congestion .  Sneezing   X   If you have had runny nose, nasal congestion, sneezing in the past 24 hours, has it worsened?  X   EXPOSURES - check yes or no X   Have you traveled outside the state in the past 14 days?  X   Have you been in contact with someone with a confirmed diagnosis of COVID-19 or PUI in the past 14 days without wearing appropriate PPE?  X   Have you been living in the same home as a person with confirmed diagnosis of COVID-19 or a PUI (household contact)?    X   Have you been diagnosed with COVID-19?    X              What to do next: Answered NO to all: Answered YES to anything:   Proceed with unit schedule Follow the [facility] Inpatient Flowsheet.   

## 2019-01-05 NOTE — [facility] Suicide Risk Assessment (Signed)
[facility] INPATIENT:  Family/Significant Other Suicide Prevention Education  Suicide Prevention Education:  Education Completed; Pt's mother, Michele Rich, has been identified by the patient as the family member/significant other with whom the patient will be residing, and identified as the person(s) who will aid the patient in the event of a mental health crisis (suicidal ideations/suicide attempt).  With written consent from the patient, the family member/significant other has been provided the following suicide prevention education, prior to the and/or following the discharge of the patient.  The suicide prevention education provided includes the following:  Suicide risk factors  Suicide prevention and interventions  National Suicide Hotline telephone number  [facility] assessment telephone number  [facility] Emergency Assistance 911  [facility] and/or Residential Mobile Crisis Unit telephone number  Request made of family/significant other to:  Remove weapons (e.g., guns, rifles, knives), all items previously/currently identified as safety concern.    Remove drugs/medications (over-the-counter, prescriptions, illicit drugs), all items previously/currently identified as a safety concern.  The family member/significant other verbalizes understanding of the suicide prevention education information provided.  The family member/significant other agrees to remove the items of safety concern listed above.   CSW received a call back from the pt's mother, Michele Rich. Pt's mother reports that she has no concerns. She just wants the pt to be okay and make sure she is good and not seeing hallucinations. Pt's mother reports that she will be going back home with her. Pt's mother reports that she talked to the pt about an hour ago and she feels that she sounds like herself again.   Delphia Grates 01/05/2019, 1:22 PM
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
## 2019-01-05 NOTE — BHH Group Notes (Signed)
LCSW Group Therapy Note    Date/Time: 01/05/2019 @ 1:30pm Mood: Pleasant Type of Therapy and Topic: Group Therapy: Avoiding Self-Sabotaging and Enabling Behaviors Participation Level: Active Description of Group:  In this group, patients will learn how to identify obstacles, self-sabotaging and enabling behaviors, as well as: what are they, why do we do them and what needs these behaviors meet. Discuss unhealthy relationships and how to have positive healthy boundaries with those that sabotage and enable. Explore aspects of self-sabotage and enabling in yourself and how to limit these self-destructive behaviors in everyday life.   Therapeutic Goals: 1. Patient will identify one obstacle that relates to self-sabotage and enabling behaviors 2. Patient will identify one personal self-sabotaging or enabling behavior they did prior to admission 3. Patient will state a plan to change the above identified behavior 4. Patient will demonstrate ability to communicate their needs through discussion and/or role play.     Summary of Patient Progress:  Pt was active and engaged throughout group. Pt was able to identify obstacles in her life right now and identified self-sabotaging and enabling behaviors that she exhibits. Pt reports that she thinks too much in her head. Pt reports that some of her self-sabotaging and enabling behaviors are alcohol and procrastinating. Pt reports that drinking takes her away from the problem. Pt reports that she can limit these self-sabotaging behaviors by trying to get stable because she wants to be better and she wants to get help.     Therapeutic Modalities:  Cognitive Behavioral Therapy Person-Centered Therapy Motivational Interviewing    [place], [place]

## 2019-01-05 NOTE — BHH Suicide Risk Assessment (Signed)
[facility] Discharge Suicide Risk Assessment   Principal Problem: Alcohol dependence with alcohol-induced mood disorder (HCC) Discharge Diagnoses: Principal Problem:   Alcohol dependence with alcohol-induced mood disorder (HCC)   Total Time spent with patient: 15 minutes  Musculoskeletal: Strength & Muscle Tone: within normal limits Gait & Station: normal Patient leans: N/A  Psychiatric Specialty Exam: Review of Systems  All other systems reviewed and are negative.   Blood pressure (!) 136/92, pulse 96, temperature 98.1 F (36.7 C), resp. rate 18, height 5\' 5"  (1.651 m), weight 89.8 kg, SpO2 100 %.Body mass index is 32.94 kg/m.  General Appearance: Casual  Eye Contact::  Good  Speech:  Normal Rate409  Volume:  Normal  Mood:  Euthymic  Affect:  Congruent  Thought Process:  Coherent and Descriptions of Associations: Intact  Orientation:  Full (Time, Place, and Person)  Thought Content:  Logical  Suicidal Thoughts:  No  Homicidal Thoughts:  No  Memory:  Immediate;   Fair Recent;   Fair Remote;   Fair  Judgement:  Fair  Insight:  Fair  Psychomotor Activity:  Normal  Concentration:  Good  Recall:  Good  Fund of Knowledge:Good  Language: Good  Akathisia:  Negative  Handed:  Right  AIMS (if indicated):     Assets:  Desire for Improvement Resilience  Sleep:  Number of Hours: 6.25  Cognition: WNL  ADL's:  Intact   Mental Status Per Nursing Assessment::   On Admission:  NA  Demographic Factors:  NA  Loss Factors: Financial problems/change in socioeconomic status  Historical Factors: Impulsivity  Risk Reduction Factors:   Living with another person, especially a relative  Continued Clinical Symptoms:  Alcohol/Substance Abuse/Dependencies  Cognitive Features That Contribute To Risk:  None    Suicide Risk:  Minimal: No identifiable suicidal ideation.  Patients presenting with no risk factors but with morbid ruminations; may be classified as minimal risk based on  the severity of the depressive symptoms    Plan Of Care/Follow-up recommendations:  Activity:  ad lib  Antonieta Pert, MD 01/05/2019, 1:01 PM

## 2019-01-05 NOTE — Progress Notes (Signed)
Per dr order, pt given dc instrucitons by this Clinical research associate. She denies SI today, wrote this on her daily invnetory this am and rated her dperession, hopelessness and anxeity "1/0/1", respectively. SHe stated verbal understanding of dc instrucitons and willingness to comply. All belongings were returned to her and she was given cc of dc instrucitons ( SRA, AVS, SSP and trnasition record). She was escorted to bldg entrance and dc'd ambulatory per MD order.

## 2019-01-05 NOTE — Plan of Care (Signed)
  Problem: Education: Goal: Knowledge of [facility] Education information/materials will improve Outcome: Progressing   

## 2019-01-05 NOTE — Progress Notes (Signed)
  [facility] Adult Case Management Discharge Plan :  Will you be returning to the same living situation after discharge:  Yes,  with mother At discharge, do you have transportation home?: Yes,  a friend Do you have the ability to pay for your medications: No.; [facility]  Release of information consent forms completed and in the chart;  Patient's signature needed at discharge.  Patient to Follow up at: Follow-up Information    [facility] Go on 01/09/2019.   Why:  You will receive a call from a therapist from [facility] on Tuesday, May 26th at 2:30pm. Your appointment will be via phone call.  Contact information: [address] [place] [address] [phone]        [facility] Follow up.   Why:  Primary care clinic for uninsured. Please call to schedule appointment for primary care. Contact information: [address] [place] [address] [phone]          Next level of care provider has access to [facility] Link:no  Safety Planning and Suicide Prevention discussed: Yes,  with mother  Have you used any form of tobacco in the last 30 days? (Cigarettes, Smokeless Tobacco, Cigars, and/or Pipes): Yes  Has patient been referred to the Quitline?: Patient refused referral  Patient has been referred for addiction treatment: Yes  Delphia Grates, LCSW 01/05/2019, 2:15 PM

## 2019-01-05 NOTE — Tx Team (Signed)
Interdisciplinary Treatment and Diagnostic Plan Update  01/05/2019 Time of Session: 9:02am Michele Rich MRN: [id number]  Principal Diagnosis: Alcohol dependence with alcohol-induced mood disorder [facility](HCC)  Secondary Diagnoses: Principal Problem:   Alcohol dependence with alcohol-induced mood disorder (HCC)   Current Medications:  Current Facility-Administered Medications  Medication Dose Route Frequency Provider Last Rate Last Dose  . acetaminophen (TYLENOL) tablet 650 mg  650 mg Oral Q6H PRN Antonieta Pertlary, Greg Lawson, MD      . alum & mag hydroxide-simeth (MAALOX/MYLANTA) 200-200-20 MG/5ML suspension 30 mL  30 mL Oral Q4H PRN Antonieta Pertlary, Greg Lawson, MD      . folic acid (FOLVITE) tablet 1 mg  1 mg Oral Daily Antonieta Pertlary, Greg Lawson, MD   1 mg at 01/05/19 0827  . hydrOXYzine (ATARAX/VISTARIL) tablet 25 mg  25 mg Oral Q6H PRN Antonieta Pertlary, Greg Lawson, MD   25 mg at 01/04/19 1811  . LORazepam (ATIVAN) tablet 1 mg  1 mg Oral Q6H PRN Antonieta Pertlary, Greg Lawson, MD      . nicotine polacrilex (NICORETTE) gum 2 mg  2 mg Oral PRN Antonieta Pertlary, Greg Lawson, MD      . risperiDONE (RISPERDAL) tablet 1 mg  1 mg Oral Q8H PRN Aldean BakerSykes, Janet E, NP      . thiamine (VITAMIN B-1) tablet 100 mg  100 mg Oral Daily Antonieta Pertlary, Greg Lawson, MD   100 mg at 01/05/19 0827  . traZODone (DESYREL) tablet 50 mg  50 mg Oral QHS PRN Antonieta Pertlary, Greg Lawson, MD   50 mg at 01/04/19 2105   PTA Medications: Medications Prior to Admission  Medication Sig Dispense Refill Last Dose  . chlordiazePOXIDE (LIBRIUM) 25 MG capsule 50mg  PO TID x 1D, then 25-50mg  PO BID X 1D, then 25-50mg  PO QD X 1D 10 capsule 0   . diphenhydrAMINE (BENADRYL) 25 mg capsule Take 25 mg by mouth every 6 (six) hours as needed for itching.   Past Month at Unknown time  . Ibuprofen-diphenhydrAMINE HCl (ADVIL PM) 200-25 MG CAPS Take 1 capsule by mouth at bedtime as needed (sleep).   Past Month at Unknown time    Patient Stressors: Health problems Substance abuse  Patient Strengths: Ability for  insight Average or above average intelligence Motivation for treatment/growth  Treatment Modalities: Medication Management, Group therapy, Case management,  1 to 1 session with clinician, Psychoeducation, Recreational therapy.   Physician Treatment Plan for Primary Diagnosis: Alcohol dependence with alcohol-induced mood disorder (HCC) Long Term Goal(s): Improvement in symptoms so as ready for discharge Improvement in symptoms so as ready for discharge   Short Term Goals: Ability to identify changes in lifestyle to reduce recurrence of condition will improve Ability to demonstrate self-control will improve Ability to identify and develop effective coping behaviors will improve Ability to identify triggers associated with substance abuse/mental health issues will improve  Medication Management: Evaluate patient's response, side effects, and tolerance of medication regimen.  Therapeutic Interventions: 1 to 1 sessions, Unit Group sessions and Medication administration.  Evaluation of Outcomes: Progressing  Physician Treatment Plan for Secondary Diagnosis: Principal Problem:   Alcohol dependence with alcohol-induced mood disorder (HCC)  Long Term Goal(s): Improvement in symptoms so as ready for discharge Improvement in symptoms so as ready for discharge   Short Term Goals: Ability to identify changes in lifestyle to reduce recurrence of condition will improve Ability to demonstrate self-control will improve Ability to identify and develop effective coping behaviors will improve Ability to identify triggers associated with substance abuse/mental health issues will improve  Medication Management: Evaluate patient's response, side effects, and tolerance of medication regimen.  Therapeutic Interventions: 1 to 1 sessions, Unit Group sessions and Medication administration.  Evaluation of Outcomes: Progressing   RN Treatment Plan for Primary Diagnosis: Alcohol dependence with  alcohol-induced mood disorder (HCC) Long Term Goal(s): Knowledge of disease and therapeutic regimen to maintain health will improve  Short Term Goals: Ability to participate in decision making will improve, Ability to verbalize feelings will improve, Ability to disclose and discuss suicidal ideas, Ability to identify and develop effective coping behaviors will improve and Compliance with prescribed medications will improve  Medication Management: RN will administer medications as ordered by provider, will assess and evaluate patient's response and provide education to patient for prescribed medication. RN will report any adverse and/or side effects to prescribing provider.  Therapeutic Interventions: 1 on 1 counseling sessions, Psychoeducation, Medication administration, Evaluate responses to treatment, Monitor vital signs and CBGs as ordered, Perform/monitor CIWA, COWS, AIMS and Fall Risk screenings as ordered, Perform wound care treatments as ordered.  Evaluation of Outcomes: Progressing   LCSW Treatment Plan for Primary Diagnosis: Alcohol dependence with alcohol-induced mood disorder (HCC) Long Term Goal(s): Safe transition to appropriate next level of care at discharge, Engage patient in therapeutic group addressing interpersonal concerns.  Short Term Goals: Engage patient in aftercare planning with referrals and resources and Increase skills for wellness and recovery  Therapeutic Interventions: Assess for all discharge needs, 1 to 1 time with Social worker, Explore available resources and support systems, Assess for adequacy in community support network, Educate family and significant other(s) on suicide prevention, Complete Psychosocial Assessment, Interpersonal group therapy.  Evaluation of Outcomes: Progressing   Progress in Treatment: Attending groups: No. Participating in groups: No. Taking medication as prescribed: Yes. Toleration medication: Yes. Family/Significant other  contact made: No, will contact:  will contact if consent is given Patient understands diagnosis: Yes. Discussing patient identified problems/goals with staff: Yes. Medical problems stabilized or resolved: Yes. Denies suicidal/homicidal ideation: Yes. Issues/concerns per patient self-inventory: No. Other:   New problem(s) identified: No, Describe:  None  New Short Term/Long Term Goal(s):  Patient Goals:  "To start the process towards recovery"  Discharge Plan or Barriers:   Reason for Continuation of Hospitalization: Depression Medication stabilization  Estimated Length of Stay: 1-2 days  Attendees: Patient: 01/05/2019  Physician: Dr. Landry Mellow, MD 01/05/2019   Nursing: Alexia Freestone, RN 01/05/2019  RN Care Manager: 01/05/2019   Social Worker: Stephannie Peters, LCSW 01/05/2019   Recreational Therapist:  01/05/2019   Other:  01/05/2019   Other:  01/05/2019   Other: 01/05/2019      Scribe for Treatment Team: Delphia Grates, LCSW 01/05/2019 9:36 AM

## 2019-01-05 NOTE — Progress Notes (Signed)
Recreation Therapy Notes  Date:  5.22.20 Time: 0930 Location: [address] Dayroom  Group Topic: Stress Management  Goal Area(s) Addresses:  Patient will identify positive stress management techniques. Patient will identify benefits of using stress management post d/c.  Behavioral Response:  Engaged  Intervention:  Stress Management  Activity :  Progressive Muscle Relaxation.  LRT introduced the stress management technique of pmr.  LRT read a script to lead patients through tensing then relaxation each muscle individually.  Patients were to follow along as script was read to engage in activity.  Education:  Stress Management, Discharge Planning.   Education Outcome: Acknowledges Education  Clinical Observations/Feedback:  Pt attended and participated in group.    Michele Rich, LRT/CTRS         Lillia Abed, Marche Hottenstein A 01/05/2019 11:01 AM

## 2019-01-05 NOTE — Discharge Summary (Signed)
Physician Discharge Summary Note  Patient:  Michele Rich is an 33 y.o., female MRN:  [id number] DOB:  February 21, 1986 Patient phone:  [phone] (home)  Patient address:   [address] [place] [address],  Total Time spent with patient: 15 minutes  Date of Admission:  01/04/2019 Date of Discharge: 01/05/19  Reason for Admission:  Alcohol withdrawal  Principal Problem: Alcohol dependence with alcohol-induced mood disorder [facility]) Discharge Diagnoses: Principal Problem:   Alcohol dependence with alcohol-induced mood disorder (HCC)   Past Psychiatric History: Daily alcohol use for 5 years, with longest period of sobriety 90 days about one year ago. She reports history of quitting cold [place] in the past with physical withdrawal symptoms but no hallucinations. Denies history of depression, anxiety, psychotic or manic symptoms. Denies history of suicide attempts. Denies history of mental health or substance use hospitalizations or rehab. She has never seen a psychiatrist.  Past Medical History:  Past Medical History:  Diagnosis Date  . HPV in female   . Medical history non-contributory     Past Surgical History:  Procedure Laterality Date  . NO PAST SURGERIES     Family History: History reviewed. No pertinent family history. Family Psychiatric  History: Maternal grandfather with alcoholism. She believes her brother may have problems with alcohol as well. Social History:  Social History   Substance and Sexual Activity  Alcohol Use Yes   Comment: daily     Social History   Substance and Sexual Activity  Drug Use No    Social History   Socioeconomic History  . Marital status: Single    Spouse name: Not on file  . Number of children: Not on file  . Years of education: Not on file  . Highest education level: Not on file  Occupational History  . Occupation: Human resources officer at [employer]  . Financial resource strain: Not on file  . Food insecurity:     Worry: Not on file    Inability: Not on file  . Transportation needs:    Medical: Not on file    Non-medical: Not on file  Tobacco Use  . Smoking status: Current Every Day Smoker    Packs/day: 0.25  . Smokeless tobacco: Never Used  Substance and Sexual Activity  . Alcohol use: Yes    Comment: daily  . Drug use: No  . Sexual activity: Yes    Birth control/protection: None  Lifestyle  . Physical activity:    Days per week: Not on file    Minutes per session: Not on file  . Stress: Not on file  Relationships  . Social connections:    Talks on phone: Not on file    Gets together: Not on file    Attends religious service: Not on file    Active member of club or organization: Not on file    Attends meetings of clubs or organizations: Not on file    Relationship status: Not on file  Other Topics Concern  . Not on file  Social History Narrative  . Not on file    Hospital Course:  From admission H&P: Michele Rich is a 33 year old female with history of alcohol dependence, presenting for treatment of auditory, visual, and tactile hallucinations while withdrawing from alcohol. Chart notes indicate patient's last drink was 6 days ago. Patient reports last drink was two weeks ago. She had been drinking 1/5 of liquor a day plus several beers and quit cold [place]. She reports quitting so that  she could be a better mother and avoid health problems. Denies health issues at this time. For the first several days after last drink, she had nausea, vomiting, abdominal cramping. After physical symptoms resolved, patient reports AVH and tactile hallucinations of bugs began. She states she was awake for 7 days straight and did not sleep or eat during that time. She went to the ED two days ago and was sent home with Librium at that time. She reports taking Librium at home but was still experiencing psychotic symptoms. Her mother brought her to the ED. Patient reports psychotic symptoms resolved after getting a  full night's sleep in the ED last night. At this time she denies AVH and denies physical withdrawal symptoms as well. No signs of withdrawal evident and she does not appear to be responding to internal stimuli. She denies depression or anxiety, but does report some mild anhedonia, insomnia, and fatigue recently she believes related to her alcohol intake. Denies SI/HI.   Michele Rich was admitted for alcohol withdrawal with auditory, visual and tactile hallucinations. She denied hallucinations or withdrawal symptoms on admission to [facility]. No signs of withdrawal evident. She remained on the [facility] unit for 1 day to monitor for withdrawal symptoms or signs of psychosis. She required no medications for detox. No agitated or disruptive behaviors on the unit. She has been calm and cooperative, showing no signs of paranoia or responding to internal stimuli. She has shown stable mood, affect, sleep, appetite, and interaction. She denies any SI/HI/AVH and contracts for safety. She is discharging on the medications listed below. She is provided with prescriptions. She agrees to follow up at [facility] (see below). Her family is picking her up for discharge home.  Physical Findings: AIMS:  , ,  ,  ,    CIWA:  CIWA-Ar Total: 0 COWS:     Musculoskeletal: Strength & Muscle Tone: within normal limits Gait & Station: normal Patient leans: N/A  Psychiatric Specialty Exam: Physical Exam  Nursing note and vitals reviewed. Constitutional: She is oriented to person, place, and time. She appears well-developed and well-nourished.  Cardiovascular: Normal rate.  Respiratory: Effort normal.  Neurological: She is alert and oriented to person, place, and time.    Review of Systems  Constitutional: Negative.   Respiratory: Negative for cough and shortness of breath.   Cardiovascular: Negative for chest pain.  Gastrointestinal: Negative for abdominal pain, diarrhea, nausea and vomiting.  Neurological: Negative for tingling,  sensory change and headaches.  Psychiatric/Behavioral: Positive for substance abuse (hx ETOH). Negative for depression, hallucinations and suicidal ideas. The patient is not nervous/anxious and does not have insomnia.     Blood pressure (!) 136/92, pulse 96, temperature 98.1 F (36.7 C), resp. rate 18, height  (1.651 m), weight 89.8 kg, SpO2 100 %.Body mass index is 32.94 kg/m.  See MD's discharge SRA     Have you used any form of tobacco in the last 30 days? (Cigarettes, Smokeless Tobacco, Cigars, and/or Pipes): Yes  Has this patient used any form of tobacco in the last 30 days? (Cigarettes, Smokeless Tobacco, Cigars, and/or Pipes) Yes, a prescription for an FDA-approved medication for tobacco cessation was offered at discharge.   Blood Alcohol level:  Lab Results  Component Value Date   ETH <10 01/03/2019    Metabolic Disorder Labs:  Lab Results  Component Value Date   HGBA1C 5.1 01/05/2019   MPG 99.67 01/05/2019   No results found for: PROLACTIN Lab Results  Component Value Date  CHOL 148 01/05/2019   TRIG 127 01/05/2019   HDL 38 (L) 01/05/2019   CHOLHDL 3.9 01/05/2019   VLDL 25 01/05/2019   LDLCALC 85 01/05/2019    See Psychiatric Specialty Exam and Suicide Risk Assessment completed by Attending Physician prior to discharge.  Discharge destination:  Home  Is patient on multiple antipsychotic therapies at discharge:  No   Has Patient had three or more failed trials of antipsychotic monotherapy by history:  No  Recommended Plan for Multiple Antipsychotic Therapies: NA  Discharge Instructions    Discharge instructions   Complete by:  As directed    Patient is instructed to take all prescribed medications as recommended. Report any side effects or adverse reactions to your outpatient psychiatrist. Patient is instructed to abstain from alcohol and illegal drugs while on prescription medications. In the event of worsening symptoms, patient is instructed to call  the crisis hotline, 911, or go to the nearest emergency department for evaluation and treatment.     Allergies as of 01/05/2019      Reactions   Apple Fruit Extract Hives      Medication List    STOP taking these medications   Advil PM 200-25 MG Caps Generic drug:  Ibuprofen-diphenhydrAMINE HCl   chlordiazePOXIDE 25 MG capsule Commonly known as:  LIBRIUM   diphenhydrAMINE 25 mg capsule Commonly known as:  BENADRYL     TAKE these medications     Indication  folic acid 1 MG tablet Commonly known as:  FOLVITE Take 1 tablet (1 mg total) by mouth daily. Start taking on:  Jan 06, 2019  Indication:  Supplementation   hydrOXYzine 25 MG tablet Commonly known as:  ATARAX/VISTARIL Take 1 tablet (25 mg total) by mouth every 6 (six) hours as needed for itching or anxiety.  Indication:  Feeling Anxious   nicotine polacrilex 2 MG gum Commonly known as:  NICORETTE Take 1 each (2 mg total) by mouth as needed for smoking cessation.  Indication:  Nicotine Addiction   thiamine 100 MG tablet Take 1 tablet (100 mg total) by mouth daily. Start taking on:  Jan 06, 2019  Indication:  Supplementation   traZODone 50 MG tablet Commonly known as:  DESYREL Take 1 tablet (50 mg total) by mouth at bedtime as needed for sleep.  Indication:  Trouble Sleeping      Follow-up [facility] Go on 01/09/2019.   Why:  You will receive a call from a therapist from [facility] on Tuesday, May 26th at 2:30pm. Your appointment will be via phone call.  Contact information: [address] [place] [phone]        [facility] Follow up.   Why:  Primary care clinic for uninsured. Please call to schedule appointment for primary care. Contact information: [address] [place] [phone]          Follow-up recommendations: Activity as tolerated. Diet as recommended by primary care physician. Keep all  scheduled follow-up appointments as recommended.   Comments:   Patient is instructed to take all prescribed medications as recommended. Report any side effects or adverse reactions to your outpatient psychiatrist. Patient is instructed to abstain from alcohol and illegal drugs while on prescription medications. In the event of worsening symptoms, patient is instructed to call the crisis hotline, 911, or go to the nearest emergency department for evaluation and treatment.  Signed: Aldean BakerJanet E Maikol Grassia, NP 01/05/2019, 1:37 PM

## 2019-01-05 NOTE — [facility] Suicide Risk Assessment (Signed)
[facility] INPATIENT:  Family/Significant Other Suicide Prevention Education  Suicide Prevention Education:  Contact Attempts: Pt's mother, Michele Rich , has been identified by the patient as the family member/significant other with whom the patient will be residing, and identified as the person(s) who will aid the patient in the event of a mental health crisis.  With written consent from the patient, two attempts were made to provide suicide prevention education, prior to and/or following the patient's discharge.  We were unsuccessful in providing suicide prevention education.  A suicide education pamphlet was given to the patient to share with family/significant other.  Date and time of first attempt: 01/05/2019 @ 1:15pm Date and time of second attempt:  Michele Rich 01/05/2019, 1:18 PM

## 2019-01-05 NOTE — Progress Notes (Signed)
   01/05/19 0310  COVID-19 Daily Checkoff  Have you had a fever (temp > 37.80C/100F)  in the past 24 hours?  No  COVID-19 EXPOSURE  Have you traveled outside the state in the past 14 days? No  Have you been in contact with someone with a confirmed diagnosis of COVID-19 or PUI in the past 14 days without wearing appropriate PPE? No  Have you been living in the same home as a person with confirmed diagnosis of COVID-19 or a PUI (household contact)? No  Have you been diagnosed with COVID-19? No

## 2019-03-18 ENCOUNTER — Emergency Department (HOSPITAL_COMMUNITY)
Admission: EM | Admit: 2019-03-18 | Discharge: 2019-03-18 | Disposition: A | Discharge status: Home / Self Care | Payer: Self-pay | Attending: Emergency Medicine | Admitting: Emergency Medicine

## 2019-03-18 ENCOUNTER — Encounter (HOSPITAL_COMMUNITY): Payer: Self-pay | Admitting: *Deleted

## 2019-03-18 DIAGNOSIS — B9689 Other specified bacterial agents as the cause of diseases classified elsewhere: Secondary | ICD-10-CM

## 2019-03-18 DIAGNOSIS — F17200 Nicotine dependence, unspecified, uncomplicated: Secondary | ICD-10-CM | POA: Insufficient documentation

## 2019-03-18 DIAGNOSIS — N760 Acute vaginitis: Secondary | ICD-10-CM | POA: Insufficient documentation

## 2019-03-18 DIAGNOSIS — N898 Other specified noninflammatory disorders of vagina: Secondary | ICD-10-CM

## 2019-03-18 LAB — URINALYSIS, ROUTINE W REFLEX MICROSCOPIC
Bacteria, UA: NONE SEEN
Bilirubin Urine: NEGATIVE
Glucose, UA: NEGATIVE mg/dL
Hgb urine dipstick: NEGATIVE
Ketones, ur: 20 mg/dL — AB
Nitrite: NEGATIVE
Protein, ur: 30 mg/dL — AB
Specific Gravity, Urine: 1.029 (ref 1.005–1.030)
pH: 7 (ref 5.0–8.0)

## 2019-03-18 LAB — I-STAT BETA HCG BLOOD, ED (MC, WL, AP ONLY): I-stat hCG, quantitative: <5 m[IU]/mL (ref <5)

## 2019-03-18 LAB — WET PREP, GENITAL
Sperm: NONE SEEN
Trich, Wet Prep: NONE SEEN
Yeast Wet Prep HPF POC: NONE SEEN

## 2019-03-18 MED ORDER — METRONIDAZOLE 500 MG PO TABS: 500.0000 mg | ORAL_TABLET | Freq: Two times a day (BID) | ORAL | 0 refills | Status: DC

## 2019-03-18 NOTE — ED Triage Notes (Addendum)
To ED for eval of vaginal itching for past 2 days. Pt denies vaginal discharge or abd pain. Has not been on abx. States she is uncertain of her last menses. States she stopped drinking ETOH approx a week ago - no etoh for 5-7 days.

## 2019-03-18 NOTE — ED Notes (Signed)
Sent down 3golds 1 blue 1 lav and one light green is case of  extra labs ordered

## 2019-03-18 NOTE — Discharge Instructions (Addendum)
You were seen in the ED today for vaginal itching You tested positive for bacterial vaginosis Please take antibiotic as prescribed for the next week. It is very important to not drink any alcohol on this medication.  You will receive a call in 2-3 days if you test positive for gonorrhea or chlamydia. You will not get a call if it is negative.  We have sent your urine for culture. You will also receive a call in 2-3 days if you need antibiotics for a UTI.  Please follow up with your PCP. If you do not have one you can follow up with [facility].

## 2019-03-18 NOTE — ED Provider Notes (Signed)
[facility] EM[facility]ENC[facility] Provider Note   CSN: [id number] Arrival date & time: 03/18/19  1107    History   Chief Complaint Chief Complaint  Patient presents with  . Vaginal Itching    HPI Alan Mulderatiania M Pietrzyk is a 33 y.o. female who presents to the ED today complaining of gradual onset, constant, vaginal itching x 2 days. No vaginal discharge or pelvic pain. She has not taken anything for her symptoms. Reports she has been detoxing from alcohol by herself for the past week and had some nausea earlier in the week that has since resolved. Denies fever, chills, abdominal pain, diarrhea, urinary sx, or any other associated sx. Pt is currently sexually active with 1 female partner. Reports her LNMP was 2 months ago although denies the chance of pregnancy. Pt is not on birth control and does not use condoms with partner.        Past Medical History:  Diagnosis Date  . HPV in female   . Medical history non-contributory     Patient Active Problem List   Diagnosis Date Noted  . MDD (major depressive disorder) 01/04/2019  . Alcohol dependence with alcohol-induced mood disorder (HCC) 01/04/2019    Past Surgical History:  Procedure Laterality Date  . NO PAST SURGERIES       OB History    Gravida  3   Para  2   Term  2   Preterm      AB  1   Living  2     SAB      TAB      Ectopic      Multiple      Live Births               Home Medications    Prior to Admission medications   Medication Sig Start Date End Date Taking? Authorizing Provider  folic acid (FOLVITE) 1 MG tablet Take 1 tablet (1 mg total) by mouth daily. 01/06/19   Aldean BakerSykes, Janet E, NP  hydrOXYzine (ATARAX/VISTARIL) 25 MG tablet Take 1 tablet (25 mg total) by mouth every 6 (six) hours as needed for itching or anxiety. 01/05/19   Aldean BakerSykes, Janet E, NP  metroNIDAZOLE (FLAGYL) 500 MG tablet Take 1 tablet (500 mg total) by mouth 2 (two) times daily. 03/18/19   Tanda RockersVenter, Demari Gales, PA-C   nicotine polacrilex (NICORETTE) 2 MG gum Take 1 each (2 mg total) by mouth as needed for smoking cessation. 01/05/19   Aldean BakerSykes, Janet E, NP  thiamine 100 MG tablet Take 1 tablet (100 mg total) by mouth daily. 01/06/19   Aldean BakerSykes, Janet E, NP  traZODone (DESYREL) 50 MG tablet Take 1 tablet (50 mg total) by mouth at bedtime as needed for sleep. 01/05/19   Aldean BakerSykes, Janet E, NP    Family History No family history on file.  Social History Social History   Tobacco Use  . Smoking status: Current Every Day Smoker    Packs/day: 0.25  . Smokeless tobacco: Never Used  Substance Use Topics  . Alcohol use: Yes    Comment: daily  . Drug use: No     Allergies   Apple fruit extract   Review of Systems Review of Systems  Constitutional: Negative for chills and fever.  HENT: Negative for congestion.   Respiratory: Negative for shortness of breath.   Cardiovascular: Negative for chest pain.  Gastrointestinal: Positive for nausea (resolved). Negative for abdominal pain and diarrhea.  Genitourinary: Positive for menstrual problem. Negative  for dysuria, frequency, pelvic pain, vaginal bleeding and vaginal discharge.       + vaginal itching  Musculoskeletal: Negative for myalgias.  Skin: Negative for rash.  Neurological: Negative for headaches.     Physical Exam Updated Vital Signs BP (!) 144/100 (BP Location: Right Arm)   Pulse 93   Temp 98.4 F (36.9 C) (Oral)   Resp 16   SpO2 100%   Physical Exam Vitals signs and nursing note reviewed.  Constitutional:      Appearance: She is not ill-appearing.  HENT:     Head: Normocephalic and atraumatic.  Eyes:     Conjunctiva/sclera: Conjunctivae normal.  Neck:     Musculoskeletal: Neck supple.  Cardiovascular:     Rate and Rhythm: Normal rate and regular rhythm.     Pulses: Normal pulses.  Pulmonary:     Effort: Pulmonary effort is normal.     Breath sounds: Normal breath sounds. No stridor. No wheezing or rales.  Abdominal:     Palpations:  Abdomen is soft.     Tenderness: There is no abdominal tenderness. There is no guarding or rebound.  Genitourinary:    Comments: Chaperone present for exam. No rashes, lesions, or tenderness to external genitalia. No erythema, injury, or tenderness to vaginal mucosa. Small amount of thick white vaginal discharge appreciated in vaginal vault. No adnexal masses, tenderness, or fullness. No CMT, cervical friability, or discharge from cervical os. Cervical os is closed. Uterus non-deviated, mobile, nonTTP, and without enlargement.   Skin:    General: Skin is warm and dry.  Neurological:     Mental Status: She is alert.      ED Treatments / Results  Labs (all labs ordered are listed, but only abnormal results are displayed) Labs Reviewed  WET PREP, GENITAL - Abnormal; Notable for the following components:      Result Value   Clue Cells Wet Prep HPF POC PRESENT (*)    WBC, Wet Prep HPF POC FEW (*)    All other components within normal limits  URINALYSIS, ROUTINE W REFLEX MICROSCOPIC - Abnormal; Notable for the following components:   Color, Urine AMBER (*)    Ketones, ur 20 (*)    Protein, ur 30 (*)    Leukocytes,Ua TRACE (*)    All other components within normal limits  URINE CULTURE  I-STAT BETA HCG BLOOD, ED ([facility], [facility], [facility] ONLY)  GC/CHLAMYDIA PROBE AMP (Box) NOT AT [facility]    EKG None  Radiology No results found.  Procedures Procedures (including critical care time)  Medications Ordered in ED Medications - No data to display   Initial Impression / Assessment and Plan / ED Course  I have reviewed the triage vital signs and the nursing notes.  Pertinent labs & imaging results that were available during my care of the patient were reviewed by me and considered in my medical decision making (see chart for details).    33 year old female presenting to the ED with vaginal itching without discharge or pelvic pain. She is sexually active with 1 female partner; not concerned  about STDs. Would like to be tested for GC/chlamydia given it is part of the pelvic exam. Declines RPR and HIV testing today. Pt without fever, chills, urinary symptoms, abdominal pain. She is also nontender to abdomen with palpation. Do not feel she needs additional  bloodwork or imaging today. Will perform pelvic exam and get urine preg/urinalysis and reevaluate.   Pt recently stopped drinking as she  is trying to self detox. She is non tachy on exam. No diaphoresis. Had nausea earlier in the week that has since resolved. She is currently in counseling and reports that if she starts having symptoms including palpitations, hallucinations, etc she will check into [facility].   Wet prep positive for BV. U/A with leuks and 6-10 WBCs per HPF although pt again denies urinary sx. Will send for culture at this time and hold on treatment. Advised pt to take flagyl for the next week for BV; discussed importance of not drinking alcohol with it. She is in agreement with plan and does not plan to return to drinking. She is very motivated to continue with her detox. Pt to follow up with PCP; referral given for [facility]. Stable for discharge home at this time.        Final Clinical Impressions(s) / ED Diagnoses   Final diagnoses:  BV (bacterial vaginosis)  Vaginal itching    ED Discharge Orders         Ordered    metroNIDAZOLE (FLAGYL) 500 MG tablet  2 times daily     03/18/19 1338           Eustaquio Maize, PA-C 03/18/19 1558    Gareth Morgan, MD 03/19/19 1026

## 2019-03-19 LAB — URINE CULTURE: Culture: 20000 — AB

## 2019-03-20 ENCOUNTER — Telehealth: Payer: Self-pay | Admitting: Emergency Medicine

## 2019-03-20 LAB — GC/CHLAMYDIA PROBE AMP (~~LOC~~) NOT AT ARMC
Chlamydia: NEGATIVE
Neisseria Gonorrhea: NEGATIVE

## 2019-03-20 NOTE — Telephone Encounter (Signed)
Post ED Visit - Positive Culture Follow-up  Culture report reviewed by antimicrobial stewardship pharmacist: [place] Team []  Elenor Quinones, Pharm.D. []  Heide Guile, Pharm.D., BCPS AQ-ID []  Parks Neptune, Pharm.D., BCPS []  Alycia Rossetti, Pharm.D., BCPS []  [place], Pharm.D., BCPS, AAHIVP []  Legrand Como, Pharm.D., BCPS, AAHIVP []  Salome Arnt, PharmD, BCPS []  Johnnette Gourd, PharmD, BCPS []  Hughes Better, PharmD, BCPS []  Leeroy Cha, PharmD []  Laqueta Linden, PharmD, BCPS []  Albertina Parr, PharmD  [place] Team []  Leodis Sias, PharmD []  Lindell Spar, PharmD []  Royetta Asal, PharmD []  Graylin Shiver, Rph []  Rema Fendt) Glennon Mac, PharmD []  Arlyn Dunning, PharmD []  Netta Cedars, PharmD []  Dia Sitter, PharmD []  Leone Haven, PharmD []  Gretta Arab, PharmD []  Theodis Shove, PharmD []  Peggyann Juba, PharmD []  Reuel Boom, PharmD   Positive urine culture Treated with metronidazole, organism sensitive to the same and no further patient follow-up is required at this time.  Hazle Nordmann 03/20/2019, 9:44 AM

## 2019-06-21 ENCOUNTER — Emergency Department (HOSPITAL_COMMUNITY): Payer: Self-pay

## 2019-06-21 ENCOUNTER — Other Ambulatory Visit: Payer: Self-pay

## 2019-06-21 ENCOUNTER — Encounter (HOSPITAL_COMMUNITY): Payer: Self-pay | Admitting: Emergency Medicine

## 2019-06-21 ENCOUNTER — Emergency Department (HOSPITAL_COMMUNITY)
Admission: EM | Admit: 2019-06-21 | Discharge: 2019-06-21 | Disposition: A | Discharge status: Home / Self Care | Payer: Self-pay | Attending: Emergency Medicine | Admitting: Emergency Medicine

## 2019-06-21 DIAGNOSIS — F17210 Nicotine dependence, cigarettes, uncomplicated: Secondary | ICD-10-CM | POA: Insufficient documentation

## 2019-06-21 DIAGNOSIS — F10930 Alcohol use, unspecified with withdrawal, uncomplicated: Secondary | ICD-10-CM

## 2019-06-21 DIAGNOSIS — R0789 Other chest pain: Secondary | ICD-10-CM | POA: Insufficient documentation

## 2019-06-21 DIAGNOSIS — Z79899 Other long term (current) drug therapy: Secondary | ICD-10-CM | POA: Insufficient documentation

## 2019-06-21 DIAGNOSIS — R079 Chest pain, unspecified: Secondary | ICD-10-CM

## 2019-06-21 DIAGNOSIS — I951 Orthostatic hypotension: Secondary | ICD-10-CM | POA: Insufficient documentation

## 2019-06-21 DIAGNOSIS — F10230 Alcohol dependence with withdrawal, uncomplicated: Secondary | ICD-10-CM | POA: Insufficient documentation

## 2019-06-21 LAB — CBC WITH DIFFERENTIAL/PLATELET
Abs Immature Granulocytes: 0.01 10*3/uL (ref 0.00–0.07)
Basophils Absolute: 0 10*3/uL (ref 0.0–0.1)
Basophils Relative: 1 %
Eosinophils Absolute: 0.1 10*3/uL (ref 0.0–0.5)
Eosinophils Relative: 2 %
HCT: 40.5 % (ref 36.0–46.0)
Hemoglobin: 14 g/dL (ref 12.0–15.0)
Immature Granulocytes: 0 %
Lymphocytes Relative: 34 %
Lymphs Abs: 1.8 10*3/uL (ref 0.7–4.0)
MCH: 33.2 pg (ref 26.0–34.0)
MCHC: 34.6 g/dL (ref 30.0–36.0)
MCV: 96 fL (ref 80.0–100.0)
Monocytes Absolute: 0.4 10*3/uL (ref 0.1–1.0)
Monocytes Relative: 7 %
Neutro Abs: 3.1 10*3/uL (ref 1.7–7.7)
Neutrophils Relative %: 56 %
Platelets: 95 10*3/uL — ABNORMAL LOW (ref 150–400)
RBC: 4.22 MIL/uL (ref 3.87–5.11)
RDW: 14.4 % (ref 11.5–15.5)
WBC: 5.4 10*3/uL (ref 4.0–10.5)
nRBC: 0 % (ref 0.0–0.2)

## 2019-06-21 LAB — LIPASE, BLOOD: Lipase: 46 U/L (ref 11–51)

## 2019-06-21 LAB — COMPREHENSIVE METABOLIC PANEL
ALT: 117 U/L — ABNORMAL HIGH (ref 0–44)
AST: 196 U/L — ABNORMAL HIGH (ref 15–41)
Albumin: 4.4 g/dL (ref 3.5–5.0)
Alkaline Phosphatase: 83 U/L (ref 38–126)
Anion gap: 15 (ref 5–15)
BUN: 6 mg/dL (ref 6–20)
CO2: 23 mmol/L (ref 22–32)
Calcium: 10.3 mg/dL (ref 8.9–10.3)
Chloride: 99 mmol/L (ref 98–111)
Creatinine, Ser: 0.87 mg/dL (ref 0.44–1.00)
GFR calc Af Amer: >60 mL/min (ref >60)
GFR calc non Af Amer: >60 mL/min (ref >60)
Glucose, Bld: 119 mg/dL — ABNORMAL HIGH (ref 70–99)
Potassium: 3 mmol/L — ABNORMAL LOW (ref 3.5–5.1)
Sodium: 137 mmol/L (ref 135–145)
Total Bilirubin: 1 mg/dL (ref 0.3–1.2)
Total Protein: 7.7 g/dL (ref 6.5–8.1)

## 2019-06-21 LAB — D-DIMER, QUANTITATIVE: D-Dimer, Quant: 0.9 ug/mL-FEU — ABNORMAL HIGH (ref 0.00–0.50)

## 2019-06-21 LAB — I-STAT BETA HCG BLOOD, ED (MC, WL, AP ONLY): I-stat hCG, quantitative: <5 m[IU]/mL (ref <5)

## 2019-06-21 LAB — TROPONIN I (HIGH SENSITIVITY)
Troponin I (High Sensitivity): <2 ng/L (ref <18)
Troponin I (High Sensitivity): <2 ng/L (ref <18)

## 2019-06-21 MED ORDER — CHLORDIAZEPOXIDE HCL 25 MG PO CAPS
50.0000 mg | ORAL_CAPSULE | Freq: Once | ORAL | Status: AC
  Administered 2019-06-21: 07:00:00 50 mg via ORAL
  Filled 2019-06-21: qty 2

## 2019-06-21 MED ORDER — LORAZEPAM 2 MG/ML IJ SOLN
1.0000 mg | Freq: Once | INTRAMUSCULAR | Status: AC
  Administered 2019-06-21: 1 mg via INTRAVENOUS
  Filled 2019-06-21: qty 1

## 2019-06-21 MED ORDER — SODIUM CHLORIDE 0.9 % IV BOLUS
1000.0000 mL | Freq: Once | INTRAVENOUS | Status: AC
  Administered 2019-06-21: 1000 mL via INTRAVENOUS

## 2019-06-21 MED ORDER — VITAMIN B-1 100 MG PO TABS
100.0000 mg | ORAL_TABLET | Freq: Every day | ORAL | Status: DC
  Administered 2019-06-21: 100 mg via ORAL
  Filled 2019-06-21: qty 1

## 2019-06-21 MED ORDER — CHLORDIAZEPOXIDE HCL 25 MG PO CAPS: ORAL_CAPSULE | ORAL | 0 refills | Status: DC

## 2019-06-21 MED ORDER — LORAZEPAM 1 MG PO TABS: 1.0000 mg | ORAL_TABLET | ORAL | Status: DC | PRN

## 2019-06-21 MED ORDER — POTASSIUM CHLORIDE CRYS ER 20 MEQ PO TBCR
40.0000 meq | EXTENDED_RELEASE_TABLET | Freq: Once | ORAL | Status: AC
  Administered 2019-06-21: 20 meq via ORAL
  Filled 2019-06-21: qty 2

## 2019-06-21 MED ORDER — ADULT MULTIVITAMIN W/MINERALS CH
1.0000 | ORAL_TABLET | Freq: Every day | ORAL | Status: DC
  Administered 2019-06-21: 1 via ORAL
  Filled 2019-06-21: qty 1

## 2019-06-21 MED ORDER — IOHEXOL 350 MG/ML SOLN
100.0000 mL | Freq: Once | INTRAVENOUS | Status: AC | PRN
  Administered 2019-06-21: 100 mL via INTRAVENOUS

## 2019-06-21 MED ORDER — LIDOCAINE VISCOUS HCL 2 % MT SOLN
15.0000 mL | Freq: Once | OROMUCOSAL | Status: AC
  Administered 2019-06-21: 15 mL via ORAL
  Filled 2019-06-21: qty 15

## 2019-06-21 MED ORDER — ONDANSETRON HCL 4 MG/2ML IJ SOLN
4.0000 mg | Freq: Once | INTRAMUSCULAR | Status: AC
  Administered 2019-06-21: 4 mg via INTRAVENOUS
  Filled 2019-06-21: qty 2

## 2019-06-21 MED ORDER — LORAZEPAM 2 MG/ML IJ SOLN: 1.0000 mg | INTRAMUSCULAR | Status: DC | PRN

## 2019-06-21 MED ORDER — FOLIC ACID 1 MG PO TABS
1.0000 mg | ORAL_TABLET | Freq: Every day | ORAL | Status: DC
  Administered 2019-06-21: 1 mg via ORAL
  Filled 2019-06-21: qty 1

## 2019-06-21 MED ORDER — ALUM & MAG HYDROXIDE-SIMETH 200-200-20 MG/5ML PO SUSP
30.0000 mL | Freq: Once | ORAL | Status: AC
  Administered 2019-06-21: 30 mL via ORAL
  Filled 2019-06-21: qty 30

## 2019-06-21 MED ORDER — LORAZEPAM 2 MG/ML IJ SOLN: 0.0000 mg | Freq: Four times a day (QID) | INTRAMUSCULAR | Status: DC

## 2019-06-21 MED ORDER — THIAMINE HCL 100 MG/ML IJ SOLN: 100.0000 mg | Freq: Every day | INTRAMUSCULAR | Status: DC

## 2019-06-21 MED ORDER — LORAZEPAM 2 MG/ML IJ SOLN: 0.0000 mg | Freq: Two times a day (BID) | INTRAMUSCULAR | Status: DC

## 2019-06-21 NOTE — BH Assessment (Signed)
Tele Assessment Note   Patient Name: Michele Rich MRN: [id number] Referring Physician: Dr. Manus Gunning Location of Patient: [facility] Location of Provider: Behavioral Health TTS Department  [name] is an 33 y.o. female. Pt denies SI/HI. Pt reports heavy drinking daily. Pt states she does not know how much she drinks. Pt states "I drink a lot." Pt reports withdrawal symptoms. Per Pt she experiences the following when she stops drinking: nausea, dirrehea, chills, and hallucinations. Pt denies other drug use. Per Pt she does not want inpatient treatment. Pt would like outpatient treatment.  Rashon, NP recommends D/C with resources.  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 HPV in female   . Medical history non-contributory     Past Surgical History:  Procedure Laterality Date  . NO PAST SURGERIES      Family History: No family history on file.  Social History:  reports that she has been smoking. She has been smoking about 0.25 packs per day. She has never used smokeless tobacco. She reports current alcohol use. She reports that she does not use drugs.  Additional Social History:  Alcohol / Drug Use Pain Medications: please see mar Prescriptions: please see mar Over the Counter: please see mar History of alcohol / drug use?: Yes Withdrawal Symptoms: Agitation, Weakness, Fever / Chills, Irritability, Nausea / Vomiting, Sweats, Diarrhea Substance #1 Name of Substance 1: alcohol 1 - Age of First Use: unknown 1 - Amount (size/oz): "drink all day" 1 - Frequency: "daily" 1 - Duration: ongoing 1 - Last Use / Amount: 3 days ago  CIWA: CIWA-Ar BP: 121/76 Pulse Rate: (!) 111 Nausea and Vomiting: no nausea and no vomiting Tactile Disturbances: none Tremor: no tremor Auditory Disturbances: not present Paroxysmal Sweats: no sweat visible Visual Disturbances: not present Anxiety: three Headache, Fullness in  Head: none present Agitation: normal activity Orientation and Clouding of Sensorium: oriented and can do serial additions CIWA-Ar Total: 3 COWS:    Allergies:  Allergies  Allergen Reactions  . Apple Fruit Extract Hives    Home Medications: (Not in a hospital admission)   OB/GYN Status:  No LMP recorded.  General Assessment Data Location of Assessment: [facility] ED TTS Assessment: In system Is this a Tele or Face-to-Face Assessment?: Tele Assessment Is this an Initial Assessment or a Re-assessment for this encounter?: Initial Assessment Patient Accompanied by:: N/A Language Other than English: No Living Arrangements: Other (Comment) What gender do you identify as?: Female Marital status: Single Maiden name: NA Pregnancy Status: No Living Arrangements: Parent Can pt return to current living arrangement?: Yes Admission Status: Voluntary Is patient capable of signing voluntary admission?: Yes Referral Source: Self/Family/Friend Insurance type: SP     Crisis Care Plan Living Arrangements: Parent Legal Guardian: Other:(self) Name of Psychiatrist: NA Name of Therapist: NA  Education Status Is patient currently in school?: No Is the patient employed, unemployed or receiving disability?: Employed  Risk to self with the past 6 months Suicidal Ideation: No Has patient been a risk to self within the past 6 months prior to admission? : No Suicidal Intent: No Has patient had any suicidal intent within the past 6 months prior to admission? : No Is patient at risk for suicide?: No Suicidal Plan?: No Has patient had any suicidal plan within the past 6 months prior to admission? : No Access to Means: No What has been your use of drugs/alcohol within the last 12 months?: NA Previous  Attempts/Gestures: No How many times?: 0 Other Self Harm Risks: NA Triggers for Past Attempts: None known Intentional Self Injurious Behavior: None Family Suicide History: No Recent stressful life  event(s): Other (Comment) Persecutory voices/beliefs?: No Depression: Yes Depression Symptoms: Isolating, Fatigue Substance abuse history and/or treatment for substance abuse?: No Suicide prevention information given to non-admitted patients: Not applicable  Risk to Others within the past 6 months Homicidal Ideation: No Does patient have any lifetime risk of violence toward others beyond the six months prior to admission? : No Thoughts of Harm to Others: No Current Homicidal Intent: No Current Homicidal Plan: No Access to Homicidal Means: No Identified Victim: NA History of harm to others?: No Assessment of Violence: None Noted Violent Behavior Description: NA Does patient have access to weapons?: No Criminal Charges Pending?: No Does patient have a court date: No Is patient on probation?: No  Psychosis Hallucinations: None noted Delusions: None noted  Mental Status Report Appearance/Hygiene: Unremarkable Eye Contact: Fair Motor Activity: Freedom of movement Speech: Logical/coherent Level of Consciousness: Alert Mood: Euthymic Affect: Appropriate to circumstance Anxiety Level: Minimal Thought Processes: Coherent, Relevant Judgement: Unimpaired Orientation: Place, Person, Time, Situation Obsessive Compulsive Thoughts/Behaviors: None  Cognitive Functioning Concentration: Normal Memory: Recent Intact, Remote Intact Is patient IDD: No Insight: Fair Impulse Control: Fair Appetite: Fair Have you had any weight changes? : No Change Sleep: No Change Total Hours of Sleep: 8 Vegetative Symptoms: None  ADLScreening [facility] Assessment Services) Patient's cognitive ability adequate to safely complete daily activities?: Yes Patient able to express need for assistance with ADLs?: Yes Independently performs ADLs?: Yes (appropriate for developmental age)  Prior Inpatient Therapy Prior Inpatient Therapy: No  Prior Outpatient Therapy Prior Outpatient Therapy: No Does patient  have an ACCT team?: No Does patient have Intensive In-House Services?  : No Does patient have [facility] services? : No Does patient have P4CC services?: No  ADL Screening (condition at time of admission) Patient's cognitive ability adequate to safely complete daily activities?: Yes Is the patient deaf or have difficulty hearing?: No Does the patient have difficulty seeing, even when wearing glasses/contacts?: No Does the patient have difficulty concentrating, remembering, or making decisions?: No Patient able to express need for assistance with ADLs?: Yes Does the patient have difficulty dressing or bathing?: No Independently performs ADLs?: Yes (appropriate for developmental age)       Abuse/Neglect Assessment (Assessment to be complete while patient is alone) Abuse/Neglect Assessment Can Be Completed: Yes Physical Abuse: Denies Verbal Abuse: Denies Sexual Abuse: Denies Exploitation of patient/patient's resources: Denies     Regulatory affairs officer (For Healthcare) Does Patient Have a Medical Advance Directive?: No Would patient like information on creating a medical advance directive?: No - Patient declined          Disposition:  Disposition Initial Assessment Completed for this Encounter: Yes  This service was provided via telemedicine using a 2-way, interactive audio and video technology.  Names of all persons participating in this telemedicine service and their role in this encounter. Name: Michele Rich Role: NP  Name:  Role:   Name:  Role:   Name:  Role:     Cyndia Bent 06/21/2019 8:58 AM

## 2019-06-21 NOTE — ED Notes (Signed)
Pt discharge instructions and prescriptions reviewed with the patient. Pt verbalized understanding of both. Pt discharged. 

## 2019-06-21 NOTE — ED Notes (Signed)
Patient transported to X-ray 

## 2019-06-21 NOTE — ED Provider Notes (Signed)
[facility] EMERGENCY DEPARTMENT Provider Note  CSN: [id number] Arrival date & time: 06/21/19 0430  Chief Complaint(s) Alcohol withdrawal  HPI Michele Rich is a 33 y.o. female with a history of alcohol abuse who presents to the emergency department with chest pain and tachycardia.  She reports that she has been detoxing from alcohol and her last drink was 3 days ago.  Since then she has been having nightmares waking up in the middle of the night.  Tonight she had a similar episode but was associated with substernal chest pain that is nonradiating and nonexertional.  No associated shortness of breath.  Patient did report several days of nausea and nonbloody nonbilious emesis, but reports she was able to keep food and fluids down yesterday.  No abdominal pain or diarrhea.  No urinary symptoms.  No other physical complaints.  HPI  Past Medical History Past Medical History:  Diagnosis Date  . HPV in female   . Medical history non-contributory    Patient Active Problem List   Diagnosis Date Noted  . MDD (major depressive disorder) 01/04/2019  . Alcohol dependence with alcohol-induced mood disorder (HCC) 01/04/2019   Home Medication(s) Prior to Admission medications   Medication Sig Start Date End Date Taking? Authorizing Provider  chlordiazePOXIDE (LIBRIUM) 25 MG capsule  PO TID x 1D, then 25-50mg  PO BID X 1D, then 25-50mg  PO QD X 1D 06/21/19   Bria Sparr, Amadeo Garnet, MD  folic acid (FOLVITE) 1 MG tablet Take 1 tablet (1 mg total) by mouth daily. 01/06/19   Aldean Baker, NP  hydrOXYzine (ATARAX/VISTARIL) 25 MG tablet Take 1 tablet (25 mg total) by mouth every 6 (six) hours as needed for itching or anxiety. 01/05/19   Aldean Baker, NP  metroNIDAZOLE (FLAGYL) 500 MG tablet Take 1 tablet (500 mg total) by mouth 2 (two) times daily. 03/18/19   Tanda Rockers, PA-C  nicotine polacrilex (NICORETTE) 2 MG gum Take 1 each (2 mg total) by mouth as needed for smoking  cessation. 01/05/19   Aldean Baker, NP  thiamine 100 MG tablet Take 1 tablet (100 mg total) by mouth daily. 01/06/19   Aldean Baker, NP  traZODone (DESYREL) 50 MG tablet Take 1 tablet (50 mg total) by mouth at bedtime as needed for sleep. 01/05/19   Aldean Baker, NP                                                                                                                                    Past Surgical History Past Surgical History:  Procedure Laterality Date  . NO PAST SURGERIES     Family History No family history on file.  Social History Social History   Tobacco Use  . Smoking status: Current Every Day Smoker    Packs/day: 0.25  . Smokeless tobacco: Never Used  Substance Use Topics  . Alcohol use: Yes    Comment:  daily  . Drug use: No   Allergies Apple fruit extract  Review of Systems Review of Systems All other systems are reviewed and are negative for acute change except as noted in the HPI  Physical Exam Vital Signs  I have reviewed the triage vital signs BP 112/80   Pulse 98   Temp 98.2 F (36.8 C) (Oral)   Resp 20   SpO2 100%   Physical Exam Vitals signs reviewed.  Constitutional:      General: She is not in acute distress.    Appearance: She is well-developed. She is not diaphoretic.  HENT:     Head: Normocephalic and atraumatic.     Nose: Nose normal.  Eyes:     General: No scleral icterus.       Right eye: No discharge.        Left eye: No discharge.     Conjunctiva/sclera: Conjunctivae normal.     Pupils: Pupils are equal, round, and reactive to light.  Neck:     Musculoskeletal: Normal range of motion and neck supple.  Cardiovascular:     Rate and Rhythm: Normal rate and regular rhythm.     Heart sounds: No murmur. No friction rub. No gallop.   Pulmonary:     Effort: Pulmonary effort is normal. No respiratory distress.     Breath sounds: Normal breath sounds. No stridor. No rales.  Abdominal:     General: There is no distension.      Palpations: Abdomen is soft.     Tenderness: There is no abdominal tenderness.  Musculoskeletal:        General: No tenderness.  Skin:    General: Skin is warm and dry.     Findings: No erythema or rash.  Neurological:     Mental Status: She is alert and oriented to person, place, and time.     ED Results and Treatments Labs (all labs ordered are listed, but only abnormal results are displayed) Labs Reviewed  CBC WITH DIFFERENTIAL/PLATELET - Abnormal; Notable for the following components:      Result Value   Platelets 95 (*)    All other components within normal limits  COMPREHENSIVE METABOLIC PANEL - Abnormal; Notable for the following components:   Potassium 3.0 (*)    Glucose, Bld 119 (*)    AST 196 (*)    ALT 117 (*)    All other components within normal limits  LIPASE, BLOOD  I-STAT BETA HCG BLOOD, ED ([facility], [facility], [facility] ONLY)                                                                                                                         EKG  EKG Interpretation  Date/Time:  Thursday June 21 2019 04:41:29 EST Ventricular Rate:  133 PR Interval:  68 QRS Duration: 82 QT Interval:  378 QTC Calculation: 562 R Axis:   83 Text Interpretation: ** Critical Test Result: Long QTc Sinus tachycardia with short PR  Nonspecific ST and T wave abnormality Abnormal ECG Confirmed by Drema Pryardama, Legend Pecore [phone]([id number]) on 06/21/2019 5:42:13 AM      Radiology Cxr 2 View - Chest Pain  Result Date: 06/21/2019 CLINICAL DATA:  Chest pain and nightmares. EXAM: CHEST - 2 VIEW COMPARISON:  05/04/2018 FINDINGS: The heart size and mediastinal contours are within normal limits. Both lungs are clear. The visualized skeletal structures are unremarkable. IMPRESSION: No active cardiopulmonary disease. Electronically Signed   By: Katherine Mantlehristopher  Green M.D.   On: 06/21/2019 06:12    Pertinent labs & imaging results that were available during my care of the patient were reviewed by me and considered in my medical  decision making (see chart for details).  Medications Ordered in ED Medications  alum & mag hydroxide-simeth (MAALOX/MYLANTA) 200-200-20 MG/5ML suspension 30 mL (30 mLs Oral Given 06/21/19 0552)    And  lidocaine (XYLOCAINE) 2 % viscous mouth solution 15 mL (15 mLs Oral Given 06/21/19 0553)  sodium chloride 0.9 % bolus 1,000 mL (0 mLs Intravenous Stopped 06/21/19 0701)  ondansetron (ZOFRAN) injection 4 mg (4 mg Intravenous Given 06/21/19 0550)  potassium chloride SA (KLOR-CON) CR tablet 40 mEq (20 mEq Oral Given 06/21/19 0700)  chlordiazePOXIDE (LIBRIUM) capsule 50 mg (50 mg Oral Given 06/21/19 0729)                                                                                                                                    Procedures Procedures  (including critical care time)  Medical Decision Making / ED Course I have reviewed the nursing notes for this encounter and the patient's prior records (if available in EHR or on provided paperwork).   Alan Mulderatiania M Olaes was evaluated in Emergency Department on 06/21/2019 for the symptoms described in the history of present illness. She was evaluated in the context of the global COVID-19 pandemic, which necessitated consideration that the patient might be at risk for infection with the SARS-CoV-2 virus that causes COVID-19. Institutional protocols and algorithms that pertain to the evaluation of patients at risk for COVID-19 are in a state of rapid change based on information released by regulatory bodies including the CDC and federal and state organizations. These policies and algorithms were followed during the patient's care in the ED.  Substernal chest pain.  EKG without acute ischemic changes.  Low suspicion for ACS.  Doubt pulmonary embolism.  Doubt dissection.  Chest x-ray without evidence of pneumomediastinum concerning for perforation.  Likely GI related given recent emesis.  Pain completely resolved with GI cocktail.  Labs reassuring without  leukocytosis or significant anemia.  No significant electrolyte derangements other than mild hypokalemia which was repleted orally.  No renal insufficiency.  Mildly elevated transaminase.   Patient provided with IV fluids.  Orthostatics are positive due to heart rate.  Patient goes from rates of low 100s to rates of 150s upon standing.  Will require additional fluids.  Patient was tremulous.  Might be going through withdrawal symptoms.  I believe she is appropriate for Librium taper.  Provided first dose in the emergency department.  Plan to reassess following second IV fluid bolus and Librium.  If she improves, she should be stable for outpatient management.       Final Clinical Impression(s) / ED Diagnoses Final diagnoses:  Chest pain  Orthostasis  Alcohol withdrawal syndrome without complication ([place])      This chart was dictated using voice recognition software.  Despite best efforts to proofread,  errors can occur which can change the documentation meaning.   Fatima Blank, MD 06/21/19 [phone]

## 2019-06-21 NOTE — ED Provider Notes (Signed)
Care assumed from Dr. Leonette [facility].  Patient with alcohol abuse with no drink in the past 3 days here with alcohol withdrawal.  This is been having nightmares waking her up in the middle of the night but no hallucinations. No suicidal or homicidal thoughts.  Patient awaiting completion of her IV fluids her persistent tachycardia.  CIWA score was 3   Tachycardia has resolved.  Latest CIWA score is 0.  Patient denies any tremors.  She is given outpatient resources and does not meet inpatient criteria for detox. Troponin is negative and CT PE is negative.  No evidence of ACS or PE.  She will be discharged with Librium taper per Dr. Wilma Flavin plan. Return precautions discussed.  BP 107/66   Pulse 91   Temp 98.2 F (36.8 C) (Oral)   Resp 18   SpO2 98%     Ezequiel Essex, MD 06/21/19 1308

## 2019-06-21 NOTE — Discharge Instructions (Addendum)
Take the Librium as prescribed.  Do not drink if you are taking his medication.  Follow up with the resources provided.  Return to the ED if you develop new or worsening symptoms.

## 2019-06-21 NOTE — ED Triage Notes (Signed)
Pt c/o chest pain and nightmares. Reports that she has been detoxing from ETOH. States she normally drinks 2 fifths/day. Last drink x 3 days ago. HR 150 in triage.

## 2019-08-15 ENCOUNTER — Other Ambulatory Visit: Payer: Self-pay

## 2019-08-15 ENCOUNTER — Emergency Department (HOSPITAL_COMMUNITY): Payer: Self-pay

## 2019-08-15 ENCOUNTER — Emergency Department (HOSPITAL_COMMUNITY)
Admission: EM | Admit: 2019-08-15 | Discharge: 2019-08-16 | Discharge status: Against Medical Advice | Payer: Self-pay | Attending: Emergency Medicine | Admitting: Emergency Medicine

## 2019-08-15 ENCOUNTER — Encounter (HOSPITAL_COMMUNITY): Payer: Self-pay

## 2019-08-15 DIAGNOSIS — Z5321 Procedure and treatment not carried out due to patient leaving prior to being seen by health care provider: Secondary | ICD-10-CM | POA: Insufficient documentation

## 2019-08-15 NOTE — ED Notes (Signed)
Pt called x6 for vitals. No reply for over 1hr. Not seen in lobby at this time.

## 2019-08-15 NOTE — ED Triage Notes (Signed)
Pt arrives POV for eval of shortness of breath acute onset 20 mins PTA. Pt denies hx of same, reports smokes 1 PPD. Pt denies CP, N/V, diaphoresis. NARD in triage, satting well on RA. No other complaints, no known sick contacts

## 2019-10-09 ENCOUNTER — Encounter (HOSPITAL_COMMUNITY): Payer: Self-pay | Admitting: Emergency Medicine

## 2019-10-09 ENCOUNTER — Other Ambulatory Visit: Payer: Self-pay

## 2019-10-09 ENCOUNTER — Emergency Department (HOSPITAL_COMMUNITY)
Admission: EM | Admit: 2019-10-09 | Discharge: 2019-10-10 | Disposition: A | Discharge status: Home / Self Care | Payer: Self-pay | Attending: Emergency Medicine | Admitting: Emergency Medicine

## 2019-10-09 DIAGNOSIS — Z5321 Procedure and treatment not carried out due to patient leaving prior to being seen by health care provider: Secondary | ICD-10-CM | POA: Insufficient documentation

## 2019-10-09 DIAGNOSIS — F1010 Alcohol abuse, uncomplicated: Secondary | ICD-10-CM | POA: Insufficient documentation

## 2019-10-09 LAB — COMPREHENSIVE METABOLIC PANEL
ALT: 34 U/L (ref 0–44)
AST: 51 U/L — ABNORMAL HIGH (ref 15–41)
Albumin: 4.3 g/dL (ref 3.5–5.0)
Alkaline Phosphatase: 86 U/L (ref 38–126)
Anion gap: 14 (ref 5–15)
BUN: <5 mg/dL — ABNORMAL LOW (ref 6–20)
CO2: 26 mmol/L (ref 22–32)
Calcium: 8.9 mg/dL (ref 8.9–10.3)
Chloride: 104 mmol/L (ref 98–111)
Creatinine, Ser: 0.7 mg/dL (ref 0.44–1.00)
GFR calc Af Amer: >60 mL/min (ref >60)
GFR calc non Af Amer: >60 mL/min (ref >60)
Glucose, Bld: 97 mg/dL (ref 70–99)
Potassium: 3.7 mmol/L (ref 3.5–5.1)
Sodium: 144 mmol/L (ref 135–145)
Total Bilirubin: 0.5 mg/dL (ref 0.3–1.2)
Total Protein: 7.8 g/dL (ref 6.5–8.1)

## 2019-10-09 LAB — SALICYLATE LEVEL: Salicylate Lvl: <7 mg/dL — ABNORMAL LOW (ref 7.0–30.0)

## 2019-10-09 LAB — CBC
HCT: 42.1 % (ref 36.0–46.0)
Hemoglobin: 14.1 g/dL (ref 12.0–15.0)
MCH: 32 pg (ref 26.0–34.0)
MCHC: 33.5 g/dL (ref 30.0–36.0)
MCV: 95.5 fL (ref 80.0–100.0)
Platelets: 260 10*3/uL (ref 150–400)
RBC: 4.41 MIL/uL (ref 3.87–5.11)
RDW: 14.6 % (ref 11.5–15.5)
WBC: 5.1 10*3/uL (ref 4.0–10.5)
nRBC: 0 % (ref 0.0–0.2)

## 2019-10-09 LAB — RAPID URINE DRUG SCREEN, HOSP PERFORMED
Amphetamines: NOT DETECTED
Barbiturates: NOT DETECTED
Benzodiazepines: NOT DETECTED
Cocaine: NOT DETECTED
Opiates: NOT DETECTED
Tetrahydrocannabinol: NOT DETECTED

## 2019-10-09 LAB — ETHANOL: Alcohol, Ethyl (B): 444 mg/dL (ref <10)

## 2019-10-09 LAB — ACETAMINOPHEN LEVEL: Acetaminophen (Tylenol), Serum: <10 ug/mL — ABNORMAL LOW (ref 10–30)

## 2019-10-09 LAB — I-STAT BETA HCG BLOOD, ED (MC, WL, AP ONLY): I-stat hCG, quantitative: <5 m[IU]/mL (ref <5)

## 2019-10-09 NOTE — ED Triage Notes (Signed)
Pt reports she needs help detoxing from alcohol.  Last drink was three days ago.  Drinks "a lot".  CIWA 0.

## 2019-10-09 NOTE — ED Notes (Signed)
Dr Freida Busman aware Ethanol 444. No new orders this time.

## 2019-10-10 NOTE — ED Notes (Signed)
Called patient x4 no answer °

## 2019-10-13 ENCOUNTER — Other Ambulatory Visit: Payer: Self-pay

## 2019-10-13 ENCOUNTER — Encounter (HOSPITAL_COMMUNITY): Payer: Self-pay | Admitting: Emergency Medicine

## 2019-10-13 ENCOUNTER — Emergency Department (HOSPITAL_COMMUNITY)
Admission: EM | Admit: 2019-10-13 | Discharge: 2019-10-14 | Disposition: A | Discharge status: Home / Self Care | Payer: Self-pay | Attending: Emergency Medicine | Admitting: Emergency Medicine

## 2019-10-13 DIAGNOSIS — F17200 Nicotine dependence, unspecified, uncomplicated: Secondary | ICD-10-CM | POA: Insufficient documentation

## 2019-10-13 DIAGNOSIS — F1020 Alcohol dependence, uncomplicated: Secondary | ICD-10-CM | POA: Insufficient documentation

## 2019-10-13 DIAGNOSIS — Z79899 Other long term (current) drug therapy: Secondary | ICD-10-CM | POA: Insufficient documentation

## 2019-10-13 HISTORY — DX: Alcohol dependence, uncomplicated: F10.20

## 2019-10-13 NOTE — ED Triage Notes (Signed)
Patient requesting detox for her alcoholism , last drink 2 days ago , denies HI or SI , no hallucinations .

## 2019-10-14 LAB — CBC
HCT: 40.9 % (ref 36.0–46.0)
Hemoglobin: 13.8 g/dL (ref 12.0–15.0)
MCH: 31.7 pg (ref 26.0–34.0)
MCHC: 33.7 g/dL (ref 30.0–36.0)
MCV: 94 fL (ref 80.0–100.0)
Platelets: 190 10*3/uL (ref 150–400)
RBC: 4.35 MIL/uL (ref 3.87–5.11)
RDW: 14.7 % (ref 11.5–15.5)
WBC: 5.8 10*3/uL (ref 4.0–10.5)
nRBC: 0 % (ref 0.0–0.2)

## 2019-10-14 LAB — RAPID URINE DRUG SCREEN, HOSP PERFORMED
Amphetamines: NOT DETECTED
Barbiturates: NOT DETECTED
Benzodiazepines: NOT DETECTED
Cocaine: NOT DETECTED
Opiates: NOT DETECTED
Tetrahydrocannabinol: NOT DETECTED

## 2019-10-14 LAB — COMPREHENSIVE METABOLIC PANEL
ALT: 69 U/L — ABNORMAL HIGH (ref 0–44)
AST: 139 U/L — ABNORMAL HIGH (ref 15–41)
Albumin: 4.4 g/dL (ref 3.5–5.0)
Alkaline Phosphatase: 86 U/L (ref 38–126)
Anion gap: 18 — ABNORMAL HIGH (ref 5–15)
BUN: 6 mg/dL (ref 6–20)
CO2: 23 mmol/L (ref 22–32)
Calcium: 9.2 mg/dL (ref 8.9–10.3)
Chloride: 98 mmol/L (ref 98–111)
Creatinine, Ser: 0.79 mg/dL (ref 0.44–1.00)
GFR calc Af Amer: >60 mL/min (ref >60)
GFR calc non Af Amer: >60 mL/min (ref >60)
Glucose, Bld: 122 mg/dL — ABNORMAL HIGH (ref 70–99)
Potassium: 3.7 mmol/L (ref 3.5–5.1)
Sodium: 139 mmol/L (ref 135–145)
Total Bilirubin: 0.7 mg/dL (ref 0.3–1.2)
Total Protein: 7.9 g/dL (ref 6.5–8.1)

## 2019-10-14 LAB — I-STAT BETA HCG BLOOD, ED (MC, WL, AP ONLY): I-stat hCG, quantitative: <5 m[IU]/mL (ref <5)

## 2019-10-14 LAB — ETHANOL: Alcohol, Ethyl (B): 332 mg/dL (ref <10)

## 2019-10-14 MED ORDER — CHLORDIAZEPOXIDE HCL 25 MG PO CAPS
25.0000 mg | ORAL_CAPSULE | Freq: Once | ORAL | Status: AC
  Administered 2019-10-14: 25 mg via ORAL
  Filled 2019-10-14: qty 1

## 2019-10-14 MED ORDER — CHLORDIAZEPOXIDE HCL 25 MG PO CAPS: ORAL_CAPSULE | ORAL | 0 refills | Status: DC

## 2019-10-14 NOTE — ED Provider Notes (Signed)
[facility] EMERGENCY DEPARTMENT Provider Note  CSN: [id number] Arrival date & time: 10/13/19 2320  Chief Complaint(s) Alcohol Problem  HPI Michele Rich is a 34 y.o. female with a history of alcoholism previously treated with Librium and had good success several months ago presents for alcohol intoxication requesting assistance with detox.  Patient reported that she relapsed several weeks ago.  Since then she has been drinking as she used to.  She reports that she last drank yesterday.  States that she feels tremulous.  Denies any fevers or chills.  No sweating.  No hallucinations.  No prior seizures.  Denies any recent infections.  Some nausea without emesis.  No diarrhea.  No abdominal pain.  No chest pain or shortness of breath.  No other physical complaints.  HPI  Past Medical History Past Medical History:  Diagnosis Date  . Alcoholism (HCC)   . HPV in female   . Medical history non-contributory    Patient Active Problem List   Diagnosis Date Noted  . MDD (major depressive disorder) 01/04/2019  . Alcohol dependence with alcohol-induced mood disorder (HCC) 01/04/2019   Home Medication(s) Prior to Admission medications   Medication Sig Start Date End Date Taking? Authorizing Provider  chlordiazePOXIDE (LIBRIUM) 25 MG capsule 50mg  PO TID x 1D, then 25-50mg  PO BID X 1D, then 25-50mg  PO QD X 1D 10/14/19   Marjarie Irion, 10/16/19, MD  folic acid (FOLVITE) 1 MG tablet Take 1 tablet (1 mg total) by mouth daily. 01/06/19   01/08/19, NP  hydrOXYzine (ATARAX/VISTARIL) 25 MG tablet Take 1 tablet (25 mg total) by mouth every 6 (six) hours as needed for itching or anxiety. 01/05/19   01/07/19, NP  metroNIDAZOLE (FLAGYL) 500 MG tablet Take 1 tablet (500 mg total) by mouth 2 (two) times daily. 03/18/19   05/18/19, PA-C  nicotine polacrilex (NICORETTE) 2 MG gum Take 1 each (2 mg total) by mouth as needed for smoking cessation. 01/05/19   01/07/19, NP    thiamine 100 MG tablet Take 1 tablet (100 mg total) by mouth daily. 01/06/19   01/08/19, NP  traZODone (DESYREL) 50 MG tablet Take 1 tablet (50 mg total) by mouth at bedtime as needed for sleep. 01/05/19   01/07/19, NP                                                                                                                                    Past Surgical History Past Surgical History:  Procedure Laterality Date  . NO PAST SURGERIES     Family History No family history on file.  Social History Social History   Tobacco Use  . Smoking status: Current Every Day Smoker    Packs/day: 0.25  . Smokeless tobacco: Never Used  Substance Use Topics  . Alcohol use: Yes    Comment: daily  . Drug use:  No   Allergies Apple fruit extract  Review of Systems Review of Systems All other systems are reviewed and are negative for acute change except as noted in the HPI  Physical Exam Vital Signs  I have reviewed the triage vital signs BP (!) 148/88 (BP Location: Right Arm)   Pulse 78   Temp 97.6 F (36.4 C) (Oral)   Resp 18   Ht 5\' 5"  (1.651 m)   Wt 60 kg   LMP 10/10/2019   SpO2 100%   BMI 22.01 kg/m   Physical Exam Vitals reviewed.  Constitutional:      General: She is not in acute distress.    Appearance: She is well-developed. She is not diaphoretic.  HENT:     Head: Normocephalic and atraumatic.     Nose: Nose normal.  Eyes:     General: No scleral icterus.       Right eye: No discharge.        Left eye: No discharge.     Conjunctiva/sclera: Conjunctivae normal.     Pupils: Pupils are equal, round, and reactive to light.  Cardiovascular:     Rate and Rhythm: Normal rate and regular rhythm.     Heart sounds: No murmur. No friction rub. No gallop.   Pulmonary:     Effort: Pulmonary effort is normal. No respiratory distress.     Breath sounds: Normal breath sounds. No stridor. No rales.  Abdominal:     General: There is no distension.     Palpations:  Abdomen is soft.     Tenderness: There is no abdominal tenderness.  Musculoskeletal:        General: No tenderness.     Cervical back: Normal range of motion and neck supple.  Skin:    General: Skin is warm and dry.     Findings: No erythema or rash.  Neurological:     Mental Status: She is alert and oriented to person, place, and time.     ED Results and Treatments Labs (all labs ordered are listed, but only abnormal results are displayed) Labs Reviewed  COMPREHENSIVE METABOLIC PANEL - Abnormal; Notable for the following components:      Result Value   Glucose, Bld 122 (*)    AST 139 (*)    ALT 69 (*)    Anion gap 18 (*)    All other components within normal limits  ETHANOL - Abnormal; Notable for the following components:   Alcohol, Ethyl (B) 332 (*)    All other components within normal limits  CBC  RAPID URINE DRUG SCREEN, HOSP PERFORMED  I-STAT BETA HCG BLOOD, ED ([facility], [facility], [facility] ONLY)                                                                                                                         EKG  EKG Interpretation  Date/Time:    Ventricular Rate:    PR Interval:    QRS Duration:  QT Interval:    QTC Calculation:   R Axis:     Text Interpretation:        Radiology No results found.  Pertinent labs & imaging results that were available during my care of the patient were reviewed by me and considered in my medical decision making (see chart for details).  Medications Ordered in ED Medications  chlordiazePOXIDE (LIBRIUM) capsule 25 mg (25 mg Oral Given 10/14/19 0244)                                                                                                                                    Procedures Procedures  (including critical care time)  Medical Decision Making / ED Course I have reviewed the nursing notes for this encounter and the patient's prior records (if available in EHR or on provided paperwork).   COLETTE DICAMILLO was  evaluated in Emergency Department on 10/14/2019 for the symptoms described in the history of present illness. She was evaluated in the context of the global COVID-19 pandemic, which necessitated consideration that the patient might be at risk for infection with the SARS-CoV-2 virus that causes COVID-19. Institutional protocols and algorithms that pertain to the evaluation of patients at risk for COVID-19 are in a state of rapid change based on information released by regulatory bodies including the CDC and federal and state organizations. These policies and algorithms were followed during the patient's care in the ED.  Patient presents with alcohol intoxication and tremors.  She does not appear to be in severe alcohol withdrawal.  She has stable vital signs.  Labs are grossly reassuring.  Alcohol level is greater than 300.  On record review, patient has not had any visits for alcohol intoxication this is being treated.  Feel that she would benefit from another trial of Librium.  She was given first dose here in the emergency department and prescribed it.      Final Clinical Impression(s) / ED Diagnoses Final diagnoses:  Alcoholism (HCC)   The patient appears reasonably screened and/or stabilized for discharge and I doubt any other medical condition or other [facility] requiring further screening, evaluation, or treatment in the ED at this time prior to discharge. Safe for discharge with strict return precautions.  Disposition: Discharge  Condition: Good  I have discussed the results, Dx and Tx plan with the patient/family who expressed understanding and agree(s) with the plan. Discharge instructions discussed at length. The patient/family was given strict return precautions who verbalized understanding of the instructions. No further questions at time of discharge.    ED Discharge Orders         Ordered    chlordiazePOXIDE (LIBRIUM) 25 MG capsule     10/14/19 0405           This chart was  dictated using voice recognition software.  Despite best efforts to proofread,  errors can occur which can change the documentation meaning.  Fatima Blank, MD 10/14/19 [phone]

## 2019-11-06 ENCOUNTER — Other Ambulatory Visit: Payer: Self-pay

## 2019-11-06 ENCOUNTER — Emergency Department (HOSPITAL_COMMUNITY)
Admission: EM | Admit: 2019-11-06 | Discharge: 2019-11-07 | Disposition: A | Discharge status: Home / Self Care | Payer: Self-pay | Attending: Emergency Medicine | Admitting: Emergency Medicine

## 2019-11-06 ENCOUNTER — Encounter (HOSPITAL_COMMUNITY): Payer: Self-pay

## 2019-11-06 DIAGNOSIS — F329 Major depressive disorder, single episode, unspecified: Secondary | ICD-10-CM | POA: Insufficient documentation

## 2019-11-06 DIAGNOSIS — Y908 Blood alcohol level of 240 mg/100 ml or more: Secondary | ICD-10-CM | POA: Insufficient documentation

## 2019-11-06 DIAGNOSIS — F17210 Nicotine dependence, cigarettes, uncomplicated: Secondary | ICD-10-CM | POA: Insufficient documentation

## 2019-11-06 DIAGNOSIS — R45851 Suicidal ideations: Secondary | ICD-10-CM | POA: Insufficient documentation

## 2019-11-06 DIAGNOSIS — Z79899 Other long term (current) drug therapy: Secondary | ICD-10-CM | POA: Insufficient documentation

## 2019-11-06 DIAGNOSIS — F10920 Alcohol use, unspecified with intoxication, uncomplicated: Secondary | ICD-10-CM

## 2019-11-06 DIAGNOSIS — F1024 Alcohol dependence with alcohol-induced mood disorder: Secondary | ICD-10-CM | POA: Diagnosis present

## 2019-11-06 LAB — CBC
HCT: 41 % (ref 36.0–46.0)
Hemoglobin: 13.6 g/dL (ref 12.0–15.0)
MCH: 32.9 pg (ref 26.0–34.0)
MCHC: 33.2 g/dL (ref 30.0–36.0)
MCV: 99.3 fL (ref 80.0–100.0)
Platelets: 212 10*3/uL (ref 150–400)
RBC: 4.13 MIL/uL (ref 3.87–5.11)
RDW: 15.6 % — ABNORMAL HIGH (ref 11.5–15.5)
WBC: 5.3 10*3/uL (ref 4.0–10.5)
nRBC: 0 % (ref 0.0–0.2)

## 2019-11-06 LAB — COMPREHENSIVE METABOLIC PANEL
ALT: 40 U/L (ref 0–44)
AST: 62 U/L — ABNORMAL HIGH (ref 15–41)
Albumin: 4.4 g/dL (ref 3.5–5.0)
Alkaline Phosphatase: 97 U/L (ref 38–126)
Anion gap: 15 (ref 5–15)
BUN: 10 mg/dL (ref 6–20)
CO2: 25 mmol/L (ref 22–32)
Calcium: 8.4 mg/dL — ABNORMAL LOW (ref 8.9–10.3)
Chloride: 101 mmol/L (ref 98–111)
Creatinine, Ser: 0.55 mg/dL (ref 0.44–1.00)
GFR calc Af Amer: >60 mL/min (ref >60)
GFR calc non Af Amer: >60 mL/min (ref >60)
Glucose, Bld: 99 mg/dL (ref 70–99)
Potassium: 4.1 mmol/L (ref 3.5–5.1)
Sodium: 141 mmol/L (ref 135–145)
Total Bilirubin: 0.4 mg/dL (ref 0.3–1.2)
Total Protein: 8.2 g/dL — ABNORMAL HIGH (ref 6.5–8.1)

## 2019-11-06 LAB — RAPID URINE DRUG SCREEN, HOSP PERFORMED
Amphetamines: NOT DETECTED
Barbiturates: NOT DETECTED
Benzodiazepines: POSITIVE — AB
Cocaine: NOT DETECTED
Opiates: NOT DETECTED
Tetrahydrocannabinol: NOT DETECTED

## 2019-11-06 LAB — ETHANOL: Alcohol, Ethyl (B): 409 mg/dL (ref <10)

## 2019-11-06 LAB — I-STAT BETA HCG BLOOD, ED (MC, WL, AP ONLY): I-stat hCG, quantitative: <5 m[IU]/mL (ref <5)

## 2019-11-06 LAB — ACETAMINOPHEN LEVEL: Acetaminophen (Tylenol), Serum: <10 ug/mL — ABNORMAL LOW (ref 10–30)

## 2019-11-06 LAB — SALICYLATE LEVEL: Salicylate Lvl: <7 mg/dL — ABNORMAL LOW (ref 7.0–30.0)

## 2019-11-06 MED ORDER — SODIUM CHLORIDE 0.9 % IV BOLUS
1000.0000 mL | Freq: Once | INTRAVENOUS | Status: AC
  Administered 2019-11-06: 23:00:00 1000 mL via INTRAVENOUS

## 2019-11-06 MED ORDER — ONDANSETRON 8 MG PO TBDP
8.0000 mg | ORAL_TABLET | Freq: Three times a day (TID) | ORAL | Status: DC | PRN
  Administered 2019-11-07 (×2): 8 mg via ORAL
  Filled 2019-11-06 (×2): qty 1

## 2019-11-06 NOTE — BHH Counselor (Signed)
Pt unable to participate at this time BAL 409 at 1805, TTS will assess after 4 hour period /posion control after 10pm

## 2019-11-06 NOTE — ED Notes (Signed)
Slitter at bedside. Pt belongings labeled in bag at in cabinet at nurses station

## 2019-11-06 NOTE — ED Provider Notes (Signed)
[place] DEPT Provider Note   CSN: [id number] Arrival date & time: 11/06/19  1626     History Chief Complaint  Patient presents with  . Suicidal  . Homicidal  . Alcohol Intoxication    Michele Rich is a 34 y.o. female.  34 y.o female with a  PMH of Alcoholism, MDD presents to the ED with a chief complaint of SI. Patient reports she is currently trying to detox from alcohol, reports has been drinking very little each day in order to avoid withdrawals.  She reports she began to have suicidal thoughts yesterday, has had delusional thoughts in the past.  She reports drinking 3 shots of vodka today, states she plans on "going to take a knife to my throat ".  According to EMS report, she is also threatening to hurt her mom, she denies this at this time.  Denies any pain such as chest pain, shortness of breath, abdominal pain no other complaints.  The history is provided by the patient.  Alcohol Intoxication This is a recurrent problem. Pertinent negatives include no chest pain, no abdominal pain, no headaches and no shortness of breath.       Past Medical History:  Diagnosis Date  . Alcoholism ([place])   . HPV in female   . Medical history non-contributory     Patient Active Problem List   Diagnosis Date Noted  . MDD (major depressive disorder) 01/04/2019  . Alcohol dependence with alcohol-induced mood disorder ([place]) 01/04/2019    Past Surgical History:  Procedure Laterality Date  . NO PAST SURGERIES       OB History    Gravida  3   Para  2   Term  2   Preterm      AB  1   Living  2     SAB      TAB      Ectopic      Multiple      Live Births              Family History  Family history unknown: Yes    Social History   Tobacco Use  . Smoking status: Current Every Day Smoker    Packs/day: 0.10    Types: Cigarettes  . Smokeless tobacco: Never Used  Substance Use Topics  . Alcohol use: Yes    Comment:  daily  . Drug use: No    Home Medications Prior to Admission medications   Medication Sig Start Date End Date Taking? Authorizing Provider  chlordiazePOXIDE (LIBRIUM) 25 MG capsule 50mg  PO TID x 1D, then 25-50mg  PO BID X 1D, then 25-50mg  PO QD X 1D 10/14/19   Cardama, Grayce Sessions, MD  folic acid (FOLVITE) 1 MG tablet Take 1 tablet (1 mg total) by mouth daily. 01/06/19   Connye Burkitt, NP  hydrOXYzine (ATARAX/VISTARIL) 25 MG tablet Take 1 tablet (25 mg total) by mouth every 6 (six) hours as needed for itching or anxiety. 01/05/19   Connye Burkitt, NP  metroNIDAZOLE (FLAGYL) 500 MG tablet Take 1 tablet (500 mg total) by mouth 2 (two) times daily. 03/18/19   Eustaquio Maize, PA-C  nicotine polacrilex (NICORETTE) 2 MG gum Take 1 each (2 mg total) by mouth as needed for smoking cessation. 01/05/19   Connye Burkitt, NP  thiamine 100 MG tablet Take 1 tablet (100 mg total) by mouth daily. 01/06/19   Connye Burkitt, NP  traZODone (DESYREL) 50 MG tablet Take 1  tablet (50 mg total) by mouth at bedtime as needed for sleep. 01/05/19   Aldean Baker, NP    Allergies    Apple fruit extract  Review of Systems   Review of Systems  Constitutional: Negative for fever.  HENT: Negative for sore throat.   Respiratory: Negative for shortness of breath.   Cardiovascular: Negative for chest pain.  Gastrointestinal: Negative for abdominal pain, nausea and vomiting.  Genitourinary: Negative for flank pain.  Musculoskeletal: Negative for back pain.  Neurological: Negative for light-headedness and headaches.  Psychiatric/Behavioral: Positive for suicidal ideas.  All other systems reviewed and are negative.   Physical Exam Updated Vital Signs BP 133/86 (BP Location: Left Arm)   Pulse (!) 104   Temp 99 F (37.2 C) (Oral)   Resp 20   Ht 5\' 5"  (1.651 m)   LMP 11/06/2019   SpO2 100%   BMI 22.01 kg/m   Physical Exam Vitals and nursing note reviewed.  Constitutional:      General: She is not in acute  distress.    Appearance: She is well-developed.  HENT:     Head: Normocephalic and atraumatic.     Mouth/Throat:     Pharynx: No oropharyngeal exudate.  Eyes:     Pupils: Pupils are equal, round, and reactive to light.  Cardiovascular:     Rate and Rhythm: Regular rhythm.     Heart sounds: Normal heart sounds.  Pulmonary:     Effort: Pulmonary effort is normal. No respiratory distress.     Breath sounds: Normal breath sounds.  Abdominal:     General: Bowel sounds are normal. There is no distension.     Palpations: Abdomen is soft.     Tenderness: There is no abdominal tenderness.  Musculoskeletal:        General: No tenderness or deformity.     Cervical back: Normal range of motion.     Right lower leg: No edema.     Left lower leg: No edema.  Skin:    General: Skin is warm and dry.  Neurological:     Mental Status: She is alert and oriented to person, place, and time.  Psychiatric:        Speech: Speech normal.        Behavior: Behavior is cooperative.        Thought Content: Thought content is delusional. Thought content includes suicidal ideation. Thought content includes suicidal plan. Thought content does not include homicidal plan.     ED Results / Procedures / Treatments   Labs (all labs ordered are listed, but only abnormal results are displayed) Labs Reviewed  COMPREHENSIVE METABOLIC PANEL - Abnormal; Notable for the following components:      Result Value   Calcium 8.4 (*)    Total Protein 8.2 (*)    AST 62 (*)    All other components within normal limits  ETHANOL - Abnormal; Notable for the following components:   Alcohol, Ethyl (B) 409 (*)    All other components within normal limits  SALICYLATE LEVEL - Abnormal; Notable for the following components:   Salicylate Lvl <7.0 (*)    All other components within normal limits  ACETAMINOPHEN LEVEL - Abnormal; Notable for the following components:   Acetaminophen (Tylenol), Serum <10 (*)    All other components  within normal limits  CBC - Abnormal; Notable for the following components:   RDW 15.6 (*)    All other components within normal limits  RAPID URINE DRUG  SCREEN, HOSP PERFORMED  I-STAT BETA HCG BLOOD, ED ([facility], [facility], [facility] ONLY)    EKG None  Radiology No results found.  Procedures Procedures (including critical care time)  Medications Ordered in ED Medications  sodium chloride 0.9 % bolus 1,000 mL (has no administration in time range)    ED Course  I have reviewed the triage vital signs and the nursing notes.  Pertinent labs & imaging results that were available during my care of the patient were reviewed by me and considered in my medical decision making (see chart for details).  Clinical Course as of Nov 05 2099  Tue Nov 06, 2019  2030 Alcohol, Ethyl (B)(!!): 409 [JS]    Clinical Course User Index [JS] Claude Manges, PA-C   MDM Rules/Calculators/A&P   Patient with a PMH of alcohol abuse, MDD presents to the ED with complaints of suicidal ideations.  According to patient she is currently at home trying to detox from alcohol, states she has been drinking little by little daily, states she last drink today, had 3 shots of vodka prior to arrival in the ED.  She does report she has been feeling more upset and depressed usually, she states "I want to take a knife to my throat ".  Reports some homicidal ideations per EMS however she is denying that she would like to hurt her mother on today's visit.  Interpretation of her labs showed a CBC without any leukocytosis, hemoglobin without any signs of anemia.  CMP without any electro abnormality, AST is elevated on today's visit, consistent with her prior history of alcohol abuse.  Ethanol level was 409, given 1 L of fluids to help with alcohol intoxication.  Beta-hCG is negative.  Patient will need to be evaluated via TTS however remains intoxicated at this time.  Will need to metabolize to freedom prior to TTS consultation.  8:58 PM patient  is resting comfortably, TTS consultation has been ordered.    Portions of this note were generated with Scientist, clinical (histocompatibility and immunogenetics). Dictation errors may occur despite best attempts at proofreading.  Final Clinical Impression(s) / ED Diagnoses Final diagnoses:  Alcoholic intoxication without complication [facility])  Suicidal ideation    Rx / DC Orders ED Discharge Orders    None       Freddy Jaksch 11/06/19 2102    Benjiman Core, MD 11/07/19 [phone]

## 2019-11-06 NOTE — ED Triage Notes (Signed)
Per EMS- Patient is from home. Patient states she was trying to detox herself from alcohol, but drank Vodka and beer today. Patient told EMS that she was suicidal with a plan to "slice her throat." and stated she was wanting to kill her mother.  Patient states she normally drinks a gallon of Vodka daily  Patient was given Zofran 4 mg IV prior to arrival to the ED.

## 2019-11-07 ENCOUNTER — Encounter (HOSPITAL_COMMUNITY): Payer: Self-pay | Admitting: Registered Nurse

## 2019-11-07 MED ORDER — NICOTINE 21 MG/24HR TD PT24
21.0000 mg | MEDICATED_PATCH | Freq: Once | TRANSDERMAL | Status: DC
  Administered 2019-11-07: 21 mg via TRANSDERMAL
  Filled 2019-11-07: qty 1

## 2019-11-07 MED ORDER — LORAZEPAM 1 MG PO TABS: 0.0000 mg | ORAL_TABLET | Freq: Two times a day (BID) | ORAL | Status: DC

## 2019-11-07 MED ORDER — LORAZEPAM 2 MG/ML IJ SOLN: 0.0000 mg | Freq: Two times a day (BID) | INTRAMUSCULAR | Status: DC

## 2019-11-07 MED ORDER — THIAMINE HCL 100 MG/ML IJ SOLN: 100.0000 mg | Freq: Every day | INTRAMUSCULAR | Status: DC

## 2019-11-07 MED ORDER — THIAMINE HCL 100 MG PO TABS
100.0000 mg | ORAL_TABLET | Freq: Every day | ORAL | Status: DC
  Administered 2019-11-07: 100 mg via ORAL
  Filled 2019-11-07: qty 1

## 2019-11-07 MED ORDER — LORAZEPAM 1 MG PO TABS
0.0000 mg | ORAL_TABLET | Freq: Four times a day (QID) | ORAL | Status: DC
  Administered 2019-11-07 (×2): 2 mg via ORAL
  Filled 2019-11-07 (×2): qty 2

## 2019-11-07 MED ORDER — LORAZEPAM 2 MG/ML IJ SOLN
0.0000 mg | Freq: Four times a day (QID) | INTRAMUSCULAR | Status: DC
  Administered 2019-11-07: 2 mg via INTRAVENOUS
  Filled 2019-11-07: qty 1

## 2019-11-07 NOTE — Discharge Instructions (Signed)
For your behavioral health needs, you are advised to continue treatment with [facility]: ° °     [facility] °     [address] °     [place], [place] [address] °     [phone] °     Crisis number: [phone] ° °

## 2019-11-07 NOTE — Patient Outreach (Addendum)
CPSS met with the patient to provide substance use recovery support and provide help with getting connected to substance use treatment resources. Patient reports a history of alcohol use. Patient's UDS was positive for benzodiazepines. Patient's EtOH test was positive. Patient reports that she is currently experiencing alcohol withdrawal symptoms and would like help with a referral to a residential detox program. Patient reports no prior residential substance use treatment center admissions within the past year. Patient reports an inpatient psychiatric hospital admission last year at [facility]. CPSS also provided the patient with a detox treatment center list. [facility] does not have female detox beds available today. [facility] and [facility] both have female detox beds available today. CPSS will keep the patient updated on the status of these referrals. CPSS also provided information for additional substance use treatment resources including outpatient/residential substance use treatment center list, online/in-person [place] AA meeting list, and CPSS contact information. CPSS strongly encouraged the patient to continue stay in contact with CPSS if needed for further help with getting connected to substance use treatment resources.  

## 2019-11-07 NOTE — BH Assessment (Signed)
[facility] Assessment Progress Note  Per Shuvon Rankin, FNP, this pt does not require psychiatric hospitalization at this time.  Pt is psychiatrically cleared.  Discharge instructions advise pt to continue treatment through [facility], pt's last known outpatient provider.  Pt would also benefit from seeing Peer Support Specialists, and a peer support consult has been ordered for pt..  Pt's nurse, Edie, has been notified.  Doylene Canning, MA Triage Specialist [phone]

## 2019-11-07 NOTE — Patient Outreach (Addendum)
Patient was accepted and agreeable to go to [facility] for residential detox treatment to address the patient's current alcohol withdrawal symptoms. Patient will arrive to [facility] via [facility].

## 2019-11-07 NOTE — ED Notes (Signed)
Report given to [facility]. Patient transferred at this time with Helmut Muster, NT.

## 2019-11-07 NOTE — ED Notes (Signed)
Patient on telepsyche with TTS now in room. Sitter at bedside

## 2019-11-07 NOTE — BHH Suicide Risk Assessment (Cosign Needed)
Suicide Risk Assessment  Discharge Assessment   [facility] Discharge Suicide Risk Assessment   Principal Problem: Alcohol dependence with alcohol-induced mood disorder [facility]) Discharge Diagnoses: Principal Problem:   Alcohol dependence with alcohol-induced mood disorder (HCC)   Total Time spent with patient: 30 minutes  Musculoskeletal: Strength & Muscle Tone: within normal limits Gait & Station: normal Patient leans: N/A  Psychiatric Specialty Exam:   Blood pressure (!) 128/92, pulse 85, temperature 97.9 F (36.6 C), temperature source Oral, resp. rate 20, height 5\' 5"  (1.651 m), last menstrual period 11/06/2019, SpO2 94 %.Body mass index is 22.01 kg/m.  General Appearance: Casual  Eye Contact::  Good  Speech:  Clear and Coherent and Normal Rate409  Volume:  Normal  Mood:  Anxious and Depressed Stable  Affect:  Appropriate and Congruent  Thought Process:  Coherent, Goal Directed and Descriptions of Associations: Intact  Orientation:  Full (Time, Place, and Person)  Thought Content:  WDL and Logical  Suicidal Thoughts:  No  Homicidal Thoughts:  No  Memory:  Immediate;   Good Recent;   Good  Judgement:  Intact  Insight:  Present  Psychomotor Activity:  Normal  Concentration:  Good  Recall:  Good  Fund of Knowledge:Good  Language: Good  Akathisia:  No  Handed:  Right  AIMS (if indicated):     Assets:  Communication Skills Desire for Improvement Housing Social Support  Sleep:     Cognition: WNL  ADL's:  Intact   Mental Status Per Nursing Assessment::   On Admission:    [phone], 34 y.o., female patient seen via tele psych by this provider, consulted with Dr. 34; and chart reviewed on 11/07/19.  On evaluation YOLETTE HASTINGS reports she was intoxicated when she came to the hospital and stated that she was suicidal.  States that she is feeling much better this morning but would still like help with her alcohol problem.  At this time patient denies  suicidal/self-harm/homicidal ideation, psychosis, and paranoia.  States that she lives with her father and is currently unemployed.  Patient states that she had her first appointment at [facility] yesterday where she is starting a new program.   During evaluation ONDREA DOW is alert/oriented x 4; calm/cooperative; and mood is congruent with affect.  She does not appear to be responding to internal/external stimuli or delusional thoughts.  Patient denies suicidal/self-harm/homicidal ideation, psychosis, and paranoia.  Patient answered question appropriately.  Peer Support ordered to assist patient with detox/rehab facilities, community services for substance use disorder, and psychiatric services.     Demographic Factors:  Caucasian and Unemployed  Loss Factors: NA  Historical Factors: Impulsivity  Risk Reduction Factors:   Religious beliefs about death and Living with another person, especially a relative  Continued Clinical Symptoms:  Alcohol/Substance Abuse/Dependencies  Cognitive Features That Contribute To Risk:  None    Suicide Risk:  Minimal: No identifiable suicidal ideation.  Patients presenting with no risk factors but with morbid ruminations; may be classified as minimal risk based on the severity of the depressive symptoms    Plan Of Care/Follow-up recommendations:  Activity:  As tolerated Diet:  Heart healthy     Discharge Instructions     For your behavioral health needs, you are advised to continue treatment with [facility]:       [facility]      [address]      [place], [place]      [phone]      Crisis  number: [phone]     Disposition:  Psychiatrically cleared No evidence of imminent risk to self or others at present.   Patient does not meet criteria for psychiatric inpatient admission. Supportive therapy provided about ongoing stressors. Refer to IOP. Discussed crisis plan, support from social network, calling 911, coming to the  Emergency Department, and calling Suicide Hotline.  Vladislav Axelson, NP 11/07/2019, 12:06 PM
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
## 2019-11-07 NOTE — ED Notes (Signed)
Overnight observation and reassessment recommended by TTS

## 2019-11-18 ENCOUNTER — Encounter (HOSPITAL_COMMUNITY): Payer: Self-pay

## 2019-11-18 ENCOUNTER — Other Ambulatory Visit: Payer: Self-pay

## 2019-11-18 ENCOUNTER — Emergency Department (HOSPITAL_COMMUNITY)
Admission: EM | Admit: 2019-11-18 | Discharge: 2019-11-18 | Disposition: A | Discharge status: Home / Self Care | Payer: Medicaid Other | Attending: Emergency Medicine | Admitting: Emergency Medicine

## 2019-11-18 DIAGNOSIS — F329 Major depressive disorder, single episode, unspecified: Secondary | ICD-10-CM | POA: Insufficient documentation

## 2019-11-18 DIAGNOSIS — R45851 Suicidal ideations: Secondary | ICD-10-CM | POA: Diagnosis not present

## 2019-11-18 DIAGNOSIS — F10920 Alcohol use, unspecified with intoxication, uncomplicated: Secondary | ICD-10-CM | POA: Insufficient documentation

## 2019-11-18 LAB — I-STAT BETA HCG BLOOD, ED (MC, WL, AP ONLY): I-stat hCG, quantitative: <5 m[IU]/mL (ref <5)

## 2019-11-18 LAB — URINALYSIS, ROUTINE W REFLEX MICROSCOPIC
Bacteria, UA: NONE SEEN
Bilirubin Urine: NEGATIVE
Glucose, UA: NEGATIVE mg/dL
Hgb urine dipstick: NEGATIVE
Ketones, ur: NEGATIVE mg/dL
Nitrite: NEGATIVE
Protein, ur: NEGATIVE mg/dL
Specific Gravity, Urine: 1.01 (ref 1.005–1.030)
pH: 7 (ref 5.0–8.0)

## 2019-11-18 LAB — CBC WITH DIFFERENTIAL/PLATELET
Abs Immature Granulocytes: 0.01 10*3/uL (ref 0.00–0.07)
Basophils Absolute: 0.1 10*3/uL (ref 0.0–0.1)
Basophils Relative: 1 %
Eosinophils Absolute: 0.1 10*3/uL (ref 0.0–0.5)
Eosinophils Relative: 1 %
HCT: 37.3 % (ref 36.0–46.0)
Hemoglobin: 12.7 g/dL (ref 12.0–15.0)
Immature Granulocytes: 0 %
Lymphocytes Relative: 43 %
Lymphs Abs: 2.2 10*3/uL (ref 0.7–4.0)
MCH: 33.8 pg (ref 26.0–34.0)
MCHC: 34 g/dL (ref 30.0–36.0)
MCV: 99.2 fL (ref 80.0–100.0)
Monocytes Absolute: 0.2 10*3/uL (ref 0.1–1.0)
Monocytes Relative: 5 %
Neutro Abs: 2.6 10*3/uL (ref 1.7–7.7)
Neutrophils Relative %: 50 %
Platelets: 340 10*3/uL (ref 150–400)
RBC: 3.76 MIL/uL — ABNORMAL LOW (ref 3.87–5.11)
RDW: 14.8 % (ref 11.5–15.5)
WBC: 5.1 10*3/uL (ref 4.0–10.5)
nRBC: 0 % (ref 0.0–0.2)

## 2019-11-18 LAB — ACETAMINOPHEN LEVEL: Acetaminophen (Tylenol), Serum: <10 ug/mL — ABNORMAL LOW (ref 10–30)

## 2019-11-18 LAB — RAPID URINE DRUG SCREEN, HOSP PERFORMED
Amphetamines: NOT DETECTED
Barbiturates: NOT DETECTED
Benzodiazepines: POSITIVE — AB
Cocaine: NOT DETECTED
Opiates: NOT DETECTED
Tetrahydrocannabinol: NOT DETECTED

## 2019-11-18 LAB — COMPREHENSIVE METABOLIC PANEL
ALT: 29 U/L (ref 0–44)
AST: 36 U/L (ref 15–41)
Albumin: 3.9 g/dL (ref 3.5–5.0)
Alkaline Phosphatase: 73 U/L (ref 38–126)
Anion gap: 14 (ref 5–15)
BUN: 8 mg/dL (ref 6–20)
CO2: 24 mmol/L (ref 22–32)
Calcium: 8.4 mg/dL — ABNORMAL LOW (ref 8.9–10.3)
Chloride: 105 mmol/L (ref 98–111)
Creatinine, Ser: 0.65 mg/dL (ref 0.44–1.00)
GFR calc Af Amer: >60 mL/min (ref >60)
GFR calc non Af Amer: >60 mL/min (ref >60)
Glucose, Bld: 88 mg/dL (ref 70–99)
Potassium: 3.5 mmol/L (ref 3.5–5.1)
Sodium: 143 mmol/L (ref 135–145)
Total Bilirubin: 0.3 mg/dL (ref 0.3–1.2)
Total Protein: 6.9 g/dL (ref 6.5–8.1)

## 2019-11-18 LAB — SALICYLATE LEVEL: Salicylate Lvl: <7 mg/dL — ABNORMAL LOW (ref 7.0–30.0)

## 2019-11-18 LAB — ETHANOL: Alcohol, Ethyl (B): 413 mg/dL (ref <10)

## 2019-11-18 MED ORDER — LORAZEPAM 1 MG PO TABS
1.0000 mg | ORAL_TABLET | Freq: Once | ORAL | Status: AC
  Administered 2019-11-18: 1 mg via ORAL
  Filled 2019-11-18: qty 1

## 2019-11-18 NOTE — ED Provider Notes (Signed)
[facility] EMERGENCY DEPARTMENT Provider Note   CSN: [id number] Arrival date & time: 11/18/19  0125     History Chief Complaint  Patient presents with  . Alcohol Intoxication    Michele Rich is a 34 y.o. female.  Patient is a 34 year old female with past medical history of alcoholism and depression.  She presents today for evaluation of suicidal ideation and alcohol intoxication.  Patient had some sort of dispute with her mother which caused her to drink excessively this evening.  Patient also expressed suicidal ideation to her mother and to me.  She has no plan.  The history is provided by the patient.  Alcohol Intoxication This is a recurrent problem. The problem occurs constantly. Nothing aggravates the symptoms. Nothing relieves the symptoms.       Past Medical History:  Diagnosis Date  . Alcoholism (HCC)   . HPV in female   . Medical history non-contributory     Patient Active Problem List   Diagnosis Date Noted  . MDD (major depressive disorder) 01/04/2019  . Alcohol dependence with alcohol-induced mood disorder (HCC) 01/04/2019    Past Surgical History:  Procedure Laterality Date  . NO PAST SURGERIES       OB History    Gravida  3   Para  2   Term  2   Preterm      AB  1   Living  2     SAB      TAB      Ectopic      Multiple      Live Births              Family History  Family history unknown: Yes    Social History   Tobacco Use  . Smoking status: Current Every Day Smoker    Packs/day: 0.10    Types: Cigarettes  . Smokeless tobacco: Never Used  Substance Use Topics  . Alcohol use: Yes    Comment: daily  . Drug use: No    Home Medications Prior to Admission medications   Medication Sig Start Date End Date Taking? Authorizing Provider  hydrOXYzine (ATARAX/VISTARIL) 25 MG tablet Take 1 tablet (25 mg total) by mouth every 6 (six) hours as needed for itching or anxiety. 01/05/19   Aldean Baker, NP      Allergies    Apple fruit extract  Review of Systems   Review of Systems  All other systems reviewed and are negative.   Physical Exam Updated Vital Signs Ht 5\' 5"  (1.651 m)   Wt 78.5 kg   LMP 11/06/2019   SpO2 98%   BMI 28.79 kg/m   Physical Exam Vitals and nursing note reviewed.  Constitutional:      General: She is not in acute distress.    Appearance: She is well-developed. She is not diaphoretic.  HENT:     Head: Normocephalic and atraumatic.  Cardiovascular:     Rate and Rhythm: Normal rate and regular rhythm.     Heart sounds: No murmur. No friction rub. No gallop.   Pulmonary:     Effort: Pulmonary effort is normal. No respiratory distress.     Breath sounds: Normal breath sounds. No wheezing.  Abdominal:     General: Bowel sounds are normal. There is no distension.     Palpations: Abdomen is soft.     Tenderness: There is no abdominal tenderness.  Musculoskeletal:        General: Normal  range of motion.     Cervical back: Normal range of motion and neck supple.  Skin:    General: Skin is warm and dry.  Neurological:     General: No focal deficit present.     Mental Status: She is alert and oriented to person, place, and time.     Cranial Nerves: No cranial nerve deficit.     Sensory: No sensory deficit.     ED Results / Procedures / Treatments   Labs (all labs ordered are listed, but only abnormal results are displayed) Labs Reviewed  COMPREHENSIVE METABOLIC PANEL  SALICYLATE LEVEL  ACETAMINOPHEN LEVEL  ETHANOL  CBC WITH DIFFERENTIAL/PLATELET  URINALYSIS, ROUTINE W REFLEX MICROSCOPIC  RAPID URINE DRUG SCREEN, HOSP PERFORMED  I-STAT BETA HCG BLOOD, ED ([facility], [facility], [facility] ONLY)    EKG None  Radiology No results found.  Procedures Procedures (including critical care time)  Medications Ordered in ED Medications - No data to display  ED Course  I have reviewed the triage vital signs and the nursing notes.  Pertinent labs & imaging results  that were available during my care of the patient were reviewed by me and considered in my medical decision making (see chart for details).    MDM Rules/Calculators/A&P  Patient here intoxicated and potentially suicidal on a voluntary basis.  She appears medically clear at this point for TTS consultation.  Final Clinical Impression(s) / ED Diagnoses Final diagnoses:  None    Rx / DC Orders ED Discharge Orders    None       Veryl Speak, MD 11/18/19 [phone]

## 2019-11-18 NOTE — ED Notes (Signed)
Patient Alert and oriented to baseline. Stable and ambulatory to baseline. Patient verbalized understanding of the discharge instructions.  Patient belongings were taken by the patient.   

## 2019-11-18 NOTE — ED Notes (Signed)
Pt resting in bed at this time, NAD. Provided sandwich, crackers, and ginger ale. No complaints verbalized.

## 2019-11-18 NOTE — ED Triage Notes (Signed)
Pt arrived with [facility] due to ETOH and SI. Pt was arguing with mother and drank significant amount of vodka today. Mother called PD, pt requesting help with detox and SI.   NAD. VSS.
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
## 2020-01-15 ENCOUNTER — Emergency Department (HOSPITAL_COMMUNITY)
Admission: EM | Admit: 2020-01-15 | Discharge: 2020-01-15 | Disposition: A | Discharge status: Home / Self Care | Payer: Medicaid Other | Attending: Emergency Medicine | Admitting: Emergency Medicine

## 2020-01-15 ENCOUNTER — Encounter (HOSPITAL_COMMUNITY): Payer: Self-pay

## 2020-01-15 ENCOUNTER — Other Ambulatory Visit: Payer: Self-pay

## 2020-01-15 DIAGNOSIS — F17210 Nicotine dependence, cigarettes, uncomplicated: Secondary | ICD-10-CM | POA: Diagnosis not present

## 2020-01-15 DIAGNOSIS — F1010 Alcohol abuse, uncomplicated: Secondary | ICD-10-CM | POA: Insufficient documentation

## 2020-01-15 DIAGNOSIS — Z79899 Other long term (current) drug therapy: Secondary | ICD-10-CM | POA: Diagnosis not present

## 2020-01-15 LAB — URINALYSIS, ROUTINE W REFLEX MICROSCOPIC
Bacteria, UA: NONE SEEN
Bilirubin Urine: NEGATIVE
Glucose, UA: 50 mg/dL — AB
Ketones, ur: 20 mg/dL — AB
Leukocytes,Ua: NEGATIVE
Nitrite: NEGATIVE
Protein, ur: >=300 mg/dL — AB
Specific Gravity, Urine: 1.028 (ref 1.005–1.030)
pH: 5 (ref 5.0–8.0)

## 2020-01-15 LAB — COMPREHENSIVE METABOLIC PANEL
ALT: 144 U/L — ABNORMAL HIGH (ref 0–44)
AST: 303 U/L — ABNORMAL HIGH (ref 15–41)
Albumin: 5.4 g/dL — ABNORMAL HIGH (ref 3.5–5.0)
Alkaline Phosphatase: 73 U/L (ref 38–126)
Anion gap: 22 — ABNORMAL HIGH (ref 5–15)
BUN: 7 mg/dL (ref 6–20)
CO2: 22 mmol/L (ref 22–32)
Calcium: 9.2 mg/dL (ref 8.9–10.3)
Chloride: 96 mmol/L — ABNORMAL LOW (ref 98–111)
Creatinine, Ser: 0.65 mg/dL (ref 0.44–1.00)
GFR calc Af Amer: >60 mL/min (ref >60)
GFR calc non Af Amer: >60 mL/min (ref >60)
Glucose, Bld: 115 mg/dL — ABNORMAL HIGH (ref 70–99)
Potassium: 3.8 mmol/L (ref 3.5–5.1)
Sodium: 140 mmol/L (ref 135–145)
Total Bilirubin: 1.8 mg/dL — ABNORMAL HIGH (ref 0.3–1.2)
Total Protein: 9 g/dL — ABNORMAL HIGH (ref 6.5–8.1)

## 2020-01-15 LAB — CBC WITH DIFFERENTIAL/PLATELET
Abs Immature Granulocytes: 0.03 10*3/uL (ref 0.00–0.07)
Basophils Absolute: 0 10*3/uL (ref 0.0–0.1)
Basophils Relative: 0 %
Eosinophils Absolute: 0 10*3/uL (ref 0.0–0.5)
Eosinophils Relative: 0 %
HCT: 42.5 % (ref 36.0–46.0)
Hemoglobin: 14.1 g/dL (ref 12.0–15.0)
Immature Granulocytes: 0 %
Lymphocytes Relative: 7 %
Lymphs Abs: 0.6 10*3/uL — ABNORMAL LOW (ref 0.7–4.0)
MCH: 33.2 pg (ref 26.0–34.0)
MCHC: 33.2 g/dL (ref 30.0–36.0)
MCV: 100 fL (ref 80.0–100.0)
Monocytes Absolute: 0.2 10*3/uL (ref 0.1–1.0)
Monocytes Relative: 2 %
Neutro Abs: 7 10*3/uL (ref 1.7–7.7)
Neutrophils Relative %: 91 %
Platelets: 143 10*3/uL — ABNORMAL LOW (ref 150–400)
RBC: 4.25 MIL/uL (ref 3.87–5.11)
RDW: 13.5 % (ref 11.5–15.5)
WBC: 7.8 10*3/uL (ref 4.0–10.5)
nRBC: 0 % (ref 0.0–0.2)

## 2020-01-15 LAB — LIPASE, BLOOD: Lipase: 24 U/L (ref 11–51)

## 2020-01-15 LAB — MAGNESIUM: Magnesium: 1.9 mg/dL (ref 1.7–2.4)

## 2020-01-15 LAB — ETHANOL: Alcohol, Ethyl (B): 52 mg/dL — ABNORMAL HIGH (ref <10)

## 2020-01-15 LAB — I-STAT BETA HCG BLOOD, ED (MC, WL, AP ONLY): I-stat hCG, quantitative: <5 m[IU]/mL (ref <5)

## 2020-01-15 MED ORDER — THIAMINE HCL 100 MG/ML IJ SOLN
100.0000 mg | Freq: Every day | INTRAMUSCULAR | Status: DC
  Administered 2020-01-15: 100 mg via INTRAVENOUS
  Filled 2020-01-15: qty 2

## 2020-01-15 MED ORDER — THIAMINE HCL 100 MG PO TABS
100.0000 mg | ORAL_TABLET | Freq: Every day | ORAL | Status: DC
  Filled 2020-01-15: qty 1

## 2020-01-15 MED ORDER — CHLORDIAZEPOXIDE HCL 25 MG PO CAPS
ORAL_CAPSULE | ORAL | 0 refills | Status: DC
Start: 2020-01-15 — End: 2020-02-23

## 2020-01-15 MED ORDER — ONDANSETRON HCL 4 MG/2ML IJ SOLN
4.0000 mg | Freq: Once | INTRAMUSCULAR | Status: AC
  Administered 2020-01-15: 4 mg via INTRAVENOUS
  Filled 2020-01-15: qty 2

## 2020-01-15 MED ORDER — LORAZEPAM 2 MG/ML IJ SOLN: 0.0000 mg | Freq: Two times a day (BID) | INTRAMUSCULAR | Status: DC

## 2020-01-15 MED ORDER — SODIUM CHLORIDE 0.9 % IV BOLUS
1000.0000 mL | Freq: Once | INTRAVENOUS | Status: AC
  Administered 2020-01-15: 1000 mL via INTRAVENOUS

## 2020-01-15 MED ORDER — LORAZEPAM 2 MG/ML IJ SOLN
0.0000 mg | Freq: Four times a day (QID) | INTRAMUSCULAR | Status: DC
  Administered 2020-01-15: 2 mg via INTRAVENOUS
  Filled 2020-01-15: qty 1

## 2020-01-15 MED ORDER — LORAZEPAM 1 MG PO TABS: 0.0000 mg | ORAL_TABLET | Freq: Two times a day (BID) | ORAL | Status: DC

## 2020-01-15 MED ORDER — LORAZEPAM 1 MG PO TABS: 0.0000 mg | ORAL_TABLET | Freq: Four times a day (QID) | ORAL | Status: DC

## 2020-01-15 NOTE — ED Provider Notes (Signed)
[place] DEPT Provider Note   CSN: [id number] Arrival date & time: 01/15/20  1514     History Chief Complaint  Patient presents with  . Withdrawal    Michele Rich is a 34 y.o. female.with history of EtOH abuse was most recently in [facility] for detox about 6 weeks ago, completed a phenobarbital taper and discharged back to her home. She remained sober for about a month but reports she started drinking again about 2 weeks ago. She was initially drinking a pint of vodka a day and has been tapering that on her own, down to 3 shots per day. She reports 2 days of persistent vomiting, non bloody associated with decreased stool and abdominal bloating but no abdominal pain. Today she has felt lightheaded with standing, but has not passed out. She denies any fever, cough congestion or chest pain/SOB.   HPI     Past Medical History:  Diagnosis Date  . Alcoholism ([place])   . HPV in female   . Medical history non-contributory     Patient Active Problem List   Diagnosis Date Noted  . MDD (major depressive disorder) 01/04/2019  . Alcohol dependence with alcohol-induced mood disorder ([place]) 01/04/2019    Past Surgical History:  Procedure Laterality Date  . NO PAST SURGERIES       OB History    Gravida  3   Para  2   Term  2   Preterm      AB  1   Living  2     SAB      TAB      Ectopic      Multiple      Live Births              Family History  Family history unknown: Yes    Social History   Tobacco Use  . Smoking status: Current Every Day Smoker    Packs/day: 0.10    Types: Cigarettes  . Smokeless tobacco: Never Used  Substance Use Topics  . Alcohol use: Yes    Comment: daily  . Drug use: No    Home Medications Prior to Admission medications   Medication Sig Start Date End Date Taking? Authorizing Provider  chlordiazePOXIDE (LIBRIUM) 25 MG capsule 50mg  PO TID x 1D, then 25-50mg  PO BID X 1D, then 25-50mg  PO  QD X 1D 01/15/20   Truddie Hidden, MD  hydrOXYzine (ATARAX/VISTARIL) 25 MG tablet Take 1 tablet (25 mg total) by mouth every 6 (six) hours as needed for itching or anxiety. Patient taking differently: Take 25 mg by mouth 3 (three) times daily.  01/05/19   Connye Burkitt, NP  traZODone (DESYREL) 50 MG tablet Take 50 mg by mouth at bedtime as needed (sleep).     [provider]    Allergies    Apple fruit extract  Review of Systems   Review of Systems A comprehensive review of systems was completed and negative except as noted in HPI.   Physical Exam Updated Vital Signs BP (!) 124/95   Pulse (!) 117   Temp 98.3 F (36.8 C) (Oral)   Resp 16   Ht 5\' 5"  (1.651 m)   Wt 81.6 kg   LMP 11/15/2019   SpO2 95%   BMI 29.95 kg/m   Physical Exam Vitals and nursing note reviewed.  Constitutional:      Appearance: Normal appearance.  HENT:     Head: Normocephalic and atraumatic.  Nose: Nose normal.     Mouth/Throat:     Mouth: Mucous membranes are moist.  Eyes:     Extraocular Movements: Extraocular movements intact.     Conjunctiva/sclera: Conjunctivae normal.  Cardiovascular:     Rate and Rhythm: Tachycardia present.  Pulmonary:     Effort: Pulmonary effort is normal.     Breath sounds: Normal breath sounds.  Abdominal:     General: Abdomen is flat. There is no distension.     Palpations: Abdomen is soft. There is no mass.     Tenderness: There is no abdominal tenderness. There is no guarding.  Musculoskeletal:        General: No swelling. Normal range of motion.     Cervical back: Neck supple.  Skin:    General: Skin is warm and dry.  Neurological:     General: No focal deficit present.     Mental Status: She is alert.  Psychiatric:        Mood and Affect: Mood normal.     ED Results / Procedures / Treatments   Labs (all labs ordered are listed, but only abnormal results are displayed) Labs Reviewed  CBC WITH DIFFERENTIAL/PLATELET - Abnormal; Notable  for the following components:      Result Value   Platelets 143 (*)    Lymphs Abs 0.6 (*)    All other components within normal limits  COMPREHENSIVE METABOLIC PANEL - Abnormal; Notable for the following components:   Chloride 96 (*)    Glucose, Bld 115 (*)    Total Protein 9.0 (*)    Albumin 5.4 (*)    AST 303 (*)    ALT 144 (*)    Total Bilirubin 1.8 (*)    Anion gap 22 (*)    All other components within normal limits  URINALYSIS, ROUTINE W REFLEX MICROSCOPIC - Abnormal; Notable for the following components:   Color, Urine AMBER (*)    APPearance HAZY (*)    Glucose, UA 50 (*)    Hgb urine dipstick SMALL (*)    Ketones, ur 20 (*)    Protein, ur >=300 (*)    All other components within normal limits  ETHANOL - Abnormal; Notable for the following components:   Alcohol, Ethyl (B) 52 (*)    All other components within normal limits  LIPASE, BLOOD  MAGNESIUM  I-STAT BETA HCG BLOOD, ED ([facility], [facility], [facility] ONLY)    EKG None  Radiology No results found.  Procedures Procedures (including critical care time)  Medications Ordered in ED Medications  LORazepam (ATIVAN) injection 0-4 mg (2 mg Intravenous Given 01/15/20 1748)    Or  LORazepam (ATIVAN) tablet 0-4 mg ( Oral See Alternative 01/15/20 1748)  LORazepam (ATIVAN) injection 0-4 mg (has no administration in time range)    Or  LORazepam (ATIVAN) tablet 0-4 mg (has no administration in time range)  thiamine tablet 100 mg ( Oral See Alternative 01/15/20 1751)    Or  thiamine (B-1) injection 100 mg (100 mg Intravenous Given 01/15/20 1751)  sodium chloride 0.9 % bolus 1,000 mL (1,000 mLs Intravenous New Bag/Given (Non-Interop) 01/15/20 1745)  ondansetron (ZOFRAN) injection 4 mg (4 mg Intravenous Given 01/15/20 1746)    ED Course  I have reviewed the triage vital signs and the nursing notes.  Pertinent labs & imaging results that were available during my care of the patient were reviewed by me and considered in my medical decision making  (see chart for details).  Clinical Course  as of Jan 15 2040  Tue Jan 15, 2020  1549 CIWA protocol initiated while awaiting IVF and labs.    [CS]  1820 Patient's HR improving. Awaiting labs. Resting comfortably in bed.    [CS]  2037 Labs reviewed and LFTs worse from previous but not critically so. EtOH mildly elevated despite delay in getting sample drawn. She has had an elevated CIWA, given ativan with improvement. Still remains mildly tachycardic, but otherwise CIWA is much lower. Discussed outpatient treatment with Librium taper and patient is amenable.    [CS]    Clinical Course User Index [CS] Pollyann Savoy, MD   MDM Rules/Calculators/A&P                      Patient here with vomiting in setting of EtOH abuse, possibly some degree of withdrawal. She has not had any hallucinations or seizures although she has in the past. Her abdomen is benign. Will check labs, given IVF/antiemetics and reassess.  Final Clinical Impression(s) / ED Diagnoses Final diagnoses:  Alcohol abuse    Rx / DC Orders ED Discharge Orders         Ordered    chlordiazePOXIDE (LIBRIUM) 25 MG capsule     01/15/20 2041           Pollyann Savoy, MD 01/15/20 2041

## 2020-01-15 NOTE — ED Triage Notes (Signed)
Per [facility] pt from home c/o ETOH withdrawal-head feels weird, disoriented, n/v. Had a shot earlier today but normally drinks vodka throughout the day and hasn't drank per her normal for 3 days.  Vitals: HR 100, R 18, 99%, 90CBg, 97.7 temp, 124/80.

## 2020-01-15 NOTE — ED Triage Notes (Signed)
Patient states her last drink ws at 0600 today and she had 2 shots of Vodka.

## 2020-01-29 ENCOUNTER — Encounter (HOSPITAL_COMMUNITY): Payer: Self-pay | Admitting: Emergency Medicine

## 2020-01-29 ENCOUNTER — Emergency Department (HOSPITAL_COMMUNITY)
Admission: EM | Admit: 2020-01-29 | Discharge: 2020-01-31 | Disposition: A | Discharge status: Home / Self Care | Payer: Medicaid Other | Attending: Emergency Medicine | Admitting: Emergency Medicine

## 2020-01-29 ENCOUNTER — Other Ambulatory Visit: Payer: Self-pay

## 2020-01-29 DIAGNOSIS — Y908 Blood alcohol level of 240 mg/100 ml or more: Secondary | ICD-10-CM | POA: Insufficient documentation

## 2020-01-29 DIAGNOSIS — F332 Major depressive disorder, recurrent severe without psychotic features: Secondary | ICD-10-CM | POA: Insufficient documentation

## 2020-01-29 DIAGNOSIS — Z046 Encounter for general psychiatric examination, requested by authority: Secondary | ICD-10-CM

## 2020-01-29 DIAGNOSIS — R45851 Suicidal ideations: Secondary | ICD-10-CM | POA: Insufficient documentation

## 2020-01-29 DIAGNOSIS — F17210 Nicotine dependence, cigarettes, uncomplicated: Secondary | ICD-10-CM | POA: Diagnosis not present

## 2020-01-29 DIAGNOSIS — F10929 Alcohol use, unspecified with intoxication, unspecified: Secondary | ICD-10-CM | POA: Insufficient documentation

## 2020-01-29 DIAGNOSIS — Z20822 Contact with and (suspected) exposure to covid-19: Secondary | ICD-10-CM | POA: Insufficient documentation

## 2020-01-29 LAB — I-STAT BETA HCG BLOOD, ED (MC, WL, AP ONLY): I-stat hCG, quantitative: <5 m[IU]/mL (ref <5)

## 2020-01-29 LAB — RAPID URINE DRUG SCREEN, HOSP PERFORMED
Amphetamines: NOT DETECTED
Barbiturates: NOT DETECTED
Benzodiazepines: POSITIVE — AB
Cocaine: NOT DETECTED
Opiates: NOT DETECTED
Tetrahydrocannabinol: NOT DETECTED

## 2020-01-29 LAB — COMPREHENSIVE METABOLIC PANEL
ALT: 53 U/L — ABNORMAL HIGH (ref 0–44)
AST: 59 U/L — ABNORMAL HIGH (ref 15–41)
Albumin: 4.3 g/dL (ref 3.5–5.0)
Alkaline Phosphatase: 78 U/L (ref 38–126)
Anion gap: 17 — ABNORMAL HIGH (ref 5–15)
BUN: 5 mg/dL — ABNORMAL LOW (ref 6–20)
CO2: 22 mmol/L (ref 22–32)
Calcium: 8.7 mg/dL — ABNORMAL LOW (ref 8.9–10.3)
Chloride: 106 mmol/L (ref 98–111)
Creatinine, Ser: 0.72 mg/dL (ref 0.44–1.00)
GFR calc Af Amer: >60 mL/min (ref >60)
GFR calc non Af Amer: >60 mL/min (ref >60)
Glucose, Bld: 85 mg/dL (ref 70–99)
Potassium: 3.3 mmol/L — ABNORMAL LOW (ref 3.5–5.1)
Sodium: 145 mmol/L (ref 135–145)
Total Bilirubin: 0.5 mg/dL (ref 0.3–1.2)
Total Protein: 7.6 g/dL (ref 6.5–8.1)

## 2020-01-29 LAB — CBC
HCT: 39.8 % (ref 36.0–46.0)
Hemoglobin: 13 g/dL (ref 12.0–15.0)
MCH: 33 pg (ref 26.0–34.0)
MCHC: 32.7 g/dL (ref 30.0–36.0)
MCV: 101 fL — ABNORMAL HIGH (ref 80.0–100.0)
Platelets: 331 10*3/uL (ref 150–400)
RBC: 3.94 MIL/uL (ref 3.87–5.11)
RDW: 14 % (ref 11.5–15.5)
WBC: 5.4 10*3/uL (ref 4.0–10.5)
nRBC: 0 % (ref 0.0–0.2)

## 2020-01-29 LAB — SALICYLATE LEVEL: Salicylate Lvl: <7 mg/dL — ABNORMAL LOW (ref 7.0–30.0)

## 2020-01-29 LAB — ACETAMINOPHEN LEVEL: Acetaminophen (Tylenol), Serum: <10 ug/mL — ABNORMAL LOW (ref 10–30)

## 2020-01-29 LAB — ETHANOL: Alcohol, Ethyl (B): 392 mg/dL (ref <10)

## 2020-01-29 LAB — SARS CORONAVIRUS 2 BY RT PCR (HOSPITAL ORDER, PERFORMED IN ~~LOC~~ HOSPITAL LAB): SARS Coronavirus 2: NEGATIVE

## 2020-01-29 LAB — MAGNESIUM: Magnesium: 2 mg/dL (ref 1.7–2.4)

## 2020-01-29 MED ORDER — FOLIC ACID 1 MG PO TABS: 1.0000 mg | ORAL_TABLET | Freq: Once | ORAL | Status: DC

## 2020-01-29 MED ORDER — LORAZEPAM 2 MG/ML IJ SOLN: 0.0000 mg | Freq: Four times a day (QID) | INTRAMUSCULAR | Status: DC

## 2020-01-29 MED ORDER — POTASSIUM CHLORIDE CRYS ER 20 MEQ PO TBCR
40.0000 meq | EXTENDED_RELEASE_TABLET | Freq: Once | ORAL | Status: AC
  Administered 2020-01-29: 40 meq via ORAL
  Filled 2020-01-29: qty 2

## 2020-01-29 MED ORDER — LORAZEPAM 2 MG/ML IJ SOLN: 0.0000 mg | Freq: Two times a day (BID) | INTRAMUSCULAR | Status: DC

## 2020-01-29 MED ORDER — LORAZEPAM 1 MG PO TABS
0.0000 mg | ORAL_TABLET | Freq: Four times a day (QID) | ORAL | Status: DC
  Administered 2020-01-30: 2 mg via ORAL
  Administered 2020-01-30 (×2): 1 mg via ORAL
  Filled 2020-01-29 (×2): qty 1
  Filled 2020-01-29: qty 2
  Filled 2020-01-29: qty 1

## 2020-01-29 MED ORDER — LORAZEPAM 1 MG PO TABS: 0.0000 mg | ORAL_TABLET | Freq: Two times a day (BID) | ORAL | Status: DC

## 2020-01-29 MED ORDER — THIAMINE HCL 100 MG/ML IJ SOLN: 100.0000 mg | Freq: Once | INTRAMUSCULAR | Status: DC

## 2020-01-29 MED ORDER — THIAMINE HCL 100 MG/ML IJ SOLN: 100.0000 mg | Freq: Every day | INTRAMUSCULAR | Status: DC

## 2020-01-29 MED ORDER — LORAZEPAM 2 MG/ML IJ SOLN: 1.0000 mg | Freq: Once | INTRAMUSCULAR | Status: DC

## 2020-01-29 MED ORDER — THIAMINE HCL 100 MG PO TABS: 100.0000 mg | ORAL_TABLET | Freq: Every day | ORAL | Status: DC

## 2020-01-29 MED ORDER — FOLIC ACID 1 MG PO TABS
1.0000 mg | ORAL_TABLET | Freq: Once | ORAL | Status: AC
  Administered 2020-01-29: 1 mg via ORAL

## 2020-01-29 MED ORDER — THIAMINE HCL 100 MG PO TABS
100.0000 mg | ORAL_TABLET | Freq: Every day | ORAL | Status: DC
  Administered 2020-01-29 – 2020-01-30 (×3): 100 mg via ORAL
  Filled 2020-01-29 (×3): qty 1

## 2020-01-29 MED ORDER — LORAZEPAM 1 MG PO TABS: 0.0000 mg | ORAL_TABLET | Freq: Four times a day (QID) | ORAL | Status: DC

## 2020-01-29 MED ORDER — SODIUM CHLORIDE 0.9 % IV BOLUS
1000.0000 mL | Freq: Once | INTRAVENOUS | Status: AC
  Administered 2020-01-29: 1000 mL via INTRAVENOUS

## 2020-01-29 NOTE — BH Assessment (Signed)
Comprehensive Clinical Assessment (CCA) Screening, Triage and Referral Note  01/29/2020 Michele Rich [id number]  Visit Diagnosis:    ICD-10-CM   1. Involuntary commitment  Z04.6   2. Alcoholic intoxication with complication Michele Rich    Patient presenting with SI with plan to overdose on pills. Patient was placed under IVC by mobile crisis worker. PER IVC, patient is a danger to self, has stated she has thoughts of killing herself by overdosing on prescription medications. Patient reported that she was in an argument with mother after mother would not give her panic medications. Patient is currently being seen by [facility], last seen 1 week ago. Patient reported worsening depressive symptoms.  Patient Reported Information How did you hear about [place]? Family/Friend   Referral name: Michele Rich, mother   Referral phone number: No data recorded Whom do you see for routine medical problems? I don't have a doctor   Practice/Facility Name: No data recorded  Practice/Facility Phone Number: No data recorded  Name of Contact: No data recorded  Contact Number: No data recorded  Contact Fax Number: No data recorded  Prescriber Name: No data recorded  Prescriber Address (if known): No data recorded What Is the Reason for Your Visit/Call Today? No data recorded How Long Has This Been Causing You Problems? No data recorded Have You Recently Been in Any Inpatient Treatment (Hospital/Detox/Crisis Center/28-Day Program)? No   Name/Location of Program/Hospital:No data recorded  How Long Were You There? No data recorded  When Were You Discharged? No data recorded Have You Ever Received Services From [facility] Before? No   Who Do You See at [facility]? No data recorded Have You Recently Had Any Thoughts About Hurting Yourself? No   Are You Planning to Commit Suicide/Harm Yourself At This time?  No  Have you Recently Had Thoughts About Hurting Someone Michele Rich? No   Explanation: No data  recorded Have You Used Any Alcohol or Drugs in the Past 24 Hours? No   How Long Ago Did You Use Drugs or Alcohol?  No data recorded  What Did You Use and How Much? No data recorded What Do You Feel Would Help You the Most Today? No data recorded Do You Currently Have a Therapist/Psychiatrist? Yes   Name of Therapist/Psychiatrist: [facility]   Have You Been Recently Discharged From Any Office Practice or Programs? No   Explanation of Discharge From Practice/Program:  No data recorded    CCA Screening Triage Referral Assessment Type of Contact: Tele-Assessment   Is this Initial or Reassessment? Initial Assessment   Date Telepsych consult ordered in [facility]:  No data recorded  Time Telepsych consult ordered in [facility]:  No data recorded Patient Reported Information Reviewed? Yes   Patient Left Without Being Seen? No data recorded  Reason for Not Completing Assessment: No data recorded Collateral Involvement: IVC paperwork  Does Patient Have a Court Appointed Legal Guardian? No data recorded  Name and Contact of Legal Guardian:  NA  If Minor and Not Living with Parent(s), Who has Custody? No data recorded Is CPS involved or ever been involved? Never  Is APS involved or ever been involved? Never  Patient Determined To Be At Risk for Harm To Self or Others Based on Review of Patient Reported Information or Presenting Complaint? Yes, for Self-Harm   Method: No data recorded  Availability of Means: No data recorded  Intent: No data recorded  Notification Required: No data recorded  Additional Information for Danger to Others Potential:  No  data recorded  Additional Comments for Danger to Others Potential:  No data recorded  Are There Guns or Other Weapons in [place]?  No data recorded   Types of Guns/Weapons: No data recorded   Are These Weapons Safely Secured?                              No data recorded   Who Could Verify You Are Able To Have These Secured:    No data recorded Do  You Have any Outstanding Charges, Pending Court Dates, Parole/Probation? No data recorded Contacted To Inform of Risk of Harm To Self or Others: No data recorded Location of Assessment: [facility] ED  Does Patient Present under Involuntary Commitment? Yes   IVC Papers Initial File Date: 01/29/20   [place] of Residence: [place]  Patient Currently Receiving the Following Services: Medication Management   Determination of Need: No data recorded  Options For Referral: No data recorded   Adaku Anike, NP, patient recommended for overnight observation.  Venora Maples, [facility]

## 2020-01-29 NOTE — ED Provider Notes (Addendum)
[facility] EMERGENCY DEPARTMENT Provider Note   CSN: [id number] Arrival date & time: 01/29/20  1403     History Chief Complaint  Patient presents with  . Suicidal    Michele Rich is a 34 y.o. female history of alcohol abuse, MDD.  Per triage note patient brought in by [employer] under IVC.  Patient with threat of suicide by overdose.  Apparently her counselor Blair Heys took out IVC paperwork.  History obtained by patient.  Patient reports that she was getting an argument with her mother today when she began to have a panic attack.  Patient asked her mother to give her hydroxyzine but mother refused.  Patient then reports she threatened to hurt herself in order to get her medications.  At this time patient denies SI/HI.  She reports that she is feeling well she has no complaints or concerns.  She denies hallucinations, injury, ingestion, chest pain, shortness of breath, abdominal pain, nausea/vomiting, numbness/weakness, tingling, SI/HI or any additional concerns. HPI     Past Medical History:  Diagnosis Date  . Alcoholism ([place])   . HPV in female   . Medical history non-contributory     Patient Active Problem List   Diagnosis Date Noted  . MDD (major depressive disorder) 01/04/2019  . Alcohol dependence with alcohol-induced mood disorder ([place]) 01/04/2019    Past Surgical History:  Procedure Laterality Date  . NO PAST SURGERIES       OB History    Gravida  3   Para  2   Term  2   Preterm      AB  1   Living  2     SAB      TAB      Ectopic      Multiple      Live Births              Family History  Family history unknown: Yes    Social History   Tobacco Use  . Smoking status: Current Every Day Smoker    Packs/day: 0.10    Types: Cigarettes  . Smokeless tobacco: Never Used  Vaping Use  . Vaping Use: Never used  Substance Use Topics  . Alcohol use: Yes    Comment: daily  . Drug use: No    Home Medications Prior  to Admission medications   Medication Sig Start Date End Date Taking? Authorizing Provider  chlordiazePOXIDE (LIBRIUM) 25 MG capsule 50mg  PO TID x 1D, then 25-50mg  PO BID X 1D, then 25-50mg  PO QD X 1D 01/15/20  Yes Truddie Hidden, MD  hydrOXYzine (ATARAX/VISTARIL) 25 MG tablet Take 1 tablet (25 mg total) by mouth every 6 (six) hours as needed for itching or anxiety. Patient taking differently: Take 25 mg by mouth 3 (three) times daily.  01/05/19  Yes Connye Burkitt, NP  OXcarbazepine (TRILEPTAL) 150 MG tablet Take 150 mg by mouth 2 (two) times daily. 01/01/20  Yes [provider]  traZODone (DESYREL) 50 MG tablet Take 50 mg by mouth at bedtime as needed (sleep).    Yes [provider]    Allergies    Apple fruit extract  Review of Systems   Review of Systems Ten systems are reviewed and are negative for acute change except as noted in the HPI  Physical Exam Updated Vital Signs BP 114/80   Pulse 81   Temp 98.4 F (36.9 C)   Resp 16   Ht 5\' 5"  (1.651 m)  Wt 81.6 kg   SpO2 98%   BMI 29.95 kg/m   Physical Exam Constitutional:      General: She is not in acute distress.    Appearance: Normal appearance. She is well-developed. She is not ill-appearing or diaphoretic.  HENT:     Head: Normocephalic and atraumatic.     Jaw: There is normal jaw occlusion.     Right Ear: External ear normal.     Left Ear: External ear normal.     Nose: Nose normal.  Eyes:     General: Vision grossly intact. Gaze aligned appropriately.     Extraocular Movements: Extraocular movements intact.     Pupils: Pupils are equal, round, and reactive to light.  Neck:     Trachea: Trachea and phonation normal. No tracheal tenderness or tracheal deviation.  Pulmonary:     Effort: Pulmonary effort is normal. No accessory muscle usage or respiratory distress.     Breath sounds: Normal air entry.  Chest:     Chest wall: No deformity, tenderness or crepitus.     Comments: No evidence of  injury Abdominal:     General: There is no distension.     Palpations: Abdomen is soft.     Tenderness: There is no abdominal tenderness. There is no guarding or rebound.     Comments: No evidence of injury  Musculoskeletal:        General: Normal range of motion.     Cervical back: Normal range of motion and neck supple.     Comments: No midline C/T/L spinal tenderness to palpation, no paraspinal muscle tenderness, no deformity, crepitus, or step-off noted. No sign of injury to the neck or back. - Moves extremities spontaneously x4.  All major joints mobilized with appropriate range of motion and strength without pain.  Feet:     Right foot:     Protective Sensation: 3 sites tested. 3 sites sensed.     Left foot:     Protective Sensation: 3 sites tested. 3 sites sensed.  Skin:    General: Skin is warm and dry.  Neurological:     Mental Status: She is alert.     GCS: GCS eye subscore is 4. GCS verbal subscore is 5. GCS motor subscore is 6.     Comments: Speech is clear and goal oriented, follows commands Major Cranial nerves without deficit, no facial droop Normal strength in upper and lower extremities bilaterally including dorsiflexion and plantar flexion, strong and equal grip strength Sensation normal to light and sharp touch Moves extremities without ataxia, coordination intact  Psychiatric:        Behavior: Behavior normal.    ED Results / Procedures / Treatments   Labs (all labs ordered are listed, but only abnormal results are displayed) Labs Reviewed  COMPREHENSIVE METABOLIC PANEL - Abnormal; Notable for the following components:      Result Value   Potassium 3.3 (*)    BUN 5 (*)    Calcium 8.7 (*)    AST 59 (*)    ALT 53 (*)    Anion gap 17 (*)    All other components within normal limits  ETHANOL - Abnormal; Notable for the following components:   Alcohol, Ethyl (B) 392 (*)    All other components within normal limits  SALICYLATE LEVEL - Abnormal; Notable for  the following components:   Salicylate Lvl <7.0 (*)    All other components within normal limits  ACETAMINOPHEN LEVEL - Abnormal; Notable  for the following components:   Acetaminophen (Tylenol), Serum <10 (*)    All other components within normal limits  CBC - Abnormal; Notable for the following components:   MCV 101.0 (*)    All other components within normal limits  RAPID URINE DRUG SCREEN, HOSP PERFORMED - Abnormal; Notable for the following components:   Benzodiazepines POSITIVE (*)    All other components within normal limits  SARS CORONAVIRUS 2 BY RT PCR (HOSPITAL ORDER, PERFORMED IN [facility])  MAGNESIUM  I-STAT BETA HCG BLOOD, ED ([facility], [facility], [facility] ONLY)    EKG None  Radiology No results found.  Procedures Procedures (including critical care time)  Medications Ordered in ED Medications  thiamine (B-1) injection 100 mg (has no administration in time range)  folic acid (FOLVITE) tablet 1 mg (has no administration in time range)  LORazepam (ATIVAN) injection 0-4 mg (has no administration in time range)    Or  LORazepam (ATIVAN) tablet 0-4 mg (has no administration in time range)  LORazepam (ATIVAN) injection 0-4 mg (has no administration in time range)    Or  LORazepam (ATIVAN) tablet 0-4 mg (has no administration in time range)  thiamine tablet 100 mg (has no administration in time range)    Or  thiamine (B-1) injection 100 mg (has no administration in time range)  potassium chloride SA (KLOR-CON) CR tablet 40 mEq (has no administration in time range)  sodium chloride 0.9 % bolus 1,000 mL (1,000 mLs Intravenous New Bag/Given 01/29/20 1703)    ED Course  I have reviewed the triage vital signs and the nursing notes.  Pertinent labs & imaging results that were available during my care of the patient were reviewed by me and considered in my medical decision making (see chart for details).    MDM Rules/Calculators/A&P                         I ordered,  reviewed and interpreted labs which include: CBC shows no leukocytosis to suggest infection and no evidence of anemia. Tylenol level negative Salicylate level negative Magnesium within normal limits Pregnancy test negative UDS positive for benzodiazepines CMP shows mild hypokalemia at 3.3, mild elevation of AST and ALT, mild gap of 17.  Suspect CMP abnormalities are secondary to her alcohol use and decreased p.o. intake.  Patient was given 1 L fluid bolus and p.o. potassium supplementation.  Patient given thiamine and folate.  Consult placed to TTS further evaluation.  Placed in psych hold on CIWA protocol.    At this time there does not appear to be any evidence of an acute emergency medical condition and the patient appears stable for psych hold on CIWA and TTS evaluation when patient is sober.  Discussed plan of care with Dr. Silverio Lay.  Note: Portions of this report may have been transcribed using voice recognition software. Every effort was made to ensure accuracy; however, inadvertent computerized transcription errors may still be present. Final Clinical Impression(s) / ED Diagnoses Final diagnoses:  Involuntary commitment  Alcoholic intoxication with complication [facility])    Rx / DC Orders ED Discharge Orders    None          Elizabeth Palau 01/29/20 1758    Charlynne Pander, MD 01/29/20 2228

## 2020-01-29 NOTE — ED Triage Notes (Signed)
Pt. Was brought in by [employer] with IVC pt had stated, I was going to kill myself with all my pills. The counselor on the phone took out the IVC papers .  Michele Rich is the counselor

## 2020-01-29 NOTE — ED Notes (Signed)
Dinner Tray Ordered @ [phone].

## 2020-01-29 NOTE — ED Notes (Signed)
Selena Batten, RN, will have IVC faxed to [facility].  Disposition pending receipt of IVC for additional information.

## 2020-01-30 NOTE — ED Notes (Signed)
Pt's dinner ordered °

## 2020-01-30 NOTE — ED Notes (Signed)
Pt's dinner arrived. °

## 2020-01-30 NOTE — ED Notes (Signed)
The  Pt  Has an elevated bp  She reports that her bp gets high every time she gets ativan  Which she was given at  1430

## 2020-01-30 NOTE — ED Notes (Signed)
Dinner ordered 

## 2020-01-30 NOTE — ED Notes (Signed)
Breakfast ordered 

## 2020-01-30 NOTE — ED Provider Notes (Signed)
Emergency Medicine Observation Re-evaluation Note  Michele Rich is a 34 y.o. female, seen on rounds today.  Pt initially presented to the ED for complaints of Suicidal (PER IVC, Patient is SI with plan to overdose. ) Currently, the patient is watching tv. She has no complaints at this time.   Physical Exam  BP (!) 134/100   Pulse 76   Temp 98.3 F (36.8 C) (Oral)   Resp 18   Ht 5\' 5"  (1.651 m)   Wt 81.6 kg   SpO2 98%   BMI 29.95 kg/m  Physical Exam  PE: Constitutional: well-developed, well-nourished, no apparent distress. Steady gait HENT: normocephalic, atraumatic Cardiovascular: normal rate and rhythm Pulmonary/Chest: effort normal; breath sounds clear and equal bilaterally; no wheezes or rales Abdominal: soft and nontender Musculoskeletal: full ROM, no edema Neurological: alert with goal directed thinking Skin: warm and dry, no rash, no diaphoresis Psychiatric: normal mood and affect, normal behavior    ED Course / MDM  EKG:    I have reviewed the labs performed to date as well as medications administered while in observation.  Recent changes in the last 24 hours include continued CIWA protocol. Ativan has   Plan  Current plan is for TTS evaluation to determine if patient is appropriate for [facility] placement. The nurse tells me she is reaching out to [facility] again now to see why patient has not yet had evaluation.  Patient is under full IVC at this time.  Patient does appear clinically sober. She is anxious and benefiting from Ativan with CIWA protocol.     Portions of this note were generated with [facility]. Dictation errors may occur despite best attempts at proofreading.    Scientist, clinical (histocompatibility and immunogenetics), PA-C 01/30/20 1506    02/01/20, MD 01/31/20 1013

## 2020-01-30 NOTE — ED Provider Notes (Signed)
Patient's RN informed me that her pressures have been elevated.  I reassessed patient.  She continues to be resting comfortably.  She has no headache, chest pain, blurry vision, numbness, weakness, tingling. Full neuro exam performed is normal, no focal weakness. She tells me when she has taken Ativan in the past it makes her blood pressure elevated.  She has never been officially diagnosed with hypertension.  At this time we will continue to closely monitor blood pressure.  No indications for emergent intervention currently.  Patient also informed me that she is feeling nauseous after eating dinner.  She has not had solid food in the last 4 days.  She was only consuming alcohol.  Abdominal exam is benign.  No tenderness or any peritoneal signs.  Discussed with patient she should probably try to eat bland foods and to inform nursing staff if nausea worsens or she develops any other symptoms.   Sherene Sires, PA-C 01/30/20 1844    Sabas Sous, MD 01/30/20 2342

## 2020-01-30 NOTE — ED Notes (Signed)
Pt metel detected by security.  

## 2020-01-30 NOTE — ED Notes (Signed)
Lunch Tray ordered 

## 2020-01-30 NOTE — ED Provider Notes (Signed)
IVC First Exam completed.   Dione Booze, MD 01/30/20 [phone]

## 2020-01-30 NOTE — ED Notes (Addendum)
Received IVC from [place], Char[employer] fundraiser.   Renaye Rakers, NP, recommends overnight observation.

## 2020-01-31 NOTE — Discharge Instructions (Signed)
Please follow-up with your regular doctors regarding your alcohol abuse.  If you have any thoughts of hurting herself, hurting others or other new concerning symptom, please return to ER for reassessment.

## 2020-01-31 NOTE — ED Notes (Signed)
Breakfast ordered 

## 2020-01-31 NOTE — Progress Notes (Signed)
Patient ID: Michele Rich, female   DOB: 1985/12/29, 34 y.o.   MRN: [id number]   Psychiatric reassessment   HPI: Patient presenting with SI with plan to overdose on pills. Patient was placed under IVC by mobile crisis worker. PER IVC, patient is a danger to self, has stated she has thoughts of killing herself by overdosing on prescription medications. Patient reported that she was in an argument with mother after mother would not give her panic medications. Patient is currently being seen by [facility], last seen 1 week ago. Patient reported worsening depressive symptoms.  Psychiatric evaluation: This is a 34 year old female who was transported to [facility] for concerns as noted above. During this evaluation, she was alert and oriented x4, calm and cooperative. She stated she was taken to the ED after her mother called the police. Prior to her mother Child psychotherapist, she reported that she, and her mother, had a disagreement stating," she made me made because of her mouth." She acknowledged that she made threats to take pills however reported that she had no intentions of harming herself. She added, " I only said it because she made me mad and I know how to get under her skin." She denied current SI, HI or psychosis. She denied prior suicide attempts or self-harming events, She denied prior psychiatric hospitalizations then later reported having been hospitalized for detox.  She denied alcohol abuse although her Ethanol was 392 on admission and she has had numerous other elevated ethanol levels per review of chart. She denied other drug use and her UDS was positive for benzodiazepines only. She denied current withdrawal symptoms. Denied access to firearms. Reported current outpatient psychiatric services are through [facility] where she is receiving both medication management and therapy. Reported that she lived with her mother ad gave verbal concent to speak to her mother for additional information.   I spoke  to patients mother, [name], [phone] who stated she received a phone call yesterday while at work from patients father. Stated, she was told by patients father, that patient had made threats to overdose on pills. Stated she got off work and went home and patient threatened to stab herself because she was upset after she took the bottle of pills. Stated that patient had been drinking alcohol at the time and that patient has a severe problem with alcohol abuse.  Stated that patients biggest issues are largely related to her alcohol addiction however, patient refuses to get help. Stated that patient has never treated to harm herself in the past nor others. Stated she believes that patient needs a long-term treatment center for her alcohol abuse.    Disposition: Patient denies SI, HI and psychosis. She minimizes her substance abuse issues and this seemed to be mothers concern. She has no prior suicide attempts or self-harming acts which was confirmed by mother. She and I discussed substance abuse treatment services and she declined however, these services would be beneficial. At this time, there is no evidence of imminent risk to self or others at present.  Patient does not meet criteria for psychiatric inpatient admission and is therefore, psychiatrically cleared. It was reccommended that she continue  follow-up outpatient psychiatric services with [facility]. Peer support consultation placed. Recommended for patient to participate in substance abuse treatment program through [facility] as she is an established patient.   ED nurse Magda Paganini updated on disposition.

## 2020-01-31 NOTE — ED Notes (Signed)
Patient verbalizes understanding of discharge instructions . Opportunity for questions and answers were provided . Armband removed by staff ,Pt discharged from ED. W/C  offered at D/C  and Declined W/C at D/C and was escorted to lobby by RN.  

## 2020-02-19 ENCOUNTER — Inpatient Hospital Stay (HOSPITAL_COMMUNITY)
Admission: EM | Admit: 2020-02-19 | Discharge: 2020-02-23 | DRG: 439 | Disposition: A | Discharge status: Home / Self Care | Payer: Medicaid Other | Attending: Family Medicine | Admitting: Family Medicine

## 2020-02-19 ENCOUNTER — Emergency Department (HOSPITAL_COMMUNITY): Payer: Medicaid Other

## 2020-02-19 DIAGNOSIS — A630 Anogenital (venereal) warts: Secondary | ICD-10-CM | POA: Diagnosis present

## 2020-02-19 DIAGNOSIS — R112 Nausea with vomiting, unspecified: Secondary | ICD-10-CM | POA: Diagnosis present

## 2020-02-19 DIAGNOSIS — E872 Acidosis: Secondary | ICD-10-CM | POA: Diagnosis present

## 2020-02-19 DIAGNOSIS — E8729 Other acidosis: Secondary | ICD-10-CM

## 2020-02-19 DIAGNOSIS — K2970 Gastritis, unspecified, without bleeding: Secondary | ICD-10-CM | POA: Diagnosis present

## 2020-02-19 DIAGNOSIS — E876 Hypokalemia: Secondary | ICD-10-CM | POA: Diagnosis not present

## 2020-02-19 DIAGNOSIS — K859 Acute pancreatitis without necrosis or infection, unspecified: Secondary | ICD-10-CM | POA: Diagnosis present

## 2020-02-19 DIAGNOSIS — E871 Hypo-osmolality and hyponatremia: Secondary | ICD-10-CM | POA: Diagnosis not present

## 2020-02-19 DIAGNOSIS — F10932 Alcohol use, unspecified with withdrawal with perceptual disturbance: Secondary | ICD-10-CM

## 2020-02-19 DIAGNOSIS — Z91018 Allergy to other foods: Secondary | ICD-10-CM

## 2020-02-19 DIAGNOSIS — Z20822 Contact with and (suspected) exposure to covid-19: Secondary | ICD-10-CM | POA: Diagnosis present

## 2020-02-19 DIAGNOSIS — F419 Anxiety disorder, unspecified: Secondary | ICD-10-CM | POA: Diagnosis present

## 2020-02-19 DIAGNOSIS — R10816 Epigastric abdominal tenderness: Secondary | ICD-10-CM | POA: Diagnosis present

## 2020-02-19 DIAGNOSIS — K852 Alcohol induced acute pancreatitis without necrosis or infection: Principal | ICD-10-CM | POA: Diagnosis present

## 2020-02-19 DIAGNOSIS — F10930 Alcohol use, unspecified with withdrawal, uncomplicated: Secondary | ICD-10-CM | POA: Diagnosis present

## 2020-02-19 DIAGNOSIS — K7010 Alcoholic hepatitis without ascites: Secondary | ICD-10-CM | POA: Diagnosis present

## 2020-02-19 DIAGNOSIS — F329 Major depressive disorder, single episode, unspecified: Secondary | ICD-10-CM | POA: Diagnosis present

## 2020-02-19 DIAGNOSIS — F10232 Alcohol dependence with withdrawal with perceptual disturbance: Secondary | ICD-10-CM | POA: Diagnosis present

## 2020-02-19 DIAGNOSIS — N179 Acute kidney failure, unspecified: Secondary | ICD-10-CM

## 2020-02-19 DIAGNOSIS — F17210 Nicotine dependence, cigarettes, uncomplicated: Secondary | ICD-10-CM | POA: Diagnosis present

## 2020-02-19 LAB — BASIC METABOLIC PANEL
Anion gap: 27 — ABNORMAL HIGH (ref 5–15)
BUN: 7 mg/dL (ref 6–20)
CO2: 9 mmol/L — ABNORMAL LOW (ref 22–32)
Calcium: 9.5 mg/dL (ref 8.9–10.3)
Chloride: 99 mmol/L (ref 98–111)
Creatinine, Ser: 1.28 mg/dL — ABNORMAL HIGH (ref 0.44–1.00)
GFR calc Af Amer: >60 mL/min (ref >60)
GFR calc non Af Amer: 55 mL/min — ABNORMAL LOW (ref >60)
Glucose, Bld: 101 mg/dL — ABNORMAL HIGH (ref 70–99)
Potassium: 3.8 mmol/L (ref 3.5–5.1)
Sodium: 135 mmol/L (ref 135–145)

## 2020-02-19 LAB — CBC
HCT: 46.5 % — ABNORMAL HIGH (ref 36.0–46.0)
Hemoglobin: 14.8 g/dL (ref 12.0–15.0)
MCH: 33.7 pg (ref 26.0–34.0)
MCHC: 31.8 g/dL (ref 30.0–36.0)
MCV: 105.9 fL — ABNORMAL HIGH (ref 80.0–100.0)
Platelets: 122 10*3/uL — ABNORMAL LOW (ref 150–400)
RBC: 4.39 MIL/uL (ref 3.87–5.11)
RDW: 14.3 % (ref 11.5–15.5)
WBC: 5.9 10*3/uL (ref 4.0–10.5)
nRBC: 0 % (ref 0.0–0.2)

## 2020-02-19 LAB — ETHANOL: Alcohol, Ethyl (B): <10 mg/dL (ref <10)

## 2020-02-19 LAB — TROPONIN I (HIGH SENSITIVITY)
Troponin I (High Sensitivity): 3 ng/L (ref <18)
Troponin I (High Sensitivity): 3 ng/L (ref <18)

## 2020-02-19 LAB — I-STAT BETA HCG BLOOD, ED (MC, WL, AP ONLY): I-stat hCG, quantitative: <5 m[IU]/mL (ref <5)

## 2020-02-19 MED ORDER — ONDANSETRON 4 MG PO TBDP
4.0000 mg | ORAL_TABLET | Freq: Once | ORAL | Status: AC
  Administered 2020-02-19: 4 mg via ORAL
  Filled 2020-02-19: qty 1

## 2020-02-19 MED ORDER — SODIUM CHLORIDE 0.9% FLUSH
3.0000 mL | Freq: Once | INTRAVENOUS | Status: AC
  Administered 2020-02-20: 3 mL via INTRAVENOUS

## 2020-02-19 NOTE — ED Triage Notes (Addendum)
Pt arrives via [facility] from home with c/o n/v x5 days, hx of etoh abuse, last drink was 3 days ago. Pt also endorses CP. Hx of alcohol withdraw, pt calm in nad at this time.  HR initially 140 with ems, pt given 500 NS and 4mg  zofran, HR down to 110, BP 131/99, RR 20, CBG 89, 100% on ra. A/ox4, resp e/u.

## 2020-02-20 ENCOUNTER — Observation Stay (HOSPITAL_COMMUNITY): Payer: Medicaid Other

## 2020-02-20 ENCOUNTER — Other Ambulatory Visit: Payer: Self-pay

## 2020-02-20 ENCOUNTER — Inpatient Hospital Stay (HOSPITAL_COMMUNITY): Payer: Medicaid Other

## 2020-02-20 ENCOUNTER — Encounter (HOSPITAL_COMMUNITY): Payer: Self-pay | Admitting: Internal Medicine

## 2020-02-20 DIAGNOSIS — F17210 Nicotine dependence, cigarettes, uncomplicated: Secondary | ICD-10-CM | POA: Diagnosis present

## 2020-02-20 DIAGNOSIS — K7010 Alcoholic hepatitis without ascites: Secondary | ICD-10-CM | POA: Diagnosis present

## 2020-02-20 DIAGNOSIS — F329 Major depressive disorder, single episode, unspecified: Secondary | ICD-10-CM | POA: Diagnosis present

## 2020-02-20 DIAGNOSIS — E871 Hypo-osmolality and hyponatremia: Secondary | ICD-10-CM | POA: Diagnosis not present

## 2020-02-20 DIAGNOSIS — F419 Anxiety disorder, unspecified: Secondary | ICD-10-CM | POA: Diagnosis present

## 2020-02-20 DIAGNOSIS — Z91018 Allergy to other foods: Secondary | ICD-10-CM | POA: Diagnosis not present

## 2020-02-20 DIAGNOSIS — F10930 Alcohol use, unspecified with withdrawal, uncomplicated: Secondary | ICD-10-CM | POA: Diagnosis present

## 2020-02-20 DIAGNOSIS — R10816 Epigastric abdominal tenderness: Secondary | ICD-10-CM

## 2020-02-20 DIAGNOSIS — A630 Anogenital (venereal) warts: Secondary | ICD-10-CM | POA: Diagnosis present

## 2020-02-20 DIAGNOSIS — E872 Acidosis: Secondary | ICD-10-CM | POA: Diagnosis present

## 2020-02-20 DIAGNOSIS — F10230 Alcohol dependence with withdrawal, uncomplicated: Secondary | ICD-10-CM

## 2020-02-20 DIAGNOSIS — Z20822 Contact with and (suspected) exposure to covid-19: Secondary | ICD-10-CM | POA: Diagnosis present

## 2020-02-20 DIAGNOSIS — K852 Alcohol induced acute pancreatitis without necrosis or infection: Secondary | ICD-10-CM | POA: Diagnosis present

## 2020-02-20 DIAGNOSIS — K859 Acute pancreatitis without necrosis or infection, unspecified: Secondary | ICD-10-CM | POA: Diagnosis present

## 2020-02-20 DIAGNOSIS — R112 Nausea with vomiting, unspecified: Secondary | ICD-10-CM | POA: Diagnosis not present

## 2020-02-20 DIAGNOSIS — F10232 Alcohol dependence with withdrawal with perceptual disturbance: Secondary | ICD-10-CM | POA: Diagnosis present

## 2020-02-20 DIAGNOSIS — E876 Hypokalemia: Secondary | ICD-10-CM | POA: Diagnosis not present

## 2020-02-20 DIAGNOSIS — K2970 Gastritis, unspecified, without bleeding: Secondary | ICD-10-CM | POA: Diagnosis present

## 2020-02-20 DIAGNOSIS — N179 Acute kidney failure, unspecified: Secondary | ICD-10-CM | POA: Diagnosis present

## 2020-02-20 LAB — HEPATIC FUNCTION PANEL
ALT: 107 U/L — ABNORMAL HIGH (ref 0–44)
AST: 124 U/L — ABNORMAL HIGH (ref 15–41)
Albumin: 4.7 g/dL (ref 3.5–5.0)
Alkaline Phosphatase: 76 U/L (ref 38–126)
Bilirubin, Direct: 0.3 mg/dL — ABNORMAL HIGH (ref 0.0–0.2)
Indirect Bilirubin: 2.3 mg/dL — ABNORMAL HIGH (ref 0.3–0.9)
Total Bilirubin: 2.6 mg/dL — ABNORMAL HIGH (ref 0.3–1.2)
Total Protein: 8.3 g/dL — ABNORMAL HIGH (ref 6.5–8.1)

## 2020-02-20 LAB — RENAL FUNCTION PANEL
Albumin: 4.6 g/dL (ref 3.5–5.0)
Anion gap: 20 — ABNORMAL HIGH (ref 5–15)
BUN: 7 mg/dL (ref 6–20)
CO2: 11 mmol/L — ABNORMAL LOW (ref 22–32)
Calcium: 9.1 mg/dL (ref 8.9–10.3)
Chloride: 102 mmol/L (ref 98–111)
Creatinine, Ser: 1.39 mg/dL — ABNORMAL HIGH (ref 0.44–1.00)
GFR calc Af Amer: 58 mL/min — ABNORMAL LOW (ref >60)
GFR calc non Af Amer: 50 mL/min — ABNORMAL LOW (ref >60)
Glucose, Bld: 231 mg/dL — ABNORMAL HIGH (ref 70–99)
Phosphorus: <1 mg/dL — CL (ref 2.5–4.6)
Potassium: 4.1 mmol/L (ref 3.5–5.1)
Sodium: 133 mmol/L — ABNORMAL LOW (ref 135–145)

## 2020-02-20 LAB — URINALYSIS, ROUTINE W REFLEX MICROSCOPIC
Bilirubin Urine: NEGATIVE
Glucose, UA: NEGATIVE mg/dL
Ketones, ur: 80 mg/dL — AB
Leukocytes,Ua: NEGATIVE
Nitrite: NEGATIVE
Protein, ur: >=300 mg/dL — AB
Specific Gravity, Urine: 1.024 (ref 1.005–1.030)
pH: 6 (ref 5.0–8.0)

## 2020-02-20 LAB — CBG MONITORING, ED
Glucose-Capillary: 124 mg/dL — ABNORMAL HIGH (ref 70–99)
Glucose-Capillary: 142 mg/dL — ABNORMAL HIGH (ref 70–99)
Glucose-Capillary: 147 mg/dL — ABNORMAL HIGH (ref 70–99)
Glucose-Capillary: 155 mg/dL — ABNORMAL HIGH (ref 70–99)
Glucose-Capillary: 160 mg/dL — ABNORMAL HIGH (ref 70–99)
Glucose-Capillary: 169 mg/dL — ABNORMAL HIGH (ref 70–99)
Glucose-Capillary: 171 mg/dL — ABNORMAL HIGH (ref 70–99)
Glucose-Capillary: 176 mg/dL — ABNORMAL HIGH (ref 70–99)
Glucose-Capillary: 185 mg/dL — ABNORMAL HIGH (ref 70–99)
Glucose-Capillary: 205 mg/dL — ABNORMAL HIGH (ref 70–99)

## 2020-02-20 LAB — BASIC METABOLIC PANEL
Anion gap: 17 — ABNORMAL HIGH (ref 5–15)
Anion gap: 12 (ref 5–15)
BUN: <5 mg/dL — ABNORMAL LOW (ref 6–20)
BUN: <5 mg/dL — ABNORMAL LOW (ref 6–20)
CO2: 13 mmol/L — ABNORMAL LOW (ref 22–32)
CO2: 16 mmol/L — ABNORMAL LOW (ref 22–32)
Calcium: 8.8 mg/dL — ABNORMAL LOW (ref 8.9–10.3)
Calcium: 8.3 mg/dL — ABNORMAL LOW (ref 8.9–10.3)
Chloride: 103 mmol/L (ref 98–111)
Chloride: 103 mmol/L (ref 98–111)
Creatinine, Ser: 1.08 mg/dL — ABNORMAL HIGH (ref 0.44–1.00)
Creatinine, Ser: 0.86 mg/dL (ref 0.44–1.00)
GFR calc Af Amer: >60 mL/min (ref >60)
GFR calc Af Amer: >60 mL/min (ref >60)
GFR calc non Af Amer: >60 mL/min (ref >60)
GFR calc non Af Amer: >60 mL/min (ref >60)
Glucose, Bld: 185 mg/dL — ABNORMAL HIGH (ref 70–99)
Glucose, Bld: 200 mg/dL — ABNORMAL HIGH (ref 70–99)
Potassium: 3.8 mmol/L (ref 3.5–5.1)
Potassium: 3.4 mmol/L — ABNORMAL LOW (ref 3.5–5.1)
Sodium: 133 mmol/L — ABNORMAL LOW (ref 135–145)
Sodium: 131 mmol/L — ABNORMAL LOW (ref 135–145)

## 2020-02-20 LAB — CBC WITH DIFFERENTIAL/PLATELET
Abs Immature Granulocytes: 0.03 10*3/uL (ref 0.00–0.07)
Basophils Absolute: 0 10*3/uL (ref 0.0–0.1)
Basophils Relative: 0 %
Eosinophils Absolute: 0.1 10*3/uL (ref 0.0–0.5)
Eosinophils Relative: 1 %
HCT: 44.6 % (ref 36.0–46.0)
Hemoglobin: 14.7 g/dL (ref 12.0–15.0)
Immature Granulocytes: 0 %
Lymphocytes Relative: 7 %
Lymphs Abs: 0.5 10*3/uL — ABNORMAL LOW (ref 0.7–4.0)
MCH: 32.8 pg (ref 26.0–34.0)
MCHC: 33 g/dL (ref 30.0–36.0)
MCV: 99.6 fL (ref 80.0–100.0)
Monocytes Absolute: 0.6 10*3/uL (ref 0.1–1.0)
Monocytes Relative: 7 %
Neutro Abs: 6.3 10*3/uL (ref 1.7–7.7)
Neutrophils Relative %: 85 %
Platelets: 130 10*3/uL — ABNORMAL LOW (ref 150–400)
RBC: 4.48 MIL/uL (ref 3.87–5.11)
RDW: 14 % (ref 11.5–15.5)
WBC: 7.5 10*3/uL (ref 4.0–10.5)
nRBC: 0 % (ref 0.0–0.2)

## 2020-02-20 LAB — LACTIC ACID, PLASMA: Lactic Acid, Venous: 1.7 mmol/L (ref 0.5–1.9)

## 2020-02-20 LAB — BETA-HYDROXYBUTYRIC ACID: Beta-Hydroxybutyric Acid: >8 mmol/L — ABNORMAL HIGH (ref 0.05–0.27)

## 2020-02-20 LAB — CK: Total CK: 54 U/L (ref 38–234)

## 2020-02-20 LAB — HEMOGLOBIN A1C
Hgb A1c MFr Bld: 4.5 % — ABNORMAL LOW (ref 4.8–5.6)
Mean Plasma Glucose: 82.45 mg/dL

## 2020-02-20 LAB — LIPASE, BLOOD: Lipase: 801 U/L — ABNORMAL HIGH (ref 11–51)

## 2020-02-20 LAB — HIV ANTIBODY (ROUTINE TESTING W REFLEX): HIV Screen 4th Generation wRfx: NONREACTIVE

## 2020-02-20 LAB — MAGNESIUM
Magnesium: 2.1 mg/dL (ref 1.7–2.4)
Magnesium: 2.8 mg/dL — ABNORMAL HIGH (ref 1.7–2.4)

## 2020-02-20 LAB — SARS CORONAVIRUS 2 BY RT PCR (HOSPITAL ORDER, PERFORMED IN ~~LOC~~ HOSPITAL LAB): SARS Coronavirus 2: NEGATIVE

## 2020-02-20 MED ORDER — LORAZEPAM 2 MG/ML IJ SOLN
0.0000 mg | Freq: Four times a day (QID) | INTRAMUSCULAR | Status: DC
  Administered 2020-02-20: 1 mg via INTRAVENOUS
  Filled 2020-02-20: qty 1

## 2020-02-20 MED ORDER — LORAZEPAM 1 MG PO TABS
1.0000 mg | ORAL_TABLET | ORAL | Status: DC | PRN
  Administered 2020-02-22: 1 mg via ORAL
  Filled 2020-02-20: qty 1

## 2020-02-20 MED ORDER — LACTATED RINGERS IV BOLUS
1000.0000 mL | Freq: Once | INTRAVENOUS | Status: AC
  Administered 2020-02-20: 1000 mL via INTRAVENOUS

## 2020-02-20 MED ORDER — THIAMINE HCL 100 MG/ML IJ SOLN
100.0000 mg | Freq: Once | INTRAMUSCULAR | Status: AC
  Administered 2020-02-20: 100 mg via INTRAVENOUS
  Filled 2020-02-20: qty 2

## 2020-02-20 MED ORDER — LORAZEPAM 2 MG/ML IJ SOLN
0.0000 mg | Freq: Two times a day (BID) | INTRAMUSCULAR | Status: DC
Start: 2020-02-22 — End: 2020-02-20

## 2020-02-20 MED ORDER — LORAZEPAM 2 MG/ML IJ SOLN
0.0000 mg | Freq: Two times a day (BID) | INTRAMUSCULAR | Status: DC
  Administered 2020-02-23: 2 mg via INTRAVENOUS
  Filled 2020-02-20 (×2): qty 1

## 2020-02-20 MED ORDER — ONDANSETRON HCL 4 MG/2ML IJ SOLN
4.0000 mg | Freq: Four times a day (QID) | INTRAMUSCULAR | Status: DC | PRN
  Administered 2020-02-21 (×3): 4 mg via INTRAVENOUS
  Filled 2020-02-20 (×3): qty 2

## 2020-02-20 MED ORDER — ACETAMINOPHEN 325 MG PO TABS
650.0000 mg | ORAL_TABLET | Freq: Four times a day (QID) | ORAL | Status: DC | PRN
  Administered 2020-02-22 – 2020-02-23 (×2): 650 mg via ORAL
  Filled 2020-02-20 (×2): qty 2

## 2020-02-20 MED ORDER — ADULT MULTIVITAMIN W/MINERALS CH
1.0000 | ORAL_TABLET | Freq: Every day | ORAL | Status: DC
  Administered 2020-02-21 – 2020-02-23 (×3): 1 via ORAL
  Filled 2020-02-20 (×3): qty 1

## 2020-02-20 MED ORDER — POLYETHYLENE GLYCOL 3350 17 G PO PACK: 17.0000 g | PACK | Freq: Every day | ORAL | Status: DC | PRN

## 2020-02-20 MED ORDER — LORAZEPAM 2 MG/ML IJ SOLN: 0.0000 mg | Freq: Four times a day (QID) | INTRAMUSCULAR | Status: DC

## 2020-02-20 MED ORDER — LORAZEPAM 1 MG PO TABS
0.0000 mg | ORAL_TABLET | Freq: Two times a day (BID) | ORAL | Status: DC
  Administered 2020-02-23: 1 mg via ORAL
  Filled 2020-02-20: qty 1

## 2020-02-20 MED ORDER — PANTOPRAZOLE SODIUM 20 MG PO TBEC
20.0000 mg | DELAYED_RELEASE_TABLET | Freq: Every day | ORAL | Status: DC
  Administered 2020-02-20 – 2020-02-23 (×4): 20 mg via ORAL
  Filled 2020-02-20 (×4): qty 1

## 2020-02-20 MED ORDER — IOHEXOL 350 MG/ML SOLN
100.0000 mL | Freq: Once | INTRAVENOUS | Status: AC | PRN
  Administered 2020-02-20: 100 mL via INTRAVENOUS

## 2020-02-20 MED ORDER — HYDROMORPHONE HCL 1 MG/ML IJ SOLN
0.5000 mg | INTRAMUSCULAR | Status: DC | PRN
  Administered 2020-02-20 – 2020-02-23 (×7): 0.5 mg via INTRAVENOUS
  Filled 2020-02-20 (×2): qty 0.5
  Filled 2020-02-20: qty 1
  Filled 2020-02-20 (×5): qty 0.5

## 2020-02-20 MED ORDER — PANTOPRAZOLE SODIUM 40 MG IV SOLR
40.0000 mg | Freq: Two times a day (BID) | INTRAVENOUS | Status: DC
  Administered 2020-02-20: 40 mg via INTRAVENOUS
  Filled 2020-02-20: qty 40

## 2020-02-20 MED ORDER — HYDROMORPHONE HCL 1 MG/ML IJ SOLN: 1.0000 mg | INTRAMUSCULAR | Status: DC | PRN

## 2020-02-20 MED ORDER — LACTATED RINGERS IV SOLN: INTRAVENOUS | Status: DC

## 2020-02-20 MED ORDER — DEXTROSE-NACL 5-0.45 % IV SOLN: INTRAVENOUS | Status: DC

## 2020-02-20 MED ORDER — ACETAMINOPHEN 650 MG RE SUPP: 650.0000 mg | Freq: Four times a day (QID) | RECTAL | Status: DC | PRN

## 2020-02-20 MED ORDER — POTASSIUM CHLORIDE 2 MEQ/ML IV SOLN
Freq: Once | INTRAVENOUS | Status: AC
  Filled 2020-02-20: qty 1000

## 2020-02-20 MED ORDER — POTASSIUM PHOSPHATES 15 MMOLE/5ML IV SOLN
30.0000 mmol | Freq: Once | INTRAVENOUS | Status: AC
  Administered 2020-02-20: 30 mmol via INTRAVENOUS
  Filled 2020-02-20 (×2): qty 10

## 2020-02-20 MED ORDER — LACTATED RINGERS IV SOLN: INTRAVENOUS | Status: AC

## 2020-02-20 MED ORDER — THIAMINE HCL 100 MG/ML IJ SOLN: 100.0000 mg | Freq: Every day | INTRAMUSCULAR | Status: DC

## 2020-02-20 MED ORDER — SODIUM CHLORIDE 0.9% FLUSH
10.0000 mL | Freq: Two times a day (BID) | INTRAVENOUS | Status: DC
  Administered 2020-02-22 – 2020-02-23 (×3): 10 mL

## 2020-02-20 MED ORDER — ENOXAPARIN SODIUM 40 MG/0.4ML ~~LOC~~ SOLN
40.0000 mg | SUBCUTANEOUS | Status: DC
  Filled 2020-02-20 (×5): qty 0.4

## 2020-02-20 MED ORDER — SODIUM CHLORIDE 0.9 % IV SOLN: INTRAVENOUS | Status: DC

## 2020-02-20 MED ORDER — LORAZEPAM 2 MG/ML IJ SOLN
1.0000 mg | INTRAMUSCULAR | Status: DC | PRN
  Administered 2020-02-21 – 2020-02-22 (×4): 2 mg via INTRAVENOUS
  Filled 2020-02-20 (×3): qty 1

## 2020-02-20 MED ORDER — FOLIC ACID 1 MG PO TABS
1.0000 mg | ORAL_TABLET | Freq: Every day | ORAL | Status: DC
  Administered 2020-02-21 – 2020-02-23 (×3): 1 mg via ORAL
  Filled 2020-02-20 (×3): qty 1

## 2020-02-20 MED ORDER — INSULIN REGULAR(HUMAN) IN NACL 100-0.9 UT/100ML-% IV SOLN
INTRAVENOUS | Status: DC
  Administered 2020-02-20: 1.3 [IU]/h via INTRAVENOUS
  Administered 2020-02-21: 0.2 [IU]/h via INTRAVENOUS
  Administered 2020-02-21: 0.5 [IU]/h via INTRAVENOUS
  Filled 2020-02-20: qty 100

## 2020-02-20 MED ORDER — DEXTROSE 50 % IV SOLN: 0.0000 mL | INTRAVENOUS | Status: DC | PRN

## 2020-02-20 MED ORDER — SODIUM CHLORIDE 0.9% FLUSH: 10.0000 mL | INTRAVENOUS | Status: DC | PRN

## 2020-02-20 MED ORDER — THIAMINE HCL 100 MG PO TABS
100.0000 mg | ORAL_TABLET | Freq: Every day | ORAL | Status: DC
  Administered 2020-02-21 – 2020-02-23 (×3): 100 mg via ORAL
  Filled 2020-02-20 (×3): qty 1

## 2020-02-20 MED ORDER — ONDANSETRON HCL 4 MG PO TABS: 4.0000 mg | ORAL_TABLET | Freq: Four times a day (QID) | ORAL | Status: DC | PRN

## 2020-02-20 MED ORDER — DEXTROSE IN LACTATED RINGERS 5 % IV SOLN: INTRAVENOUS | Status: DC

## 2020-02-20 MED ORDER — LORAZEPAM 1 MG PO TABS: 0.0000 mg | ORAL_TABLET | Freq: Four times a day (QID) | ORAL | Status: DC

## 2020-02-20 NOTE — Progress Notes (Signed)
Inpatient Diabetes Program Recommendations  AACE/ADA: New Consensus Statement on Inpatient Glycemic Control (2015)  Target Ranges:  Prepandial:   less than 140 mg/dL      Peak postprandial:   less than 180 mg/dL (1-2 hours)      Critically ill patients:  140 - 180 mg/dL   Lab Results  Component Value Date   GLUCAP 160 (H) 02/20/2020   HGBA1C 5.1 01/05/2019    Review of Glycemic Control  Diabetes history: No hx DM Current orders for Inpatient glycemic control: IV insulin  Inpatient Diabetes Program Recommendations:   Received consult regarding management evaluation. Agree with current treatment plan.  Will follow during hospitalization.  Thank you, Billy Fischer. Damaree Sargent, RN, MSN, CDE  Diabetes Coordinator Inpatient Glycemic Control Team Team Pager [phone] (8am-5pm) 02/20/2020 12:28 PM

## 2020-02-20 NOTE — Progress Notes (Signed)
PROGRESS NOTE    Michele Rich  PPI:[id number] DOB: 07-05-1986 DOA: 02/19/2020 PCP: Patient, No Pcp Per      Brief Narrative:  Michele Rich is a 34 y.o. F with alcohol use disorder who presents with chest/epigastric pain and vomiting for 3 days.  In the ER, CT showed pancreatitis, labs showed DKA.       Assessment & Plan:  Acute pancreatitis Patient presented with epigastric pain, vomiting, lipase 800, and pancreatic edema on CT.  History of heavy alcohol use (up to Rich a gallon liquor every few days).  -N.p.o. -Aggressive IV fluids -Ondansetron for nausea, Dilaudid for pain  Transaminitis Likely alcoholic.  RUQ US shows no gallstone.  Ketoacidosis No prior history diabetes, but sugars here in low 200s and has obvious AGMA with BHB >8. -Continue IV fluids with dextrose -Continue insulin drip   Unclear if diabetes is pre-existing.  Her ketoacidosis may all be alcoholic.  I will err on side of insulin drip with dextrose fluids as her parameters are improving on this therapy this afternoon.    Alcohol use disorder with withdrawal No prior history of DTs or seizures.  History of mild withdrawal in the past.  However, does report drinking up to 1/2 gallon liquor every one to two days during binges. -Continue thiamine and folate -Continue CIWA scoring with on demand Ativan  Gastritis -Continue pantoprazole      Disposition: Status is: Observation  The patient will require care spanning > 2 midnights and should be moved to inpatient because: Hemodynamically unstable  Dispo: The patient is from: Home              Anticipated d/c is to: Home              Anticipated d/c date is: > 3 days              Patient currently is not medically stable to d/c.              MDM: This is a no charge note.  For further details, please see H&P by my partner Michele Rich from earlier today.  The below labs and imaging reports were reviewed and summarized above.     DVT prophylaxis: enoxaparin (LOVENOX) injection 40 mg Start: 02/20/20 1800  Code Status: FULL Family Communication:     Consultants:     Procedures:     Antimicrobials:      Culture data:              Subjective: Feeling weak, abdominal pain persists.  No confusion.        Objective: Vitals:   02/20/20 1859 02/20/20 1900 02/20/20 1945 02/20/20 2000  BP:  119/82 112/78 127/87  Pulse: (!) 111 (!) 111    Resp:  19 19 15   Temp:      TempSrc:      SpO2:  100%    Weight:      Height:        Intake/Output Summary (Last 24 hours) at 02/20/2020 2012 Last data filed at 02/20/2020 1237 Gross per 24 hour  Intake 2100 ml  Output --  Net 2100 ml   Filed Weights   02/20/20 0510  Weight: 72.6 kg    Examination: The patient was seen and examined.      Data Reviewed: I have personally reviewed following labs and imaging studies:  CBC: Recent Labs  Lab 02/19/20 1847 02/20/20 1030  WBC 5.9 7.5  NEUTROABS  --  6.3  HGB 14.8 14.7  HCT 46.5* 44.6  MCV 105.9* 99.6  PLT 122* 130*   Basic Metabolic Panel: Recent Labs  Lab 02/19/20 1847 02/19/20 2205 02/20/20 1030 02/20/20 1638  NA 135  --  133* 133*  K 3.8  --  4.1 3.8  CL 99  --  102 103  CO2 9*  --  11* 13*  GLUCOSE 101*  --  231* 185*  BUN 7  --  7 <5*  CREATININE 1.28*  --  1.39* 1.08*  CALCIUM 9.5  --  9.1 8.8*  MG  --  2.1 2.8*  --   PHOS  --   --  <1.0*  --    GFR: Estimated Creatinine Clearance: 74.6 mL/min (A) (by C-G formula based on SCr of 1.08 mg/dL (H)). Liver Function Tests: Recent Labs  Lab 02/20/20 1030  AST 124*  ALT 107*  ALKPHOS 76  BILITOT 2.6*  PROT 8.3*  ALBUMIN 4.7  4.6   Recent Labs  Lab 02/20/20 1030  LIPASE 801*   No results for input(s): AMMONIA in the last 168 hours. Coagulation Profile: No results for input(s): INR, PROTIME in the last 168 hours. Cardiac Enzymes: Recent Labs  Lab 02/20/20 1030  CKTOTAL 54   BNP (last 3 results) No  results for input(s): PROBNP in the last 8760 hours. HbA1C: Recent Labs    02/20/20 0949  HGBA1C 4.5*   CBG: Recent Labs  Lab 02/20/20 1416 02/20/20 1602 02/20/20 1731 02/20/20 1856 02/20/20 1958  GLUCAP 155* 176* 169* 205* 185*   Lipid Profile: No results for input(s): CHOL, HDL, LDLCALC, TRIG, CHOLHDL, LDLDIRECT in the last 72 hours. Thyroid Function Tests: No results for input(s): TSH, T4TOTAL, FREET4, T3FREE, THYROIDAB in the last 72 hours. Anemia Panel: No results for input(s): VITAMINB12, FOLATE, FERRITIN, TIBC, IRON, RETICCTPCT in the last 72 hours. Urine analysis:    Component Value Date/Time   COLORURINE YELLOW 02/20/2020 0713   APPEARANCEUR HAZY (A) 02/20/2020 0713   LABSPEC 1.024 02/20/2020 0713   PHURINE 6.0 02/20/2020 0713   GLUCOSEU NEGATIVE 02/20/2020 0713   HGBUR MODERATE (A) 02/20/2020 0713   BILIRUBINUR NEGATIVE 02/20/2020 0713   KETONESUR 80 (A) 02/20/2020 0713   PROTEINUR >=300 (A) 02/20/2020 0713   UROBILINOGEN 0.2 06/10/2015 1150   NITRITE NEGATIVE 02/20/2020 0713   LEUKOCYTESUR NEGATIVE 02/20/2020 0713   Sepsis Labs: @LABRCNTIP (procalcitonin:4,lacticacidven:4)  ) Recent Results (from the past 240 hour(s))  SARS Coronavirus 2 by RT PCR (hospital order, [facility]) Nasopharyngeal Nasopharyngeal Swab     Status: None   Collection Time: 02/20/20  5:29 AM   Specimen: Nasopharyngeal Swab  Result Value Ref Range Status   SARS Coronavirus 2 NEGATIVE NEGATIVE Final    Comment: (NOTE) SARS-CoV-2 target nucleic acids are NOT DETECTED.  The SARS-CoV-2 RNA is generally detectable in upper and lower respiratory specimens during the acute phase of infection. The lowest concentration of SARS-CoV-2 viral copies this assay can detect is 250 copies / mL. A negative result does not preclude SARS-CoV-2 infection and should not be used as the sole basis for treatment or other patient management decisions.  A negative result may  occur with improper specimen collection / handling, submission of specimen other than nasopharyngeal swab, presence of viral mutation(s) within the areas targeted by this assay, and inadequate number of viral copies (<250 copies / mL). A negative result must be combined with clinical observations, patient history, and epidemiological information.  Fact Sheet for Patients:  BoilerBrush.com.cy  Fact Sheet for Healthcare Providers: https://pope.com/  This test is not yet approved or  cleared by the [place] FDA and has been authorized for detection and/or diagnosis of SARS-CoV-2 by FDA under an Emergency Use Authorization (EUA).  This EUA will remain in effect (meaning this test can be used) for the duration of the COVID-19 declaration under Section 564(b)(1) of the Act, 21 U.S.C. section 360bbb-3(b)(1), unless the authorization is terminated or revoked sooner.  Performed at [facility], [address] [place], [place] [address]          Radiology Studies: DG Chest 2 View  Result Date: 02/19/2020 CLINICAL DATA:  34 year old female with chest pain. EXAM: CHEST - 2 VIEW COMPARISON:  Chest radiograph dated 08/15/2019. FINDINGS: The lungs are clear. There is no pleural effusion pneumothorax. The cardiac silhouette is within normal limits. No acute osseous pathology. IMPRESSION: No active cardiopulmonary disease. Electronically Signed   By: Elgie Collard M.D.   On: 02/19/2020 19:32   US Abdomen Complete  Result Date: 02/20/2020 CLINICAL DATA:  Alcohol abuse, pancreatitis, abdominal pain EXAM: ABDOMEN ULTRASOUND COMPLETE COMPARISON:  None. FINDINGS: Gallbladder: No gallstones or wall thickening visualized. No sonographic Murphy sign noted by sonographer. Common bile duct: Diameter: 3 mm in proximal diameter Liver: The hepatic parenchyma is diffusely echogenic and there is coarsening of the hepatic echotexture in keeping with  changes of at least moderate hepatic steatosis. No focal intrahepatic masses are seen. There is no intrahepatic biliary ductal dilation. Portal vein is patent on color Doppler imaging with normal direction of blood flow towards the liver. IVC: No abnormality visualized. Pancreas: Visualized portion unremarkable. Spleen: Size and appearance within normal limits. Right Kidney: Length: 7.1 x 5.6 x 11.1 cm. Echogenicity within normal limits. No mass or hydronephrosis visualized. Left Kidney: Length: 7.0 x 11.8 cm. Echogenicity within normal limits. No mass or hydronephrosis visualized. Abdominal aorta: No aneurysm visualized. Other findings: None. IMPRESSION: Moderate hepatic steatosis. Electronically Signed   By: Helyn Numbers MD   On: 02/20/2020 14:35   CT ABDOMEN PELVIS W CONTRAST  Result Date: 02/20/2020 CLINICAL DATA:  Acute nonlocalized abdominal pain. EXAM: CT ABDOMEN AND PELVIS WITH CONTRAST TECHNIQUE: Multidetector CT imaging of the abdomen and pelvis was performed using the standard protocol following bolus administration of intravenous contrast. CONTRAST:  OMNIPAQUE IOHEXOL 350 MG/ML SOLN COMPARISON:  None. FINDINGS: Lower chest:  No contributory findings. Hepatobiliary: Hepatic steatosis.No evidence of biliary obstruction or stone. Pancreas: Mild peripancreatic edema at the tail and head. No necrosis, collection, or ductal dilatation. Spleen: Unremarkable. Adrenals/Urinary Tract: Negative adrenals. No hydronephrosis or stone. Unremarkable bladder. Stomach/Bowel:  No obstruction. No appendicitis. Vascular/Lymphatic: No acute vascular abnormality. No mass or adenopathy. Reproductive:No pathologic findings. Other: No ascites or pneumoperitoneum. Musculoskeletal: No acute abnormalities. Bony excrescence superiorly from the medial left iliac crest with osteochondroma features, measuring up to 3.6 cm in transverse span. No mass effect on adjacent structures. IMPRESSION: 1. Mild acute pancreatitis. 2.  Prominent hepatic steatosis. 3. 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 Electronically Signed   By: Marnee Spring M.D.   On: 02/20/2020 07:45        Scheduled Meds: . enoxaparin (LOVENOX) injection  40 mg Subcutaneous Q24H  . [START ON 02/21/2020] folic acid  1 mg Oral Daily  . [START ON 02/22/2020] LORazepam  0-4 mg Intravenous Q12H   Or  . [START ON 02/22/2020] LORazepam  0-4 mg Oral Q12H  . [START ON 02/21/2020] multivitamin with minerals  1  tablet Oral Daily  . pantoprazole (PROTONIX) IV  40 mg Intravenous Q12H  . sodium chloride flush  10-40 mL Intracatheter Q12H  . [START ON 02/21/2020] thiamine  100 mg Oral Daily   Or  . [START ON 02/21/2020] thiamine  100 mg Intravenous Daily   Continuous Infusions: . dextrose 5% lactated ringers 75 mL/hr at 02/20/20 1241  . insulin 1.6 Units/hr (02/20/20 1959)  . lactated ringers Stopped (02/20/20 1612)  . potassium PHOSPHATE IVPB (in mmol) 30 mmol (02/20/20 1430)     LOS: 0 days    Time spent: 20 minutes    Alberteen Sam, MD [facility] 02/20/2020, 8:12 PM     Please page though AMION or Epic secure chat:  For password, contact charge nurse

## 2020-02-20 NOTE — ED Notes (Signed)
Off floor to US.

## 2020-02-20 NOTE — ED Notes (Signed)
Date and time results received: 02/20/20    Test: Phosphorus Critical Value: <1  Name of Provider Notified: Danford MD (Messaged.)  Orders Received? Or Actions Taken?:

## 2020-02-20 NOTE — ED Provider Notes (Addendum)
[facility] EMERGENCY DEPARTMENT Provider Note   CSN: [id number] Arrival date & time: 02/19/20  1839     History Chief Complaint  Patient presents with  . Emesis  . Chest Pain    Michele Rich is a 34 y.o. female with a history of alcohol use disorder, depression, and anxiety who presents the emergency department by EMS with a chief complaint of vomiting.  The patient reports that she has had no appetite for the last 6 days and thought it was after spending the day out in the sun at a water park and eating a piece of pizza that had been left out at room temperature for 2 days.  Approximately 4 days ago, she began having NBNB vomiting.  States that she has been unable to keep down food or any fluids since her symptoms began.  She also reports that her last alcohol use was 4 days ago.  She reports associated diffuse, cramping, abdominal pain that has been constant with headaches, chest tightness, and shortness of breath.  She reports that previously when she has developed hallucinations from alcohol withdrawal that she would see nets.  She reports that she was seeing nets earlier tonight while she was waiting in the ER.  She denies auditory hallucinations, SI, HI, vaginal bleeding or discharge, dysuria, hematuria, cough, loss of sense of taste or smell, rash, syncope, palpitations.  She has a history of alcohol withdrawal, but has no history of seizures.  She is a current, every day smoker.  Patient states that she is not sure that she would like to quit drinking, but states that she has not had any alcohol use because she has been unable to keep any fluids down due to vomiting.  She reports that she was drinking half of a gallon of vodka daily until she was seen in the ER last month when she was IVC'ed for suicidal ideation with a plan to overdose.  Since that time she states that she will drink "a shot and a beer", but she is unsure how many total drinks that she has been  drinking daily.  The history is provided by the patient. No language interpreter was used.       Past Medical History:  Diagnosis Date  . Alcoholism (HCC)   . HPV in female     Patient Active Problem List   Diagnosis Date Noted  . Alcohol withdrawal syndrome without complication (HCC) 02/20/2020  . Intractable nausea and vomiting 02/20/2020  . Epigastric abdominal tenderness without rebound tenderness 02/20/2020  . Pancreatitis 02/20/2020  . MDD (major depressive disorder) 01/04/2019  . Alcohol dependence with alcohol-induced mood disorder (HCC) 01/04/2019    Past Surgical History:  Procedure Laterality Date  . NO PAST SURGERIES       OB History    Gravida  3   Para  2   Term  2   Preterm      AB  1   Living  2     SAB      TAB      Ectopic      Multiple      Live Births              Family History  Family history unknown: Yes    Social History   Tobacco Use  . Smoking status: Current Every Day Smoker    Packs/day: 0.10    Types: Cigarettes  . Smokeless tobacco: Never Used  Vaping Use  .  Vaping Use: Never used  Substance Use Topics  . Alcohol use: Yes    Comment: daily  . Drug use: No    Home Medications Prior to Admission medications   Medication Sig Start Date End Date Taking? Authorizing Provider  folic acid (FOLVITE) 1 MG tablet Take 1 tablet (1 mg total) by mouth daily. 02/24/20   Danford, Earl Lites, MD  gabapentin (NEURONTIN) 100 MG capsule Take 1 capsule (100 mg total) by mouth 3 (three) times daily. 02/23/20   Danford, Earl Lites, MD  ondansetron (ZOFRAN ODT) 4 MG disintegrating tablet Take 1 tablet (4 mg total) by mouth every 8 (eight) hours as needed for nausea or vomiting. 03/10/20   Harlene Salts A, PA-C  potassium chloride (KLOR-CON) 10 MEQ tablet Take 1 tablet (10 mEq total) by mouth at bedtime. 02/23/20   Danford, Earl Lites, MD  thiamine 100 MG tablet Take 1 tablet (100 mg total) by mouth daily. 02/24/20    Danford, Earl Lites, MD    Allergies    Apple fruit extract  Review of Systems   Review of Systems  Constitutional: Negative for activity change, chills, diaphoresis and fever.  HENT: Negative for congestion and sore throat.   Respiratory: Positive for shortness of breath. Negative for cough.   Cardiovascular: Positive for chest pain.  Gastrointestinal: Positive for abdominal pain, nausea and vomiting. Negative for constipation.  Genitourinary: Negative for dysuria, frequency, vaginal bleeding and vaginal pain.  Musculoskeletal: Negative for back pain, myalgias, neck pain and neck stiffness.  Skin: Negative for rash.  Allergic/Immunologic: Negative for immunocompromised state.  Neurological: Negative for dizziness, seizures, syncope, weakness, numbness and headaches.  Psychiatric/Behavioral: Negative for confusion.    Physical Exam Updated Vital Signs BP 112/76 (BP Location: Right Arm)   Pulse 93   Temp 98.4 F (36.9 C) (Oral)   Resp 18   Ht  (1.651 m)   Wt 72.6 kg   SpO2 100%   BMI 26.63 kg/m   Physical Exam Vitals and nursing note reviewed.  Constitutional:      General: She is not in acute distress.    Appearance: She is not toxic-appearing or diaphoretic.  HENT:     Head: Normocephalic.     Mouth/Throat:     Mouth: Mucous membranes are dry.  Eyes:     Conjunctiva/sclera: Conjunctivae normal.  Cardiovascular:     Rate and Rhythm: Regular rhythm. Tachycardia present.     Heart sounds: No murmur heard.  No friction rub. No gallop.   Pulmonary:     Effort: Pulmonary effort is normal. No respiratory distress.     Breath sounds: No stridor. No wheezing, rhonchi or rales.  Chest:     Chest wall: No tenderness.  Abdominal:     General: There is no distension.     Palpations: Abdomen is soft. There is no mass.     Tenderness: There is abdominal tenderness. There is no right CVA tenderness, left CVA tenderness, guarding or rebound.     Hernia: No hernia is  present.     Comments: Mild epigastric tenderness.  No rebound or guarding.  Abdomen is soft and nondistended.  Normoactive bowel sounds.  No CVA tenderness bilaterally.  Negative Murphy sign.  No tenderness over McBurney's point.  Musculoskeletal:     Cervical back: Neck supple.     Right lower leg: No edema.     Left lower leg: No edema.  Skin:    General: Skin is warm.  Capillary Refill: Capillary refill takes more than 3 seconds.     Findings: No rash.  Neurological:     General: No focal deficit present.     Mental Status: She is alert.     Comments: A&O x4  Psychiatric:        Attention and Perception: She perceives visual hallucinations.        Behavior: Behavior normal.        Thought Content: Thought content does not include homicidal or suicidal ideation. Thought content does not include homicidal or suicidal plan.     ED Results / Procedures / Treatments   Labs (all labs ordered are listed, but only abnormal results are displayed) Labs Reviewed  BASIC METABOLIC PANEL - Abnormal; Notable for the following components:      Result Value   CO2 9 (*)    Glucose, Bld 101 (*)    Creatinine, Ser 1.28 (*)    GFR calc non Af Amer 55 (*)    Anion gap 27 (*)    All other components within normal limits  CBC - Abnormal; Notable for the following components:   HCT 46.5 (*)    MCV 105.9 (*)    Platelets 122 (*)    All other components within normal limits  URINALYSIS, ROUTINE W REFLEX MICROSCOPIC - Abnormal; Notable for the following components:   APPearance HAZY (*)    Hgb urine dipstick MODERATE (*)    Ketones, ur 80 (*)    Protein, ur >=300 (*)    Bacteria, UA RARE (*)    All other components within normal limits  HEPATIC FUNCTION PANEL - Abnormal; Notable for the following components:   Total Protein 8.3 (*)    AST 124 (*)    ALT 107 (*)    Total Bilirubin 2.6 (*)    Bilirubin, Direct 0.3 (*)    Indirect Bilirubin 2.3 (*)    All other components within normal  limits  CBC WITH DIFFERENTIAL/PLATELET - Abnormal; Notable for the following components:   Platelets 130 (*)    Lymphs Abs 0.5 (*)    All other components within normal limits  RENAL FUNCTION PANEL - Abnormal; Notable for the following components:   Sodium 133 (*)    CO2 11 (*)    Glucose, Bld 231 (*)    Creatinine, Ser 1.39 (*)    Phosphorus <1.0 (*)    GFR calc non Af Amer 50 (*)    GFR calc Af Amer 58 (*)    Anion gap 20 (*)    All other components within normal limits  MAGNESIUM - Abnormal; Notable for the following components:   Magnesium 2.8 (*)    All other components within normal limits  LIPASE, BLOOD - Abnormal; Notable for the following components:   Lipase 801 (*)    All other components within normal limits  BASIC METABOLIC PANEL - Abnormal; Notable for the following components:   Sodium 133 (*)    CO2 13 (*)    Glucose, Bld 185 (*)    BUN <5 (*)    Creatinine, Ser 1.08 (*)    Calcium 8.8 (*)    Anion gap 17 (*)    All other components within normal limits  BASIC METABOLIC PANEL - Abnormal; Notable for the following components:   Sodium 131 (*)    Potassium 3.4 (*)    CO2 16 (*)    Glucose, Bld 200 (*)    BUN <5 (*)  Calcium 8.3 (*)    All other components within normal limits  BASIC METABOLIC PANEL - Abnormal; Notable for the following components:   Sodium 130 (*)    Potassium 3.2 (*)    CO2 16 (*)    Glucose, Bld 154 (*)    BUN <5 (*)    Calcium 8.6 (*)    All other components within normal limits  BETA-HYDROXYBUTYRIC ACID - Abnormal; Notable for the following components:   Beta-Hydroxybutyric Acid >8.00 (*)    All other components within normal limits  HEMOGLOBIN A1C - Abnormal; Notable for the following components:   Hgb A1c MFr Bld 4.5 (*)    All other components within normal limits  CBC WITH DIFFERENTIAL/PLATELET - Abnormal; Notable for the following components:   Platelets 109 (*)    All other components within normal limits  COMPREHENSIVE  METABOLIC PANEL - Abnormal; Notable for the following components:   Sodium 132 (*)    Potassium 2.9 (*)    CO2 20 (*)    Glucose, Bld 129 (*)    BUN <5 (*)    AST 57 (*)    ALT 68 (*)    Total Bilirubin 1.6 (*)    All other components within normal limits  PHOSPHORUS - Abnormal; Notable for the following components:   Phosphorus 1.1 (*)    All other components within normal limits  BASIC METABOLIC PANEL - Abnormal; Notable for the following components:   Sodium 131 (*)    Potassium 2.9 (*)    CO2 21 (*)    Glucose, Bld 139 (*)    BUN <5 (*)    All other components within normal limits  BETA-HYDROXYBUTYRIC ACID - Abnormal; Notable for the following components:   Beta-Hydroxybutyric Acid 2.49 (*)    All other components within normal limits  BETA-HYDROXYBUTYRIC ACID - Abnormal; Notable for the following components:   Beta-Hydroxybutyric Acid 1.59 (*)    All other components within normal limits  GLUCOSE, CAPILLARY - Abnormal; Notable for the following components:   Glucose-Capillary 135 (*)    All other components within normal limits  GLUCOSE, CAPILLARY - Abnormal; Notable for the following components:   Glucose-Capillary 123 (*)    All other components within normal limits  GLUCOSE, CAPILLARY - Abnormal; Notable for the following components:   Glucose-Capillary 127 (*)    All other components within normal limits  GLUCOSE, CAPILLARY - Abnormal; Notable for the following components:   Glucose-Capillary 121 (*)    All other components within normal limits  GLUCOSE, CAPILLARY - Abnormal; Notable for the following components:   Glucose-Capillary 114 (*)    All other components within normal limits  GLUCOSE, CAPILLARY - Abnormal; Notable for the following components:   Glucose-Capillary 116 (*)    All other components within normal limits  GLUCOSE, CAPILLARY - Abnormal; Notable for the following components:   Glucose-Capillary 128 (*)    All other components within normal  limits  GLUCOSE, CAPILLARY - Abnormal; Notable for the following components:   Glucose-Capillary 105 (*)    All other components within normal limits  GLUCOSE, CAPILLARY - Abnormal; Notable for the following components:   Glucose-Capillary 108 (*)    All other components within normal limits  COMPREHENSIVE METABOLIC PANEL - Abnormal; Notable for the following components:   Sodium 131 (*)    Potassium 2.9 (*)    Glucose, Bld 138 (*)    BUN <5 (*)    Calcium 8.8 (*)    Total  Protein 6.0 (*)    AST 54 (*)    ALT 59 (*)    All other components within normal limits  CBC - Abnormal; Notable for the following components:   RBC 3.44 (*)    Hemoglobin 11.6 (*)    HCT 33.1 (*)    Platelets 89 (*)    All other components within normal limits  GLUCOSE, CAPILLARY - Abnormal; Notable for the following components:   Glucose-Capillary 106 (*)    All other components within normal limits  GLUCOSE, CAPILLARY - Abnormal; Notable for the following components:   Glucose-Capillary 155 (*)    All other components within normal limits  CBC - Abnormal; Notable for the following components:   WBC 3.4 (*)    RBC 3.15 (*)    Hemoglobin 10.4 (*)    HCT 30.2 (*)    Platelets 95 (*)    All other components within normal limits  COMPREHENSIVE METABOLIC PANEL - Abnormal; Notable for the following components:   Sodium 134 (*)    Potassium 2.8 (*)    BUN <5 (*)    Total Protein 5.8 (*)    Albumin 3.3 (*)    AST 49 (*)    ALT 52 (*)    All other components within normal limits  MAGNESIUM - Abnormal; Notable for the following components:   Magnesium 1.5 (*)    All other components within normal limits  PHOSPHORUS - Abnormal; Notable for the following components:   Phosphorus 1.9 (*)    All other components within normal limits  GLUCOSE, CAPILLARY - Abnormal; Notable for the following components:   Glucose-Capillary 145 (*)    All other components within normal limits  CBC - Abnormal; Notable for the  following components:   RBC 3.57 (*)    HCT 35.3 (*)    Platelets 131 (*)    All other components within normal limits  COMPREHENSIVE METABOLIC PANEL - Abnormal; Notable for the following components:   Potassium 2.7 (*)    Glucose, Bld 115 (*)    BUN <5 (*)    AST 70 (*)    ALT 64 (*)    All other components within normal limits  GLUCOSE, CAPILLARY - Abnormal; Notable for the following components:   Glucose-Capillary 167 (*)    All other components within normal limits  GLUCOSE, CAPILLARY - Abnormal; Notable for the following components:   Glucose-Capillary 112 (*)    All other components within normal limits  CBG MONITORING, ED - Abnormal; Notable for the following components:   Glucose-Capillary 160 (*)    All other components within normal limits  CBG MONITORING, ED - Abnormal; Notable for the following components:   Glucose-Capillary 155 (*)    All other components within normal limits  CBG MONITORING, ED - Abnormal; Notable for the following components:   Glucose-Capillary 176 (*)    All other components within normal limits  CBG MONITORING, ED - Abnormal; Notable for the following components:   Glucose-Capillary 169 (*)    All other components within normal limits  CBG MONITORING, ED - Abnormal; Notable for the following components:   Glucose-Capillary 205 (*)    All other components within normal limits  CBG MONITORING, ED - Abnormal; Notable for the following components:   Glucose-Capillary 185 (*)    All other components within normal limits  CBG MONITORING, ED - Abnormal; Notable for the following components:   Glucose-Capillary 171 (*)    All other components within normal  limits  CBG MONITORING, ED - Abnormal; Notable for the following components:   Glucose-Capillary 124 (*)    All other components within normal limits  CBG MONITORING, ED - Abnormal; Notable for the following components:   Glucose-Capillary 147 (*)    All other components within normal limits    CBG MONITORING, ED - Abnormal; Notable for the following components:   Glucose-Capillary 142 (*)    All other components within normal limits  SARS CORONAVIRUS 2 BY RT PCR (HOSPITAL ORDER, PERFORMED IN [facility])  ETHANOL  MAGNESIUM  LACTIC ACID, PLASMA  CK  HIV ANTIBODY (ROUTINE TESTING W REFLEX)  MAGNESIUM  GLUCOSE, CAPILLARY  GLUCOSE, CAPILLARY  GLUCOSE, CAPILLARY  GLUCOSE, CAPILLARY  MAGNESIUM  PHOSPHORUS  POTASSIUM  I-STAT BETA HCG BLOOD, ED ([facility], [facility], [facility] ONLY)  TROPONIN I (HIGH SENSITIVITY)  TROPONIN I (HIGH SENSITIVITY)    EKG EKG Interpretation  Date/Time:  Wednesday February 20 2020 05:02:53 EDT Ventricular Rate:  139 PR Interval:  124 QRS Duration: 89 QT Interval:  281 QTC Calculation: 428 R Axis:   83 Text Interpretation: Sinus tachycardia Consider right atrial enlargement Borderline repolarization abnormality Confirmed by Ross Marcus ([id number]) on 02/20/2020 5:15:17 AM Also confirmed by Ross Marcus ([id number]), editor Erenest Rasher [phone])  on 02/21/2020 7:33:56 AM   Radiology No results found.  Procedures .Critical Care Performed by: Barkley Boards, PA-C Authorized by: Barkley Boards, PA-C   Critical care provider statement:    Critical care time (minutes):  40   Critical care time was exclusive of:  Separately billable procedures and treating other patients and teaching time   Critical care was necessary to treat or prevent imminent or life-threatening deterioration of the following conditions:  Metabolic crisis   Critical care was time spent personally by me on the following activities:  Ordering and performing treatments and interventions, ordering and review of laboratory studies, pulse oximetry, re-evaluation of patient's condition, review of old charts, obtaining history from patient or surrogate, evaluation of patient's response to treatment, development of treatment plan with patient or surrogate and examination of patient   I assumed  direction of critical care for this patient from another provider in my specialty: no     (including critical care time)  Medications Ordered in ED Medications  lactated ringers infusion ( Intravenous Stopped 02/20/20 1612)  potassium chloride SA (KLOR-CON) CR tablet 40 mEq (40 mEq Oral Not Given 02/21/20 2128)  sodium chloride flush (NS) 0.9 % injection 3 mL (3 mLs Intravenous Given 02/20/20 0531)  ondansetron (ZOFRAN-ODT) disintegrating tablet 4 mg (4 mg Oral Given 02/19/20 2216)  dextrose 5 % and 0.45% NaCl 1,000 mL with folic acid 1 mg, multivitamins adult 10 mL, potassium chloride 20 mEq, magnesium sulfate 2 g infusion ( Intravenous Stopped 02/20/20 1108)  thiamine (B-1) injection 100 mg (100 mg Intravenous Given 02/20/20 0530)  lactated ringers bolus 1,000 mL (0 mLs Intravenous Stopped 02/20/20 1237)  iohexol (OMNIPAQUE) 350 MG/ML injection 100 mL (100 mLs Intravenous Contrast Given 02/20/20 0714)  lactated ringers bolus 1,000 mL (0 mLs Intravenous Stopped 02/20/20 1237)  potassium PHOSPHATE 30 mmol in dextrose 5 % 500 mL infusion (0 mmol Intravenous Stopped 02/20/20 2126)  potassium PHOSPHATE 30 mmol in dextrose 5 % 500 mL infusion (30 mmol Intravenous New Bag/Given 02/21/20 1409)  potassium PHOSPHATE 30 mmol in dextrose 5 % 500 mL infusion (0 mmol Intravenous Stopped 02/23/20 0700)  magnesium sulfate IVPB 4 g 100 mL (0 g Intravenous Stopped 02/22/20 1638)  potassium chloride SA (KLOR-CON) CR tablet 40 mEq (40 mEq Oral Given 02/22/20 2257)  potassium chloride 10 mEq in 100 mL IVPB (0 mEq Intravenous Stopped 02/23/20 1618)  potassium chloride SA (KLOR-CON) CR tablet 40 mEq (40 mEq Oral Given 02/23/20 1137)    ED Course  I have reviewed the triage vital signs and the nursing notes.  Pertinent labs & imaging results that were available during my care of the patient were reviewed by me and considered in my medical decision making (see chart for details).  Clinical Course as of Mar 25 301  Wed Feb 20, 2020    0626 Alcohol, Ethyl (B): <10 [MM]  0626 CO2(!): 9 [MM]  0626 Anion gap(!): 27 [MM]  0626 Creatinine(!): 1.28 [MM]    Clinical Course User Index [MM] Ayomide Zuleta, Coral Else, PA-C   MDM Rules/Calculators/A&P                          34 year old female with a history of alcohol use disorder, depression, and anxiety presenting with anorexia for the last 6 days accompanied by nausea, vomiting for 4 days with abdominal pain, chest pain, shortness of breath.  Patient drinks alcohol daily and has not had any alcohol for the last 4 days.  She was endorsing visual hallucinations in the waiting room.   Tachycardic in the 150s on my evaluation.  Normotensive with mild tachypnea, but no hypoxia.  On exam, she appears volume depleted.  Abdominal exam is benign.  She has a wide anion gap metabolic acidosis with a bicarb of 9 and anion gap of 27.  Suspect alcoholic ketoacidosis.  She is mentating appropriately and has no evidence of encephalopathy.  Glucose is not elevated and doubt DKA.  Doubt pancreatitis with normal lipase.  Patient does not meet sepsis criteria.  Lower suspicion for toxic alcohol ingestion, GI bleed, Boerhaave's syndrome, alcoholic hepatitis, or alcohol induced gastritis.   She was also endorsing chest pain.  A sinus rhythm on EKG.  Chest x-ray is unremarkable.  Delta troponin is flat.  Hear score is 0.  Although she is tachycardic, I have a low suspicion for PE as I suspect her tachycardia and dyspnea are related to AKA.   We will start the patient on dextrose 5% and half-normal saline bolus with thiamine, magnesium sulfate, potassium chloride, and folic acid.  She is also been started on CIWA protocol.  Initial CIWA score is 9. UA is pending.   Given her metabolic derangements, she will require admission for further work-up and evaluation.  Patient has been discussed with Dr. Wilkie Aye, attending physician. Consulted the hospitalist team and Dr. Leafy Half has accepted the patient for admission.  The patient appears reasonably stabilized for admission considering the current resources, flow, and capabilities available in the ED at this time, and I doubt any other [facility] requiring further screening and/or treatment in the ED prior to admission.   Final Clinical Impression(s) / ED Diagnoses Final diagnoses:  Alcoholic ketoacidosis  AKI (acute kidney injury) (HCC)  Alcohol withdrawal syndrome with perceptual disturbance (HCC)    Rx / DC Orders ED Discharge Orders         Ordered    folic acid (FOLVITE) 1 MG tablet  Daily     Discontinue  Reprint     02/23/20 1715    thiamine 100 MG tablet  Daily     Discontinue  Reprint     02/23/20 1715    gabapentin (NEURONTIN) 100  MG capsule  3 times daily     Discontinue  Reprint     02/23/20 1715    potassium chloride (KLOR-CON) 10 MEQ tablet  Daily at bedtime     Discontinue  Reprint     02/23/20 1715    Increase activity slowly     Discontinue     02/23/20 1715    Discharge instructions     Discontinue    Comments: From Dr. Maryfrances Bunnell: You were admitted for pancreatitis from alcohol. You should advance your diet slowly.   Eat a BRAT diet: bananas, rice, applesauce, toast and other "easy" foods (pudding, mashed potatoes, crackers).  Nothing greasy for fatty.   You can slowly advance this back to normal after a few weeks   For the alcohol withdrawal, to reduce the symptoms of withdrawal that are likely to last for a few weeks or months: Take gabapentin 100 mg three times adily  Take this for two months and then taper off. (In other words, when you have about 6 pills left, then reduce to twice daily for 2 days then once daily for 2 days then stop)  Also, thiamine and folate are vitamins that your body loses when you drink alcohol Take thiamine and folate for 3 months  Take potassium 10 mEq at night before bed for 2 weeks  Follow up with a new primary care doctor Have them check your labs at the follow up appointment Tell them you need  labs checked  Follow up with a substance use treatment center   02/23/20 1715           Julianna Vanwagner, Coral Else, PA-C 02/20/20 0627    Shon Baton, MD 02/20/20 1610  ADDENDUM: Critical care time has been added.     Frederik Pear A, PA-C 03/25/20 0303    Shon Baton, MD 03/25/20 [phone]

## 2020-02-20 NOTE — H&P (Signed)
History and Physical    Michele Rich MAY:[id number] DOB: 15-Mar-1986 DOA: 02/19/2020  PCP: Patient, No Pcp Per  Patient coming from: home   Chief Complaint:   Nausea and vomiting, epigastric pain  HPI:  34 year old female with past medical history of alcoholism, major depressive disorder who presents to [facility] emergency department complaints of nausea and vomiting.  Patient explains that approximately 5 days ago she began to experience severe nausea.  This was associated with frequent bouts of nonbilious nonbloody vomiting as well as epigastric abdominal pain.  Patient describes his abdominal pain as moderate to severe in intensity, radiating in a bandlike distribution and sore in quality.  Patient denies any fevers, dysuria, low back pain, sick contacts, recent travel or confirmed contact with COVID-19 infection.  Patient attributes her symptoms to [place] 24 hours prior to the onset of her symptoms.  Patient reports that she typically drinks alcohol daily.  Drink up to half a gallon daily of vodka in the past but states that in the past several weeks she has only been drinking for shots of liquor daily along with several beers which she describes as "cutting back."  Patient states that since she began experiencing frequent bouts of vomiting she has been unable to tolerate any alcohol intake.    As the patient's symptoms have persisted in the past 2 to 3 days the patient has now been developing tremulousness and anxiousness.  Denies any associated hallucinations or seizure activity  Patient has continued to vomit upwards of 15 times daily and now has presented to [facility] emergency department for evaluation.  Upon evaluation in the emergency department, patient has been found to be tachycardic, tachypneic and tremulous concerning for active alcohol withdrawal.  Hospitalist group has been called to assess the patient for  admission the hospital for alcohol withdrawal and evaluation of intractable nausea and vomiting.   Review of Systems: A 10-system review of systems has been performed and all systems are negative with the exception of what is listed in the HPI.    Past Medical History:  Diagnosis Date  . Alcoholism (HCC)   . HPV in female     Past Surgical History:  Procedure Laterality Date  . NO PAST SURGERIES       reports that she has been smoking cigarettes. She has been smoking about 0.10 packs per day. She has never used smokeless tobacco. She reports current alcohol use. She reports that she does not use drugs.  Allergies  Allergen Reactions  . Apple Fruit Extract Hives    Only applesauce, pt can eat apples    Family History  Family history unknown: Yes     Prior to Admission medications   Medication Sig Start Date End Date Taking? Authorizing Provider  diphenhydrAMINE (BENADRYL) 25 MG tablet Take 25-50 mg by mouth every 6 (six) hours as needed for sleep.   Yes [provider]  chlordiazePOXIDE (LIBRIUM) 25 MG capsule 50mg  PO TID x 1D, then 25-50mg  PO BID X 1D, then 25-50mg  PO QD X 1D Patient not taking: Reported on 02/20/2020 01/15/20   03/16/20, MD  hydrOXYzine (ATARAX/VISTARIL) 25 MG tablet Take 1 tablet (25 mg total) by mouth every 6 (six) hours as needed for itching or anxiety. Patient not taking: Reported on 02/20/2020 01/05/19   01/07/19, NP    Physical Exam: Vitals:   02/20/20 0422 02/20/20 0510 02/20/20 0513 02/20/20 0530  BP: 04/22/20)  135/95  (!) 126/113 (!) 135/123  Pulse: (!) 125  (!) 134 (!) 133  Resp: 17   (!) 25  Temp: 98.7 F (37.1 C)     TempSrc: Oral     SpO2: 100%   100%  Weight:  72.6 kg    Height:  5\' 5"  (1.651 m)      Constitutional: Acute alert and oriented x3, patient is in distress due to abdominal pain and nausea. Skin: no rashes, no lesions, notable poor skin turgor. Eyes: Pupils are equally reactive to light.  No evidence of  scleral icterus or conjunctival pallor.  ENMT: Dry mucous membranes noted.  Posterior pharynx clear of any exudate or lesions.   Neck: normal, supple, no masses, no thyromegaly.  No evidence of jugular venous distension.   Respiratory: clear to auscultation bilaterally, no wheezing, no crackles. Normal respiratory effort. No accessory muscle use.  Cardiovascular: Tachycardic rate with regular rhythm, no murmurs / rubs / gallops. No extremity edema. 2+ pedal pulses. No carotid bruits.  Chest:   Nontender without crepitus or deformity.   Back:   Nontender without crepitus or deformity. Abdomen: Severe epigastric and right upper quadrant tenderness.  No evidence of intra-abdominal masses.  Positive bowel sounds noted in all quadrants.   Musculoskeletal: No joint deformity upper and lower extremities. Good ROM, no contractures. Normal muscle tone.  Neurologic: Patient is notably tremulous at rest.  CN 2-12 grossly intact. Sensation intact, strength noted to be 5 out of 5 in all 4 extremities.  Patient is following all commands.  Patient is responsive to verbal stimuli.   Psychiatric: Patient exhibits an anxious mood with labile affect.  Denies hallucinations.  Patient seems to possess insight as to theircurrent situation.     Labs on Admission: I have personally reviewed following labs and imaging studies -   CBC: Recent Labs  Lab 02/19/20 1847  WBC 5.9  HGB 14.8  HCT 46.5*  MCV 105.9*  PLT 122*   Basic Metabolic Panel: Recent Labs  Lab 02/19/20 1847 02/19/20 2205  NA 135  --   K 3.8  --   CL 99  --   CO2 9*  --   GLUCOSE 101*  --   BUN 7  --   CREATININE 1.28*  --   CALCIUM 9.5  --   MG  --  2.1   GFR: Estimated Creatinine Clearance: 63 mL/min (A) (by C-G formula based on SCr of 1.28 mg/dL (H)). Liver Function Tests: No results for input(s): AST, ALT, ALKPHOS, BILITOT, PROT, ALBUMIN in the last 168 hours. No results for input(s): LIPASE, AMYLASE in the last 168 hours. No  results for input(s): AMMONIA in the last 168 hours. Coagulation Profile: No results for input(s): INR, PROTIME in the last 168 hours. Cardiac Enzymes: No results for input(s): CKTOTAL, CKMB, CKMBINDEX, TROPONINI in the last 168 hours. BNP (last 3 results) No results for input(s): PROBNP in the last 8760 hours. HbA1C: No results for input(s): HGBA1C in the last 72 hours. CBG: No results for input(s): GLUCAP in the last 168 hours. Lipid Profile: No results for input(s): CHOL, HDL, LDLCALC, TRIG, CHOLHDL, LDLDIRECT in the last 72 hours. Thyroid Function Tests: No results for input(s): TSH, T4TOTAL, FREET4, T3FREE, THYROIDAB in the last 72 hours. Anemia Panel: No results for input(s): VITAMINB12, FOLATE, FERRITIN, TIBC, IRON, RETICCTPCT in the last 72 hours. Urine analysis:    Component Value Date/Time   COLORURINE AMBER (A) 01/15/2020 1545   APPEARANCEUR HAZY (A) 01/15/2020  1545   LABSPEC 1.028 01/15/2020 1545   PHURINE 5.0 01/15/2020 1545   GLUCOSEU 50 (A) 01/15/2020 1545   HGBUR SMALL (A) 01/15/2020 1545   BILIRUBINUR NEGATIVE 01/15/2020 1545   KETONESUR 20 (A) 01/15/2020 1545   PROTEINUR >=300 (A) 01/15/2020 1545   UROBILINOGEN 0.2 06/10/2015 1150   NITRITE NEGATIVE 01/15/2020 1545   LEUKOCYTESUR NEGATIVE 01/15/2020 1545    Radiological Exams on Admission - Personally Reviewed: DG Chest 2 View  Result Date: 02/19/2020 CLINICAL DATA:  34 year old female with chest pain. EXAM: CHEST - 2 VIEW COMPARISON:  Chest radiograph dated 08/15/2019. FINDINGS: The lungs are clear. There is no pleural effusion pneumothorax. The cardiac silhouette is within normal limits. No acute osseous pathology. IMPRESSION: No active cardiopulmonary disease. Electronically Signed   By: Elgie Collard M.D.   On: 02/19/2020 19:32    EKG: Personally reviewed.  Rhythm is sinus tachycardia with heart rate of 139 bpm.  No dynamic ST segment changes appreciated.  Assessment/Plan Principal Problem:    Alcohol withdrawal syndrome without complication [facility])   Patient presenting with tachycardia, tremulousness and anxiety in the setting of not being able to tolerate her usual alcohol consumption for the past 4 days.  Concern for alcohol withdrawal with eventual progression to delirium tremens  Placing patient in stepdown unit for close clinical monitoring.  She had a CIWA protocol with both scheduled and as needed benzodiazepines  Hydrating patient aggressively with intravenous isotonic fluids  Performing serial chemistries to evaluate for electrolyte abnormalities which will be promptly replaced.  Active Problems:   Intractable nausea and vomiting   Intractable nausea vomiting for the past several days with associated epigastric tenderness  While patient may be suffering from a acute gastroenteritis secondary to pizza as the patient suspects, I am also concerned about other potential etiologies including pancreatitis and hepatitis and gastric outlet obstruction.  Obtaining CT imaging of the abdomen pelvis to evaluate for any evidence of gastric outlet obstruction or pancreatitis.  Attending lipase and hepatic function panel  Obtaining urinalysis  Providing patient with as needed antiemetics  Clear liquid diet, advance as tolerated.    Epigastric abdominal tenderness without rebound tenderness  Please see assessment and plan above.    Code Status:  Full code Family Communication: deferred    Status is: Observation  The patient remains OBS appropriate and will d/c before 2 midnights.  Dispo: The patient is from: Home              Anticipated d/c is to: Home              Anticipated d/c date is: 2 days              Patient currently is not medically stable to d/c.        Marinda Elk MD [facility] Pager [phone]  If 7PM-7AM, please contact night-coverage [email or web address] Use universal [place] password for that web site. If you do not have  the password, please call the hospital operator.  02/20/2020, 6:52 AM

## 2020-02-20 NOTE — ED Notes (Signed)
Walked patient to the bathroom patient did well 

## 2020-02-21 LAB — COMPREHENSIVE METABOLIC PANEL
ALT: 68 U/L — ABNORMAL HIGH (ref 0–44)
ALT: 59 U/L — ABNORMAL HIGH (ref 0–44)
AST: 57 U/L — ABNORMAL HIGH (ref 15–41)
AST: 54 U/L — ABNORMAL HIGH (ref 15–41)
Albumin: 3.9 g/dL (ref 3.5–5.0)
Albumin: 3.5 g/dL (ref 3.5–5.0)
Alkaline Phosphatase: 64 U/L (ref 38–126)
Alkaline Phosphatase: 57 U/L (ref 38–126)
Anion gap: 11 (ref 5–15)
Anion gap: 10 (ref 5–15)
BUN: <5 mg/dL — ABNORMAL LOW (ref 6–20)
BUN: <5 mg/dL — ABNORMAL LOW (ref 6–20)
CO2: 20 mmol/L — ABNORMAL LOW (ref 22–32)
CO2: 22 mmol/L (ref 22–32)
Calcium: 9.1 mg/dL (ref 8.9–10.3)
Calcium: 8.8 mg/dL — ABNORMAL LOW (ref 8.9–10.3)
Chloride: 101 mmol/L (ref 98–111)
Chloride: 99 mmol/L (ref 98–111)
Creatinine, Ser: 0.66 mg/dL (ref 0.44–1.00)
Creatinine, Ser: 0.61 mg/dL (ref 0.44–1.00)
GFR calc Af Amer: >60 mL/min (ref >60)
GFR calc Af Amer: >60 mL/min (ref >60)
GFR calc non Af Amer: >60 mL/min (ref >60)
GFR calc non Af Amer: >60 mL/min (ref >60)
Glucose, Bld: 129 mg/dL — ABNORMAL HIGH (ref 70–99)
Glucose, Bld: 138 mg/dL — ABNORMAL HIGH (ref 70–99)
Potassium: 2.9 mmol/L — ABNORMAL LOW (ref 3.5–5.1)
Potassium: 2.9 mmol/L — ABNORMAL LOW (ref 3.5–5.1)
Sodium: 132 mmol/L — ABNORMAL LOW (ref 135–145)
Sodium: 131 mmol/L — ABNORMAL LOW (ref 135–145)
Total Bilirubin: 1.6 mg/dL — ABNORMAL HIGH (ref 0.3–1.2)
Total Bilirubin: 1.2 mg/dL (ref 0.3–1.2)
Total Protein: 6.7 g/dL (ref 6.5–8.1)
Total Protein: 6 g/dL — ABNORMAL LOW (ref 6.5–8.1)

## 2020-02-21 LAB — CBC WITH DIFFERENTIAL/PLATELET
Abs Immature Granulocytes: 0.02 10*3/uL (ref 0.00–0.07)
Basophils Absolute: 0 10*3/uL (ref 0.0–0.1)
Basophils Relative: 1 %
Eosinophils Absolute: 0.2 10*3/uL (ref 0.0–0.5)
Eosinophils Relative: 4 %
HCT: 38.1 % (ref 36.0–46.0)
Hemoglobin: 13.3 g/dL (ref 12.0–15.0)
Immature Granulocytes: 0 %
Lymphocytes Relative: 20 %
Lymphs Abs: 1.1 10*3/uL (ref 0.7–4.0)
MCH: 33.6 pg (ref 26.0–34.0)
MCHC: 34.9 g/dL (ref 30.0–36.0)
MCV: 96.2 fL (ref 80.0–100.0)
Monocytes Absolute: 0.5 10*3/uL (ref 0.1–1.0)
Monocytes Relative: 10 %
Neutro Abs: 3.5 10*3/uL (ref 1.7–7.7)
Neutrophils Relative %: 65 %
Platelets: 109 10*3/uL — ABNORMAL LOW (ref 150–400)
RBC: 3.96 MIL/uL (ref 3.87–5.11)
RDW: 13.7 % (ref 11.5–15.5)
WBC: 5.3 10*3/uL (ref 4.0–10.5)
nRBC: 0 % (ref 0.0–0.2)

## 2020-02-21 LAB — GLUCOSE, CAPILLARY
Glucose-Capillary: 135 mg/dL — ABNORMAL HIGH (ref 70–99)
Glucose-Capillary: 123 mg/dL — ABNORMAL HIGH (ref 70–99)
Glucose-Capillary: 127 mg/dL — ABNORMAL HIGH (ref 70–99)
Glucose-Capillary: 121 mg/dL — ABNORMAL HIGH (ref 70–99)
Glucose-Capillary: 114 mg/dL — ABNORMAL HIGH (ref 70–99)
Glucose-Capillary: 116 mg/dL — ABNORMAL HIGH (ref 70–99)
Glucose-Capillary: 128 mg/dL — ABNORMAL HIGH (ref 70–99)
Glucose-Capillary: 105 mg/dL — ABNORMAL HIGH (ref 70–99)
Glucose-Capillary: 108 mg/dL — ABNORMAL HIGH (ref 70–99)
Glucose-Capillary: 106 mg/dL — ABNORMAL HIGH (ref 70–99)
Glucose-Capillary: 155 mg/dL — ABNORMAL HIGH (ref 70–99)
Glucose-Capillary: 86 mg/dL (ref 70–99)

## 2020-02-21 LAB — BASIC METABOLIC PANEL
Anion gap: 12 (ref 5–15)
Anion gap: 9 (ref 5–15)
BUN: <5 mg/dL — ABNORMAL LOW (ref 6–20)
BUN: <5 mg/dL — ABNORMAL LOW (ref 6–20)
CO2: 16 mmol/L — ABNORMAL LOW (ref 22–32)
CO2: 21 mmol/L — ABNORMAL LOW (ref 22–32)
Calcium: 8.6 mg/dL — ABNORMAL LOW (ref 8.9–10.3)
Calcium: 8.9 mg/dL (ref 8.9–10.3)
Chloride: 101 mmol/L (ref 98–111)
Chloride: 102 mmol/L (ref 98–111)
Creatinine, Ser: 0.7 mg/dL (ref 0.44–1.00)
Creatinine, Ser: 0.8 mg/dL (ref 0.44–1.00)
GFR calc Af Amer: >60 mL/min (ref >60)
GFR calc Af Amer: >60 mL/min (ref >60)
GFR calc non Af Amer: >60 mL/min (ref >60)
GFR calc non Af Amer: >60 mL/min (ref >60)
Glucose, Bld: 139 mg/dL — ABNORMAL HIGH (ref 70–99)
Glucose, Bld: 154 mg/dL — ABNORMAL HIGH (ref 70–99)
Potassium: 2.9 mmol/L — ABNORMAL LOW (ref 3.5–5.1)
Potassium: 3.2 mmol/L — ABNORMAL LOW (ref 3.5–5.1)
Sodium: 130 mmol/L — ABNORMAL LOW (ref 135–145)
Sodium: 131 mmol/L — ABNORMAL LOW (ref 135–145)

## 2020-02-21 LAB — CBC
HCT: 33.1 % — ABNORMAL LOW (ref 36.0–46.0)
Hemoglobin: 11.6 g/dL — ABNORMAL LOW (ref 12.0–15.0)
MCH: 33.7 pg (ref 26.0–34.0)
MCHC: 35 g/dL (ref 30.0–36.0)
MCV: 96.2 fL (ref 80.0–100.0)
Platelets: 89 10*3/uL — ABNORMAL LOW (ref 150–400)
RBC: 3.44 MIL/uL — ABNORMAL LOW (ref 3.87–5.11)
RDW: 13.8 % (ref 11.5–15.5)
WBC: 4.4 10*3/uL (ref 4.0–10.5)
nRBC: 0 % (ref 0.0–0.2)

## 2020-02-21 LAB — BETA-HYDROXYBUTYRIC ACID
Beta-Hydroxybutyric Acid: 1.59 mmol/L — ABNORMAL HIGH (ref 0.05–0.27)
Beta-Hydroxybutyric Acid: 2.49 mmol/L — ABNORMAL HIGH (ref 0.05–0.27)

## 2020-02-21 LAB — PHOSPHORUS: Phosphorus: 1.1 mg/dL — ABNORMAL LOW (ref 2.5–4.6)

## 2020-02-21 LAB — MAGNESIUM: Magnesium: 1.8 mg/dL (ref 1.7–2.4)

## 2020-02-21 MED ORDER — LACTATED RINGERS IV SOLN: INTRAVENOUS | Status: DC

## 2020-02-21 MED ORDER — BOOST / RESOURCE BREEZE PO LIQD CUSTOM
1.0000 | Freq: Three times a day (TID) | ORAL | Status: DC
  Administered 2020-02-22 – 2020-02-23 (×3): 1 via ORAL

## 2020-02-21 MED ORDER — POTASSIUM CHLORIDE CRYS ER 20 MEQ PO TBCR
40.0000 meq | EXTENDED_RELEASE_TABLET | Freq: Two times a day (BID) | ORAL | Status: AC
  Administered 2020-02-21: 40 meq via ORAL
  Filled 2020-02-21 (×2): qty 2

## 2020-02-21 MED ORDER — INSULIN ASPART 100 UNIT/ML ~~LOC~~ SOLN: 0.0000 [IU] | SUBCUTANEOUS | Status: DC

## 2020-02-21 MED ORDER — PROSOURCE PLUS PO LIQD
30.0000 mL | Freq: Two times a day (BID) | ORAL | Status: DC
  Administered 2020-02-22: 30 mL via ORAL
  Filled 2020-02-21 (×5): qty 30

## 2020-02-21 MED ORDER — POTASSIUM CHLORIDE CRYS ER 20 MEQ PO TBCR: 40.0000 meq | EXTENDED_RELEASE_TABLET | Freq: Once | ORAL | Status: DC

## 2020-02-21 MED ORDER — POTASSIUM PHOSPHATES 15 MMOLE/5ML IV SOLN
30.0000 mmol | Freq: Once | INTRAVENOUS | Status: AC
  Administered 2020-02-21: 30 mmol via INTRAVENOUS
  Filled 2020-02-21: qty 10

## 2020-02-21 NOTE — Progress Notes (Signed)
PROGRESS NOTE    BENEDETTA SUNDSTROM  JKD:[id number] DOB: 1985-08-27 DOA: 02/19/2020 PCP: Patient, No Pcp Per      Brief Narrative:  Mrs. Wuest is a 34 y.o. F with alcohol use disorder who presents with chest/epigastric pain and vomiting for 3 days.  In the ER, CT showed pancreatitis, labs showed DKA.       Assessment & Plan:  Acute pancreatitis Patient presented with epigastric pain, vomiting, lipase 800, and pancreatic edema on CT.  History of heavy alcohol use (up to half a gallon liquor every few days).  Resolving, tolerating clear liquids today.  Abdominal pain improved. -Continue clear liquids today, advance to soft diet tomorrow if doing well -Continue IV fluids for now, if tolerating p.o., will stop tomorrow  -Ondansetron for nausea, Dilaudid for pain  Sinus tachycardia Due to pancreatitis  Transaminitis Likely alcoholic.  RUQ US shows no gallstone.  Improving.  Ketoacidosis, likely alcoholic Initially unclear if patient had undiagnosed diabetic ketoacidosis.  Treated with insulin drip and dextrose infusion and improved. Hemoglobin A1c 4.5%, suspect this was all alcoholic ketoacidosis. -Monitor sugars  Alcohol use disorder with uncomplicated withdrawal No prior history of DTs or seizures.  History of mild withdrawal in the past.  However, does report drinking up to 1/2 gallon liquor every one to two days during binges.  Improving -Continue thiamine and folate -Continue CIWA scoring with on demand Ativan   Gastritis -Continue pantoprazole   Hyponatremia Mild, asymptomatic, resolving  Hypophosphatemia Hypokalemia -Aggressive potassium and phosphate repletion Trend BMP    Disposition: Status is: Inpatient  Remains inpatient appropriate because:Persistent severe electrolyte disturbances   Dispo: The patient is from: Home              Anticipated d/c is to: Home              Anticipated d/c date is: 2 days              Patient currently is not  medically stable to d/c.                 MDM: The below labs and imaging reports reviewed and summarized above.  Medication management as above.      DVT prophylaxis: enoxaparin (LOVENOX) injection 40 mg Start: 02/20/20 1800  Code Status: FULL Family Communication:     Consultants:     Procedures:     Antimicrobials:      Culture data:              Subjective: She is still weak and tired.  She has no chest pain, confusion.  Abdominal pain is improving.        Objective: Vitals:   02/21/20 1000 02/21/20 1202 02/21/20 1558 02/21/20 1949  BP: 118/80 110/71 121/84 106/68  Pulse: 100 100 99 92  Resp: 18 18 18 18   Temp: 98.3 F (36.8 C) 98.6 F (37 C) 99.1 F (37.3 C) 98.7 F (37.1 C)  TempSrc: Oral Oral Oral Oral  SpO2: 100% 100% 100% 100%  Weight:      Height:        Intake/Output Summary (Last 24 hours) at 02/21/2020 2103 Last data filed at 02/21/2020 1700 Gross per 24 hour  Intake 1142.9 ml  Output --  Net 1142.9 ml   Filed Weights   02/20/20 0510  Weight: 72.6 kg    Examination: General appearance: Well-nourished adult female, lying in bed, no acute distress, appears tired.     HEENT: Anicteric, conjunctival pink, lids  and lashes normal.  No nasal deformity, discharge, or epistaxis. Skin: Warm and dry, numerous tattoos, no suspicious rashes or lesions Cardiac: Tachycardic, regular, normal JVP, no lower extremity edema Respiratory: Normal respiratory rate and rhythm, lungs clear without rales or wheezes Abdomen: Moderate epigastric tenderness, improved from previous, no guarding, ascites, distention   MSK: Normal muscle bulk and tone Neuro: Normal extraocular movements, speech fluent, moves all extremities with generalized weakness but normal coronation Psych: Attention normal, affect normal, judgment insight appear normal  patient was seen and examined.      Data Reviewed: I have personally reviewed following labs and  imaging studies:  CBC: Recent Labs  Lab 02/19/20 1847 02/20/20 1030 02/21/20 0415 02/21/20 1440  WBC 5.9 7.5 5.3 4.4  NEUTROABS  --  6.3 3.5  --   HGB 14.8 14.7 13.3 11.6*  HCT 46.5* 44.6 38.1 33.1*  MCV 105.9* 99.6 96.2 96.2  PLT 122* 130* 109* 89*   Basic Metabolic Panel: Recent Labs  Lab 02/19/20 1847 02/19/20 2205 02/20/20 1030 02/20/20 1638 02/20/20 2006 02/20/20 2349 02/21/20 0415 02/21/20 0805 02/21/20 1440  NA   < >  --  133*   < > 131* 130* 132* 131* 131*  K   < >  --  4.1   < > 3.4* 3.2* 2.9* 2.9* 2.9*  CL   < >  --  102   < > 103 102 101 101 99  CO2   < >  --  11*   < > 16* 16* 20* 21* 22  GLUCOSE   < >  --  231*   < > 200* 154* 129* 139* 138*  BUN   < >  --  7   < > <5* <5* <5* <5* <5*  CREATININE   < >  --  1.39*   < > 0.86 0.80 0.66 0.70 0.61  CALCIUM   < >  --  9.1   < > 8.3* 8.6* 9.1 8.9 8.8*  MG  --  2.1 2.8*  --   --   --  1.8  --   --   PHOS  --   --  <1.0*  --   --   --  1.1*  --   --    < > = values in this interval not displayed.   GFR: Estimated Creatinine Clearance: 100.7 mL/min (by C-G formula based on SCr of 0.61 mg/dL). Liver Function Tests: Recent Labs  Lab 02/20/20 1030 02/21/20 0415 02/21/20 1440  AST 124* 57* 54*  ALT 107* 68* 59*  ALKPHOS 76 64 57  BILITOT 2.6* 1.6* 1.2  PROT 8.3* 6.7 6.0*  ALBUMIN 4.7  4.6 3.9 3.5   Recent Labs  Lab 02/20/20 1030  LIPASE 801*   No results for input(s): AMMONIA in the last 168 hours. Coagulation Profile: No results for input(s): INR, PROTIME in the last 168 hours. Cardiac Enzymes: Recent Labs  Lab 02/20/20 1030  CKTOTAL 54   BNP (last 3 results) No results for input(s): PROBNP in the last 8760 hours. HbA1C: Recent Labs    02/20/20 0949  HGBA1C 4.5*   CBG: Recent Labs  Lab 02/21/20 0947 02/21/20 1048 02/21/20 1157 02/21/20 1649 02/21/20 2054  GLUCAP 105* 108* 106* 155* 86   Lipid Profile: No results for input(s): CHOL, HDL, LDLCALC, TRIG, CHOLHDL, LDLDIRECT in the  last 72 hours. Thyroid Function Tests: No results for input(s): TSH, T4TOTAL, FREET4, T3FREE, THYROIDAB in the last 72 hours. Anemia Panel: No  results for input(s): VITAMINB12, FOLATE, FERRITIN, TIBC, IRON, RETICCTPCT in the last 72 hours. Urine analysis:    Component Value Date/Time   COLORURINE YELLOW 02/20/2020 0713   APPEARANCEUR HAZY (A) 02/20/2020 0713   LABSPEC 1.024 02/20/2020 0713   PHURINE 6.0 02/20/2020 0713   GLUCOSEU NEGATIVE 02/20/2020 0713   HGBUR MODERATE (A) 02/20/2020 0713   BILIRUBINUR NEGATIVE 02/20/2020 0713   KETONESUR 80 (A) 02/20/2020 0713   PROTEINUR >=300 (A) 02/20/2020 0713   UROBILINOGEN 0.2 06/10/2015 1150   NITRITE NEGATIVE 02/20/2020 0713   LEUKOCYTESUR NEGATIVE 02/20/2020 0713   Sepsis Labs: @LABRCNTIP (procalcitonin:4,lacticacidven:4)  ) Recent Results (from the past 240 hour(s))  SARS Coronavirus 2 by RT PCR (hospital order, performed in [facility] hospital lab) Nasopharyngeal Nasopharyngeal Swab     Status: None   Collection Time: 02/20/20  5:29 AM   Specimen: Nasopharyngeal Swab  Result Value Ref Range Status   SARS Coronavirus 2 NEGATIVE NEGATIVE Final    Comment: (NOTE) SARS-CoV-2 target nucleic acids are NOT DETECTED.  The SARS-CoV-2 RNA is generally detectable in upper and lower respiratory specimens during the acute phase of infection. The lowest concentration of SARS-CoV-2 viral copies this assay can detect is 250 copies / mL. A negative result does not preclude SARS-CoV-2 infection and should not be used as the sole basis for treatment or other patient management decisions.  A negative result may occur with improper specimen collection / handling, submission of specimen other than nasopharyngeal swab, presence of viral mutation(s) within the areas targeted by this assay, and inadequate number of viral copies (<250 copies / mL). A negative result must be combined with clinical observations, patient history, and epidemiological  information.  Fact Sheet for Patients:   04/22/20  Fact Sheet for Healthcare Providers: BoilerBrush.com.cy  This test is not yet approved or  cleared by the https://pope.com/ FDA and has been authorized for detection and/or diagnosis of SARS-CoV-2 by FDA under an Emergency Use Authorization (EUA).  This EUA will remain in effect (meaning this test can be used) for the duration of the COVID-19 declaration under Section 564(b)(1) of the Act, 21 U.S.C. section 360bbb-3(b)(1), unless the authorization is terminated or revoked sooner.  Performed at [facility], [address] [place], [place]          Radiology Studies: 70017 Abdomen Complete  Result Date: 02/20/2020 CLINICAL DATA:  Alcohol abuse, pancreatitis, abdominal pain EXAM: ABDOMEN ULTRASOUND COMPLETE COMPARISON:  None. FINDINGS: Gallbladder: No gallstones or wall thickening visualized. No sonographic Murphy sign noted by sonographer. Common bile duct: Diameter: 3 mm in proximal diameter Liver: The hepatic parenchyma is diffusely echogenic and there is coarsening of the hepatic echotexture in keeping with changes of at least moderate hepatic steatosis. No focal intrahepatic masses are seen. There is no intrahepatic biliary ductal dilation. Portal vein is patent on color Doppler imaging with normal direction of blood flow towards the liver. IVC: No abnormality visualized. Pancreas: Visualized portion unremarkable. Spleen: Size and appearance within normal limits. Right Kidney: Length: 7.1 x 5.6 x 11.1 cm. Echogenicity within normal limits. No mass or hydronephrosis visualized. Left Kidney: Length: 7.0 x 11.8 cm. Echogenicity within normal limits. No mass or hydronephrosis visualized. Abdominal aorta: No aneurysm visualized. Other findings: None. IMPRESSION: Moderate hepatic steatosis. Electronically Signed   By: 04/22/2020 MD   On: 02/20/2020 14:35   CT ABDOMEN  PELVIS W CONTRAST  Result Date: 02/20/2020 CLINICAL DATA:  Acute nonlocalized abdominal pain. EXAM: CT ABDOMEN AND PELVIS  WITH CONTRAST TECHNIQUE: Multidetector CT imaging of the abdomen and pelvis was performed using the standard protocol following bolus administration of intravenous contrast. CONTRAST:  100mL OMNIPAQUE IOHEXOL 350 MG/ML SOLN COMPARISON:  None. FINDINGS: Lower chest:  No contributory findings. Hepatobiliary: Hepatic steatosis.No evidence of biliary obstruction or stone. Pancreas: Mild peripancreatic edema at the tail and head. No necrosis, collection, or ductal dilatation. Spleen: Unremarkable. Adrenals/Urinary Tract: Negative adrenals. No hydronephrosis or stone. Unremarkable bladder. Stomach/Bowel:  No obstruction. No appendicitis. Vascular/Lymphatic: No acute vascular abnormality. No mass or adenopathy. Reproductive:No pathologic findings. Other: No ascites or pneumoperitoneum. Musculoskeletal: No acute abnormalities. Bony excrescence superiorly from the medial left iliac crest with osteochondroma features, measuring up to 3.6 cm in transverse span. No mass effect on adjacent structures. IMPRESSION: 1. Mild acute pancreatitis. 2. Prominent hepatic steatosis. 3. 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 Electronically Signed   By: Marnee SpringJonathon  Watts M.D.   On: 02/20/2020 07:45        Scheduled Meds: . (feeding supplement) PROSource Plus  30 mL Oral BID BM  . enoxaparin (LOVENOX) injection  40 mg Subcutaneous Q24H  . feeding supplement  1 Container Oral TID BM  . folic acid  1 mg Oral Daily  . insulin aspart  0-7 Units Subcutaneous Q4H  . [START ON 02/22/2020] LORazepam  0-4 mg Intravenous Q12H   Or  . [START ON 02/22/2020] LORazepam  0-4 mg Oral Q12H  . multivitamin with minerals  1 tablet Oral Daily  . pantoprazole  20 mg Oral Daily  . potassium chloride  40 mEq Oral BID  . sodium chloride flush  10-40 mL Intracatheter Q12H  . thiamine  100 mg Oral Daily   Or  . thiamine  100 mg  Intravenous Daily   Continuous Infusions: . lactated ringers 100 mL/hr at 02/21/20 1211     LOS: 1 day    Time spent: 25 minutes    Alberteen Samhristopher P Jazma Pickel, MD [facility] 02/21/2020, 9:03 PM     Please page though Loretha StaplerAMION or Epic secure chat:  For password, contact charge nurse

## 2020-02-21 NOTE — TOC Initial Note (Signed)
Transition of Care [facility]) - Initial/Assessment Note    Patient Details  Name: Michele Rich MRN: [id number] Date of Birth: 14-May-1986  Transition of Care [facility]) CM/SW Contact:    Kermit Balo, RN Phone Number: 02/21/2020, 1:43 PM  Clinical Narrative:                 CM consulted for SA resources. CM provided the patient Outpatient and Inpatient counseling resources for her alcohol abuse.  Pt is without PCP. CM was able to get her an appt at [facility] with information on the AVS. TOC following for further d/c needs.   Expected Discharge Plan: Home/Self Care Barriers to Discharge: Continued Medical Work up   Patient Goals and CMS Choice        Expected Discharge Plan and Services Expected Discharge Plan: Home/Self Care   Discharge Planning Services: CM Consult   Living arrangements for the past 2 months: Single Family Home                                      Prior Living Arrangements/Services Living arrangements for the past 2 months: Single Family Home Lives with:: Minor Children, Parents Patient language and need for interpreter reviewed:: Yes Do you feel safe going back to the place where you live?: Yes        Care giver support system in place?: Yes (comment)   Criminal Activity/Legal Involvement Pertinent to Current Situation/Hospitalization: No - Comment as needed  Activities of Daily Living Home Assistive Devices/Equipment: None ADL Screening (condition at time of admission) Patient's cognitive ability adequate to safely complete daily activities?: Yes Is the patient deaf or have difficulty hearing?: No Does the patient have difficulty seeing, even when wearing glasses/contacts?: No Does the patient have difficulty concentrating, remembering, or making decisions?: No Patient able to express need for assistance with ADLs?: Yes Does the patient have difficulty dressing or bathing?: No Independently performs ADLs?: Yes (appropriate for  developmental age) Does the patient have difficulty walking or climbing stairs?: Yes Weakness of Legs: None Weakness of Arms/Hands: None  Permission Sought/Granted                  Emotional Assessment Appearance:: Appears stated age Attitude/Demeanor/Rapport: Lethargic Affect (typically observed): Accepting Orientation: : Oriented to Self, Oriented to Place, Oriented to  Time, Oriented to Situation Alcohol / Substance Use: Alcohol Use Psych Involvement: No (comment)  Admission diagnosis:  Alcoholic ketoacidosis [E87.2] Pancreatitis [K85.90] AKI (acute kidney injury) (HCC) [N17.9] Alcohol withdrawal syndrome, uncomplicated (HCC) [F10.230] Alcohol withdrawal syndrome with perceptual disturbance (HCC) [F10.232] Patient Active Problem List   Diagnosis Date Noted  . Alcohol withdrawal syndrome without complication (HCC) 02/20/2020  . Intractable nausea and vomiting 02/20/2020  . Epigastric abdominal tenderness without rebound tenderness 02/20/2020  . Pancreatitis 02/20/2020  . MDD (major depressive disorder) 01/04/2019  . Alcohol dependence with alcohol-induced mood disorder (HCC) 01/04/2019   PCP:  Patient, No Pcp Per Pharmacy:   [id number] [place], [place] - [address] AT [facility] OF [address] & [facility] [address] [place] [address] Phone: [phone] Fax: [phone]  [facility] [place], [place] - [address] [place] [address] Phone: [phone] Fax: [phone]     Social Determinants of Health (SDOH) Interventions    Readmission Risk Interventions No flowsheet data found.

## 2020-02-21 NOTE — Progress Notes (Signed)
Received to room 3W08 from ER via stretcher. Assisted to bed and positioned for comfort. Oriented to room, bed and unit. In no acute distress. Denies nausea at this time.

## 2020-02-21 NOTE — Progress Notes (Signed)
Initial Nutrition Assessment  DOCUMENTATION CODES:   Not applicable  INTERVENTION:   -Continue MVI with minerals daily -Continue Boost Breeze po TID, each supplement provides 250 kcal and 9 grams of protein -30 ml Prosource Plus BID, each supplement provides 100 kcals and 15 grams protein -RD will follow for diet advancement and adjust supplement regimen as appropriate  NUTRITION DIAGNOSIS:   Increased nutrient needs related to acute illness (acute pancreatitis) as evidenced by estimated needs.  GOAL:   Patient will meet greater than or equal to 90% of their needs  MONITOR:   PO intake, Supplement acceptance, Diet advancement, Labs, Weight trends, Skin, I & O's  REASON FOR ASSESSMENT:   Malnutrition Screening Tool    ASSESSMENT:   34 year old female with past medical history of alcoholism, major depressive disorder who presents to [facility] emergency department complaints of nausea and vomiting.  Pt admitted with alcohol withdrawal syndrome without complication, intractable nausea, and vomiting, and acute pancreatitis.   Reviewed I/O's: +2.1 L x 24 hours  Attempted to speak with pt via hospital room phone x 2, however, no answer.   Per H&P, pt has been experiencing severe nausea and abdominal pan for 5 days PTA, which pt attributes to consuming pizza at a water park. She also has history of alcohol abuse, consuming as much as half a gallon of vodka daily, but has cut back in the past few weeks (several shots of liquor and beer). She has not had any alcohol intake since symptoms have started.   Pt consuming 95% of meals on clear liquid diet. Noted she refused Boost Breeze supplements.  Reviewed wt hx; pt has experienced a 9% wt loss over the past 3 months, which is significant for time frame. Pt is at high risk for malnutrition due to ETOH abuse and weight loss, however, RD unable to identify at this time. Pt would greatly benefit from addition of oral nutrition  supplements.   Medications reviewed and include folic acid, MVI, KCl, thiamine, and lactated ringers infusion @ 100 ml/hr.    Labs reviewed: Na: 131, K: 2.9 (on PO supplementation), CBGS: 105-128 (inpatient orders for glycemic control are 0-7 unit insulin aspart every 4 hours).   Diet Order:   Diet Order            Diet clear liquid Room service appropriate? Yes; Fluid consistency: Thin  Diet effective now                 EDUCATION NEEDS:   No education needs have been identified at this time  Skin:  Skin Assessment: Reviewed RN Assessment  Last BM:  02/21/20  Height:   Ht Readings from Last 1 Encounters:  02/20/20 5\' 5"  (1.651 m)    Weight:   Wt Readings from Last 1 Encounters:  02/20/20 72.6 kg    Ideal Body Weight:  56.8 kg  BMI:  Body mass index is 26.63 kg/m.  Estimated Nutritional Needs:   Kcal:  2000-2200  Protein:  95-110 grams  Fluid:  > 2 L    04/22/20, RD, LDN, CDCES Registered Dietitian II Certified Diabetes Care and Education Specialist Please refer to [facility] for RD and/or RD on-call/weekend/after hours pager

## 2020-02-22 LAB — COMPREHENSIVE METABOLIC PANEL
ALT: 52 U/L — ABNORMAL HIGH (ref 0–44)
AST: 49 U/L — ABNORMAL HIGH (ref 15–41)
Albumin: 3.3 g/dL — ABNORMAL LOW (ref 3.5–5.0)
Alkaline Phosphatase: 53 U/L (ref 38–126)
Anion gap: 11 (ref 5–15)
BUN: <5 mg/dL — ABNORMAL LOW (ref 6–20)
CO2: 23 mmol/L (ref 22–32)
Calcium: 8.9 mg/dL (ref 8.9–10.3)
Chloride: 100 mmol/L (ref 98–111)
Creatinine, Ser: 0.52 mg/dL (ref 0.44–1.00)
GFR calc Af Amer: >60 mL/min (ref >60)
GFR calc non Af Amer: >60 mL/min (ref >60)
Glucose, Bld: 92 mg/dL (ref 70–99)
Potassium: 2.8 mmol/L — ABNORMAL LOW (ref 3.5–5.1)
Sodium: 134 mmol/L — ABNORMAL LOW (ref 135–145)
Total Bilirubin: 0.9 mg/dL (ref 0.3–1.2)
Total Protein: 5.8 g/dL — ABNORMAL LOW (ref 6.5–8.1)

## 2020-02-22 LAB — GLUCOSE, CAPILLARY
Glucose-Capillary: 112 mg/dL — ABNORMAL HIGH (ref 70–99)
Glucose-Capillary: 145 mg/dL — ABNORMAL HIGH (ref 70–99)
Glucose-Capillary: 167 mg/dL — ABNORMAL HIGH (ref 70–99)
Glucose-Capillary: 96 mg/dL (ref 70–99)
Glucose-Capillary: 96 mg/dL (ref 70–99)
Glucose-Capillary: 99 mg/dL (ref 70–99)

## 2020-02-22 LAB — CBC
HCT: 30.2 % — ABNORMAL LOW (ref 36.0–46.0)
Hemoglobin: 10.4 g/dL — ABNORMAL LOW (ref 12.0–15.0)
MCH: 33 pg (ref 26.0–34.0)
MCHC: 34.4 g/dL (ref 30.0–36.0)
MCV: 95.9 fL (ref 80.0–100.0)
Platelets: 95 10*3/uL — ABNORMAL LOW (ref 150–400)
RBC: 3.15 MIL/uL — ABNORMAL LOW (ref 3.87–5.11)
RDW: 13.9 % (ref 11.5–15.5)
WBC: 3.4 10*3/uL — ABNORMAL LOW (ref 4.0–10.5)
nRBC: 0 % (ref 0.0–0.2)

## 2020-02-22 LAB — MAGNESIUM: Magnesium: 1.5 mg/dL — ABNORMAL LOW (ref 1.7–2.4)

## 2020-02-22 LAB — PHOSPHORUS: Phosphorus: 1.9 mg/dL — ABNORMAL LOW (ref 2.5–4.6)

## 2020-02-22 MED ORDER — POTASSIUM PHOSPHATES 15 MMOLE/5ML IV SOLN
30.0000 mmol | Freq: Once | INTRAVENOUS | Status: AC
  Administered 2020-02-22: 30 mmol via INTRAVENOUS
  Filled 2020-02-22: qty 10

## 2020-02-22 MED ORDER — POTASSIUM CHLORIDE CRYS ER 20 MEQ PO TBCR
40.0000 meq | EXTENDED_RELEASE_TABLET | Freq: Three times a day (TID) | ORAL | Status: AC
  Administered 2020-02-22 (×3): 40 meq via ORAL
  Filled 2020-02-22 (×4): qty 2

## 2020-02-22 MED ORDER — MAGNESIUM SULFATE 4 GM/100ML IV SOLN
4.0000 g | Freq: Once | INTRAVENOUS | Status: AC
  Administered 2020-02-22: 4 g via INTRAVENOUS
  Filled 2020-02-22: qty 100

## 2020-02-22 NOTE — Progress Notes (Signed)
PROGRESS NOTE    Michele Rich  HER:[id number] DOB: [birth year] DOA: 02/19/2020 PCP: Patient, No Pcp Per      Brief Narrative:  Michele Rich is a 34 y.o. F with alcohol use disorder who presents with chest/epigastric pain and vomiting for 3 days.  In the ER, CT showed pancreatitis, labs showed DKA.       Assessment & Plan:  Acute pancreatitis Patient presented with epigastric pain, vomiting, lipase 800, and pancreatic edema on CT.  History of heavy alcohol use (up to half a gallon liquor every few days).  Pain stable to improving.  No vomiting.  Tolerated clear liquids yesterday. -Advance to soft diet -Stop IV fluids -Ondansetron for nausea, Dilaudid for pain     Sinus tachycardia Due to pancreatitis, resolved  Transaminitis Likely alcoholic.  RUQ US shows no gallstone. LFTs pending today   Alcoholic ketoacidosis Initially unclear if patient had undiagnosed diabetic ketoacidosis.  Treated with insulin drip and dextrose infusion and resolved. Hemoglobin A1c 4.5%, suspect this was all alcoholic ketoacidosis.  Use disorder with uncomplicated alcohol withdrawal No prior history of DTs or seizures.  History of mild withdrawal in the past.  However, does report drinking up to 1/2 gallon liquor every one to two days during binges.  Improving, no complicated withdrawals -Continue thiamine and folate -Continue CIWA scoring with on-demand lorazepam   Gastritis -Continue pantoprazole   Hyponatremia Sodium pending today, asymptomatic  Hypophosphatemia Hypokalemia BMP pending today -Trend BMP -Supplement potassium and phosphate    Disposition: Status is: Inpatient  Remains inpatient appropriate because: The patient has not yet advance to solid diet, and still has unresolved electrolyte abnormalities that are severe   Dispo: The patient is from: Home              Anticipated d/c is to: Home              Anticipated d/c date is: Likely tomorrow               Patient currently is not medically stable to d/c.    We will advance the patient to soft diet today.  If she is able to tolerate soft diet, her pain is still improving, her electrolytes are resolved, home tomorrow.             MDM: The below labs and imaging reports reviewed and summarized above.  Medication management as above.       DVT prophylaxis: enoxaparin (LOVENOX) injection 40 mg Start: 02/20/20 1800  Code Status: FULL Family Communication:     Consultants:     Procedures:     Antimicrobials:      Culture data:              Subjective: She has some right upper quadrant pain after bowel movements, otherwise pain is improving.  She still very weak.  No chest pain, confusion, vomiting.  No fever.        Objective: Vitals:   02/21/20 1558 02/21/20 1949 02/21/20 2337 02/22/20 0342  BP: 121/84 106/68 126/83 103/75  Pulse: 99 92 99 93  Resp: 18 18 18 18   Temp: 99.1 F (37.3 C) 98.7 F (37.1 C) 98.7 F (37.1 C) 98.6 F (37 C)  TempSrc: Oral Oral Oral Oral  SpO2: 100% 100% 100% 100%  Weight:      Height:        Intake/Output Summary (Last 24 hours) at 02/22/2020 0746 Last data filed at 02/21/2020 1700 Gross per 24 hour  Intake 1142.9 ml  Output --  Net 1142.9 ml   Filed Weights   02/20/20 0510  Weight: 72.6 kg    Examination: General appearance: Well-nourished adult female, lying in bed, no acute distress, appears tired     HEENT: Anicteric, conjunctival pink, lids and lashes normal.  No nasal deformity, discharge, or epistaxis Skin: Warm and dry, no suspicious rashes or lesions, numerous tattoos. Cardiac: Heart rate regular, normal JVP, no lower extremity edema Respiratory: Normal respiratory rate and rhythm, lungs clear without rales or wheezes Abdomen: No tenderness to palpation, no guarding.  No ascites or distention.  No stigmata of chronic liver disease. MSK:  Neuro: Extraocular movements intact, moves all extremities  with normal strength and orientation, speech fluent Psych: Attention normal, affect normal, judgment insight appear normal     Data Reviewed: I have personally reviewed following labs and imaging studies:  CBC: Recent Labs  Lab 02/19/20 1847 02/20/20 1030 02/21/20 0415 02/21/20 1440  WBC 5.9 7.5 5.3 4.4  NEUTROABS  --  6.3 3.5  --   HGB 14.8 14.7 13.3 11.6*  HCT 46.5* 44.6 38.1 33.1*  MCV 105.9* 99.6 96.2 96.2  PLT 122* 130* 109* 89*   Basic Metabolic Panel: Recent Labs  Lab 02/19/20 1847 02/19/20 2205 02/20/20 1030 02/20/20 1638 02/20/20 2006 02/20/20 2349 02/21/20 0415 02/21/20 0805 02/21/20 1440  NA   < >  --  133*   < > 131* 130* 132* 131* 131*  K   < >  --  4.1   < > 3.4* 3.2* 2.9* 2.9* 2.9*  CL   < >  --  102   < > 103 102 101 101 99  CO2   < >  --  11*   < > 16* 16* 20* 21* 22  GLUCOSE   < >  --  231*   < > 200* 154* 129* 139* 138*  BUN   < >  --  7   < > <5* <5* <5* <5* <5*  CREATININE   < >  --  1.39*   < > 0.86 0.80 0.66 0.70 0.61  CALCIUM   < >  --  9.1   < > 8.3* 8.6* 9.1 8.9 8.8*  MG  --  2.1 2.8*  --   --   --  1.8  --   --   PHOS  --   --  <1.0*  --   --   --  1.1*  --   --    < > = values in this interval not displayed.   GFR: Estimated Creatinine Clearance: 100.7 mL/min (by C-G formula based on SCr of 0.61 mg/dL). Liver Function Tests: Recent Labs  Lab 02/20/20 1030 02/21/20 0415 02/21/20 1440  AST 124* 57* 54*  ALT 107* 68* 59*  ALKPHOS 76 64 57  BILITOT 2.6* 1.6* 1.2  PROT 8.3* 6.7 6.0*  ALBUMIN 4.7  4.6 3.9 3.5   Recent Labs  Lab 02/20/20 1030  LIPASE 801*   No results for input(s): AMMONIA in the last 168 hours. Coagulation Profile: No results for input(s): INR, PROTIME in the last 168 hours. Cardiac Enzymes: Recent Labs  Lab 02/20/20 1030  CKTOTAL 54   BNP (last 3 results) No results for input(s): PROBNP in the last 8760 hours. HbA1C: Recent Labs    02/20/20 0949  HGBA1C 4.5*   CBG: Recent Labs  Lab  02/21/20 1157 02/21/20 1649 02/21/20 2054 02/22/20 0010 02/22/20 0341  GLUCAP 106*  155* 86 96 99   Lipid Profile: No results for input(s): CHOL, HDL, LDLCALC, TRIG, CHOLHDL, LDLDIRECT in the last 72 hours. Thyroid Function Tests: No results for input(s): TSH, T4TOTAL, FREET4, T3FREE, THYROIDAB in the last 72 hours. Anemia Panel: No results for input(s): VITAMINB12, FOLATE, FERRITIN, TIBC, IRON, RETICCTPCT in the last 72 hours. Urine analysis:    Component Value Date/Time   COLORURINE YELLOW 02/20/2020 0713   APPEARANCEUR HAZY (A) 02/20/2020 0713   LABSPEC 1.024 02/20/2020 0713   PHURINE 6.0 02/20/2020 0713   GLUCOSEU NEGATIVE 02/20/2020 0713   HGBUR MODERATE (A) 02/20/2020 0713   BILIRUBINUR NEGATIVE 02/20/2020 0713   KETONESUR 80 (A) 02/20/2020 0713   PROTEINUR >=300 (A) 02/20/2020 0713   UROBILINOGEN 0.2 06/10/2015 1150   NITRITE NEGATIVE 02/20/2020 0713   LEUKOCYTESUR NEGATIVE 02/20/2020 0713   Sepsis Labs: @LABRCNTIP (procalcitonin:4,lacticacidven:4)  ) Recent Results (from the past 240 hour(s))  SARS Coronavirus 2 by RT PCR (hospital order, performed in [facility] hospital lab) Nasopharyngeal Nasopharyngeal Swab     Status: None   Collection Time: 02/20/20  5:29 AM   Specimen: Nasopharyngeal Swab  Result Value Ref Range Status   SARS Coronavirus 2 NEGATIVE NEGATIVE Final    Comment: (NOTE) SARS-CoV-2 target nucleic acids are NOT DETECTED.  The SARS-CoV-2 RNA is generally detectable in upper and lower respiratory specimens during the acute phase of infection. The lowest concentration of SARS-CoV-2 viral copies this assay can detect is 250 copies / mL. A negative result does not preclude SARS-CoV-2 infection and should not be used as the sole basis for treatment or other patient management decisions.  A negative result may occur with improper specimen collection / handling, submission of specimen other than nasopharyngeal swab, presence of viral mutation(s) within  the areas targeted by this assay, and inadequate number of viral copies (<250 copies / mL). A negative result must be combined with clinical observations, patient history, and epidemiological information.  Fact Sheet for Patients:   04/22/20  Fact Sheet for Healthcare Providers: BoilerBrush.com.cy  This test is not yet approved or  cleared by the https://pope.com/ FDA and has been authorized for detection and/or diagnosis of SARS-CoV-2 by FDA under an Emergency Use Authorization (EUA).  This EUA will remain in effect (meaning this test can be used) for the duration of the COVID-19 declaration under Section 564(b)(1) of the Act, 21 U.S.C. section 360bbb-3(b)(1), unless the authorization is terminated or revoked sooner.  Performed at [facility], [address], [place], [place]          Radiology Studies: [address]31 Abdomen Complete  Result Date: 02/20/2020 CLINICAL DATA:  Alcohol abuse, pancreatitis, abdominal pain EXAM: ABDOMEN ULTRASOUND COMPLETE COMPARISON:  None. FINDINGS: Gallbladder: No gallstones or wall thickening visualized. No sonographic Murphy sign noted by sonographer. Common bile duct: Diameter: 3 mm in proximal diameter Liver: The hepatic parenchyma is diffusely echogenic and there is coarsening of the hepatic echotexture in keeping with changes of at least moderate hepatic steatosis. No focal intrahepatic masses are seen. There is no intrahepatic biliary ductal dilation. Portal vein is patent on color Doppler imaging with normal direction of blood flow towards the liver. IVC: No abnormality visualized. Pancreas: Visualized portion unremarkable. Spleen: Size and appearance within normal limits. Right Kidney: Length: 7.1 x 5.6 x 11.1 cm. Echogenicity within normal limits. No mass or hydronephrosis visualized. Left Kidney: Length: 7.0 x 11.8 cm. Echogenicity within normal limits. No mass or hydronephrosis  visualized. Abdominal aorta: No aneurysm visualized. Other  findings: None. IMPRESSION: Moderate hepatic steatosis. Electronically Signed   By: Helyn Numbers MD   On: 02/20/2020 14:35        Scheduled Meds: . (feeding supplement) PROSource Plus  30 mL Oral BID BM  . enoxaparin (LOVENOX) injection  40 mg Subcutaneous Q24H  . feeding supplement  1 Container Oral TID BM  . folic acid  1 mg Oral Daily  . LORazepam  0-4 mg Intravenous Q12H   Or  . LORazepam  0-4 mg Oral Q12H  . multivitamin with minerals  1 tablet Oral Daily  . pantoprazole  20 mg Oral Daily  . potassium chloride  40 mEq Oral BID  . sodium chloride flush  10-40 mL Intracatheter Q12H  . thiamine  100 mg Oral Daily   Or  . thiamine  100 mg Intravenous Daily   Continuous Infusions:    LOS: 2 days    Time spent: 25 minutes    Alberteen Sam, MD [facility] 02/22/2020, 7:46 AM     Please page though AMION or Epic secure chat:  For password, contact charge nurse

## 2020-02-23 LAB — CBC
HCT: 35.3 % — ABNORMAL LOW (ref 36.0–46.0)
Hemoglobin: 12.1 g/dL (ref 12.0–15.0)
MCH: 33.9 pg (ref 26.0–34.0)
MCHC: 34.3 g/dL (ref 30.0–36.0)
MCV: 98.9 fL (ref 80.0–100.0)
Platelets: 131 10*3/uL — ABNORMAL LOW (ref 150–400)
RBC: 3.57 MIL/uL — ABNORMAL LOW (ref 3.87–5.11)
RDW: 14.1 % (ref 11.5–15.5)
WBC: 4 10*3/uL (ref 4.0–10.5)
nRBC: 0 % (ref 0.0–0.2)

## 2020-02-23 LAB — COMPREHENSIVE METABOLIC PANEL
ALT: 64 U/L — ABNORMAL HIGH (ref 0–44)
AST: 70 U/L — ABNORMAL HIGH (ref 15–41)
Albumin: 3.7 g/dL (ref 3.5–5.0)
Alkaline Phosphatase: 62 U/L (ref 38–126)
Anion gap: 11 (ref 5–15)
BUN: <5 mg/dL — ABNORMAL LOW (ref 6–20)
CO2: 26 mmol/L (ref 22–32)
Calcium: 9.2 mg/dL (ref 8.9–10.3)
Chloride: 101 mmol/L (ref 98–111)
Creatinine, Ser: 0.48 mg/dL (ref 0.44–1.00)
GFR calc Af Amer: >60 mL/min (ref >60)
GFR calc non Af Amer: >60 mL/min (ref >60)
Glucose, Bld: 115 mg/dL — ABNORMAL HIGH (ref 70–99)
Potassium: 2.7 mmol/L — CL (ref 3.5–5.1)
Sodium: 138 mmol/L (ref 135–145)
Total Bilirubin: 0.4 mg/dL (ref 0.3–1.2)
Total Protein: 7.1 g/dL (ref 6.5–8.1)

## 2020-02-23 LAB — PHOSPHORUS: Phosphorus: 3 mg/dL (ref 2.5–4.6)

## 2020-02-23 LAB — MAGNESIUM: Magnesium: 2.2 mg/dL (ref 1.7–2.4)

## 2020-02-23 LAB — POTASSIUM: Potassium: 3.5 mmol/L (ref 3.5–5.1)

## 2020-02-23 MED ORDER — GABAPENTIN 100 MG PO CAPS: 100.0000 mg | ORAL_CAPSULE | Freq: Three times a day (TID) | ORAL | Status: DC

## 2020-02-23 MED ORDER — FOLIC ACID 1 MG PO TABS: 1.0000 mg | ORAL_TABLET | Freq: Every day | ORAL | 2 refills | Status: DC

## 2020-02-23 MED ORDER — GABAPENTIN 100 MG PO CAPS: 100.0000 mg | ORAL_CAPSULE | Freq: Three times a day (TID) | ORAL | 1 refills | Status: DC

## 2020-02-23 MED ORDER — POTASSIUM CHLORIDE CRYS ER 20 MEQ PO TBCR
40.0000 meq | EXTENDED_RELEASE_TABLET | Freq: Once | ORAL | Status: AC
  Administered 2020-02-23: 40 meq via ORAL
  Filled 2020-02-23: qty 2

## 2020-02-23 MED ORDER — POTASSIUM CHLORIDE CRYS ER 20 MEQ PO TBCR: 40.0000 meq | EXTENDED_RELEASE_TABLET | ORAL | Status: DC

## 2020-02-23 MED ORDER — GABAPENTIN 300 MG PO CAPS
300.0000 mg | ORAL_CAPSULE | Freq: Three times a day (TID) | ORAL | Status: DC
  Administered 2020-02-23: 300 mg via ORAL
  Filled 2020-02-23: qty 1

## 2020-02-23 MED ORDER — POTASSIUM CHLORIDE CRYS ER 20 MEQ PO TBCR: 40.0000 meq | EXTENDED_RELEASE_TABLET | Freq: Once | ORAL | Status: DC

## 2020-02-23 MED ORDER — THIAMINE HCL 100 MG PO TABS: 100.0000 mg | ORAL_TABLET | Freq: Every day | ORAL | 2 refills | Status: DC

## 2020-02-23 MED ORDER — POTASSIUM CHLORIDE 10 MEQ/100ML IV SOLN
10.0000 meq | INTRAVENOUS | Status: AC
  Administered 2020-02-23 (×4): 10 meq via INTRAVENOUS
  Filled 2020-02-23 (×4): qty 100

## 2020-02-23 MED ORDER — POTASSIUM CHLORIDE ER 10 MEQ PO TBCR: 10.0000 meq | EXTENDED_RELEASE_TABLET | Freq: Every day | ORAL | 0 refills | Status: DC

## 2020-02-23 NOTE — Discharge Summary (Signed)
Physician Discharge Summary  Michele Rich FAO:[id number] DOB: 02/06/1986 DOA: 02/19/2020  PCP: Patient, No Pcp Per  Admit date: 02/19/2020 Discharge date: 02/23/2020  Admitted From: Home  Disposition:  Home   Recommendations for Outpatient Follow-up:  1. Follow up with new PCP in 2 weeks 2. Please obtain CMP in one week to check K and AST/ALT     Home Health: None  Equipment/Devices: None  Discharge Condition: Good  CODE STATUS: FULL Diet recommendation: Soft  Brief/Interim Summary: Mrs. Michele Rich is a 34 y.o. F with alcohol use disorder who presents with chest/epigastric pain and vomiting for 3 days.  In the ER, CT showed pancreatitis, labs showed metabolic acidosis.     PRINCIPAL HOSPITAL DIAGNOSIS: Acute alcoholic pancreatitis    Discharge Diagnoses:   Acute alcoholic pancreatitis Patient presented with epigastric pain, vomiting, lipase 800, and pancreatic edema on CT.  History of heavy alcohol use (up to half a gallon liquor every few days).  Started on fluids, bowel rest.  Symptoms rapidly improved, electrolytes normal, able to tolerate oral diet/solid food without pain or vomiting.    Alcoholic hepatitis RUQ US showed steatosis, no gallstones.  Liver synthetic function normal.  LFTs improved toward normal    Alcoholic ketoacidosis Initially unclear if patient had undiagnosed diabetic ketoacidosis.  Treated with insulin drip and dextrose infusion and resolved.  Hemoglobin A1c 4.5%, suspect this was all alcoholic ketoacidosis.   Severe alcohol use disorder with uncomplicated alcohol withdrawal No prior history of DTs or seizures.  History of mild withdrawal in the past.  Did report drinking up to 1/2 gallon liquor every one to two days during binges.  Treated with fluids, thiamine, folate and on-demand lorazepam.  Started on gabapentin for subacute withdrawal at discharge.  Plan for 100 mg TID for 2 months then taper off.     Hyponatremia Hypophosphatemia Hypokalemia K improved by time of discharge.         Discharge Instructions  Discharge Instructions    Discharge instructions   Complete by: As directed    From Dr. Maryfrances Bunnellanford: You were admitted for pancreatitis from alcohol. You should advance your diet slowly.   Eat a BRAT diet: bananas, rice, applesauce, toast and other "easy" foods (pudding, mashed potatoes, crackers).  Nothing greasy for fatty.   You can slowly advance this back to normal after a few weeks   For the alcohol withdrawal, to reduce the symptoms of withdrawal that are likely to last for a few weeks or months: Take gabapentin 100 mg three times adily  Take this for two months and then taper off. (In other words, when you have about 6 pills left, then reduce to twice daily for 2 days then once daily for 2 days then stop)  Also, thiamine and folate are vitamins that your body loses when you drink alcohol Take thiamine and folate for 3 months  Take potassium 10 mEq at night before bed for 2 weeks  Follow up with a new primary care doctor Have them check your labs at the follow up appointment Tell them you need labs checked  Follow up with a substance use treatment center   Increase activity slowly   Complete by: As directed      Allergies as of 02/23/2020      Reactions   Apple Fruit Extract Hives   Only applesauce, pt can eat apples      Medication List    STOP taking these medications   chlordiazePOXIDE 25 MG capsule Commonly  known as: LIBRIUM   diphenhydrAMINE 25 MG tablet Commonly known as: BENADRYL   hydrOXYzine 25 MG tablet Commonly known as: ATARAX/VISTARIL     TAKE these medications   folic acid 1 MG tablet Commonly known as: FOLVITE Take 1 tablet (1 mg total) by mouth daily. Start taking on: February 24, 2020   gabapentin 100 MG capsule Commonly known as: NEURONTIN Take 1 capsule (100 mg total) by mouth 3 (three) times daily.   potassium chloride  10 MEQ tablet Commonly known as: KLOR-CON Take 1 tablet (10 mEq total) by mouth at bedtime.   thiamine 100 MG tablet Take 1 tablet (100 mg total) by mouth daily. Start taking on: February 24, 2020       Follow-up Information    [facility] Follow up on 03/11/2020.   Why: Your appt time is 1:50 pm. Please arrive early and bring a picture ID, insurance card and current medications. Contact information: [place] [id number] [phone]             Allergies  Allergen Reactions  . Apple Fruit Extract Hives    Only applesauce, pt can eat apples    Consultations:     Procedures/Studies: DG Chest 2 View  Result Date: 02/19/2020 CLINICAL DATA:  34 year old female with chest pain. EXAM: CHEST - 2 VIEW COMPARISON:  Chest radiograph dated 08/15/2019. FINDINGS: The lungs are clear. There is no pleural effusion pneumothorax. The cardiac silhouette is within normal limits. No acute osseous pathology. IMPRESSION: No active cardiopulmonary disease. Electronically Signed   By: Elgie Collard M.D.   On: 02/19/2020 19:32   US Abdomen Complete  Result Date: 02/20/2020 CLINICAL DATA:  Alcohol abuse, pancreatitis, abdominal pain EXAM: ABDOMEN ULTRASOUND COMPLETE COMPARISON:  None. FINDINGS: Gallbladder: No gallstones or wall thickening visualized. No sonographic Murphy sign noted by sonographer. Common bile duct: Diameter: 3 mm in proximal diameter Liver: The hepatic parenchyma is diffusely echogenic and there is coarsening of the hepatic echotexture in keeping with changes of at least moderate hepatic steatosis. No focal intrahepatic masses are seen. There is no intrahepatic biliary ductal dilation. Portal vein is patent on color Doppler imaging with normal direction of blood flow towards the liver. IVC: No abnormality visualized. Pancreas: Visualized portion unremarkable. Spleen: Size and appearance within normal limits.  Right Kidney: Length: 7.1 x 5.6 x 11.1 cm. Echogenicity within normal limits. No mass or hydronephrosis visualized. Left Kidney: Length: 7.0 x 11.8 cm. Echogenicity within normal limits. No mass or hydronephrosis visualized. Abdominal aorta: No aneurysm visualized. Other findings: None. IMPRESSION: Moderate hepatic steatosis. Electronically Signed   By: Helyn Numbers MD   On: 02/20/2020 14:35   CT ABDOMEN PELVIS W CONTRAST  Result Date: 02/20/2020 CLINICAL DATA:  Acute nonlocalized abdominal pain. EXAM: CT ABDOMEN AND PELVIS WITH CONTRAST TECHNIQUE: Multidetector CT imaging of the abdomen and pelvis was performed using the standard protocol following bolus administration of intravenous contrast. CONTRAST:  OMNIPAQUE IOHEXOL 350 MG/ML SOLN COMPARISON:  None. FINDINGS: Lower chest:  No contributory findings. Hepatobiliary: Hepatic steatosis.No evidence of biliary obstruction or stone. Pancreas: Mild peripancreatic edema at the tail and head. No necrosis, collection, or ductal dilatation. Spleen: Unremarkable. Adrenals/Urinary Tract: Negative adrenals. No hydronephrosis or stone. Unremarkable bladder. Stomach/Bowel:  No obstruction. No appendicitis. Vascular/Lymphatic: No acute vascular abnormality. No mass or adenopathy. Reproductive:No pathologic findings. Other: No ascites or pneumoperitoneum. Musculoskeletal: No acute abnormalities. Bony excrescence superiorly from the medial left iliac crest with osteochondroma  features, measuring up to 3.6 cm in transverse span. No mass effect on adjacent structures. IMPRESSION: 1. Mild acute pancreatitis. 2. Prominent hepatic steatosis. 3. 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 Electronically Signed   By: Marnee Spring M.D.   On: 02/20/2020 07:45       Subjective: Feeling well.  Sleepy after gabapentin.  No vomiting or pain with eating.  No dizziness, palpitations, confusion.  Discharge Exam: Vitals:   02/23/20 1214 02/23/20 1615  BP: 129/86 112/76   Pulse: 90 93  Resp: 14 18  Temp: 98.5 F (36.9 C) 98.4 F (36.9 C)  SpO2: 100% 100%   Vitals:   02/23/20 0342 02/23/20 0852 02/23/20 1214 02/23/20 1615  BP: 123/81 105/64 129/86 112/76  Pulse: 87 100 90 93  Resp: 17 16 14 18   Temp: 98.7 F (37.1 C) 99.8 F (37.7 C) 98.5 F (36.9 C) 98.4 F (36.9 C)  TempSrc: Oral Oral Oral Oral  SpO2: 100% 100% 100% 100%  Weight:      Height:        General: Pt is alert, awake, not in acute distress Cardiovascular: RRR, nl S1-S2, no murmurs appreciated.   No LE edema.   Respiratory: Normal respiratory rate and rhythm.  CTAB without rales or wheezes. Abdominal: Abdomen soft and non-tender.  No distension or HSM.   Neuro/Psych: Strength symmetric in upper and lower extremities.  Judgment and insight appear normal.   The results of significant diagnostics from this hospitalization (including imaging, microbiology, ancillary and laboratory) are listed below for reference.     Microbiology: Recent Results (from the past 240 hour(s))  SARS Coronavirus 2 by RT PCR (hospital order, performed in [facility] hospital lab) Nasopharyngeal Nasopharyngeal Swab     Status: None   Collection Time: 02/20/20  5:29 AM   Specimen: Nasopharyngeal Swab  Result Value Ref Range Status   SARS Coronavirus 2 NEGATIVE NEGATIVE Final    Comment: (NOTE) SARS-CoV-2 target nucleic acids are NOT DETECTED.  The SARS-CoV-2 RNA is generally detectable in upper and lower respiratory specimens during the acute phase of infection. The lowest concentration of SARS-CoV-2 viral copies this assay can detect is 250 copies / mL. A negative result does not preclude SARS-CoV-2 infection and should not be used as the sole basis for treatment or other patient management decisions.  A negative result may occur with improper specimen collection / handling, submission of specimen other than nasopharyngeal swab, presence of viral mutation(s) within the areas targeted by this assay,  and inadequate number of viral copies (<250 copies / mL). A negative result must be combined with clinical observations, patient history, and epidemiological information.  Fact Sheet for Patients:   04/22/20  Fact Sheet for Healthcare Providers: BoilerBrush.com.cy  This test is not yet approved or  cleared by the https://pope.com/ FDA and has been authorized for detection and/or diagnosis of SARS-CoV-2 by FDA under an Emergency Use Authorization (EUA).  This EUA will remain in effect (meaning this test can be used) for the duration of the COVID-19 declaration under Section 564(b)(1) of the Act, 21 U.S.C. section 360bbb-3(b)(1), unless the authorization is terminated or revoked sooner.  Performed at [facility], [address], [place], [place]      Labs: BNP (last 3 results) No results for input(s): BNP in the last 8760 hours. Basic Metabolic Panel: Recent Labs  Lab 02/19/20 1847 02/19/20 2205 02/20/20 1030 02/20/20 1638 02/21/20 0415 02/21/20 0805 02/21/20 1440 02/22/20 0727 02/23/20 0920  NA   < >  --  133*   < > 132* 131* 131* 134* 138  K   < >  --  4.1   < > 2.9* 2.9* 2.9* 2.8* 2.7*  CL   < >  --  102   < > 101 101 99 100 101  CO2   < >  --  11*   < > 20* 21* 22 23 26   GLUCOSE   < >  --  231*   < > 129* 139* 138* 92 115*  BUN   < >  --  7   < > <5* <5* <5* <5* <5*  CREATININE   < >  --  1.39*   < > 0.66 0.70 0.61 0.52 0.48  CALCIUM   < >  --  9.1   < > 9.1 8.9 8.8* 8.9 9.2  MG  --  2.1 2.8*  --  1.8  --   --  1.5* 2.2  PHOS  --   --  <1.0*  --  1.1*  --   --  1.9* 3.0   < > = values in this interval not displayed.   Liver Function Tests: Recent Labs  Lab 02/20/20 1030 02/21/20 0415 02/21/20 1440 02/22/20 0727 02/23/20 0920  AST 124* 57* 54* 49* 70*  ALT 107* 68* 59* 52* 64*  ALKPHOS 76 64 57 53 62  BILITOT 2.6* 1.6* 1.2 0.9 0.4  PROT 8.3* 6.7 6.0* 5.8* 7.1  ALBUMIN 4.7  4.6 3.9 3.5  3.3* 3.7   Recent Labs  Lab 02/20/20 1030  LIPASE 801*   No results for input(s): AMMONIA in the last 168 hours. CBC: Recent Labs  Lab 02/20/20 1030 02/21/20 0415 02/21/20 1440 02/22/20 0727 02/23/20 0920  WBC 7.5 5.3 4.4 3.4* 4.0  NEUTROABS 6.3 3.5  --   --   --   HGB 14.7 13.3 11.6* 10.4* 12.1  HCT 44.6 38.1 33.1* 30.2* 35.3*  MCV 99.6 96.2 96.2 95.9 98.9  PLT 130* 109* 89* 95* 131*   Cardiac Enzymes: Recent Labs  Lab 02/20/20 1030  CKTOTAL 54   BNP: Invalid input(s): POCBNP CBG: Recent Labs  Lab 02/22/20 0341 02/22/20 1230 02/22/20 1538 02/22/20 1939 02/22/20 2329  GLUCAP 99 145* 96 167* 112*   D-Dimer No results for input(s): DDIMER in the last 72 hours. Hgb A1c No results for input(s): HGBA1C in the last 72 hours. Lipid Profile No results for input(s): CHOL, HDL, LDLCALC, TRIG, CHOLHDL, LDLDIRECT in the last 72 hours. Thyroid function studies No results for input(s): TSH, T4TOTAL, T3FREE, THYROIDAB in the last 72 hours.  Invalid input(s): FREET3 Anemia work up No results for input(s): VITAMINB12, FOLATE, FERRITIN, TIBC, IRON, RETICCTPCT in the last 72 hours. Urinalysis    Component Value Date/Time   COLORURINE YELLOW 02/20/2020 0713   APPEARANCEUR HAZY (A) 02/20/2020 0713   LABSPEC 1.024 02/20/2020 0713   PHURINE 6.0 02/20/2020 0713   GLUCOSEU NEGATIVE 02/20/2020 0713   HGBUR MODERATE (A) 02/20/2020 0713   BILIRUBINUR NEGATIVE 02/20/2020 0713   KETONESUR 80 (A) 02/20/2020 0713   PROTEINUR >=300 (A) 02/20/2020 0713   UROBILINOGEN 0.2 06/10/2015 1150   NITRITE NEGATIVE 02/20/2020 0713   LEUKOCYTESUR NEGATIVE 02/20/2020 0713   Sepsis Labs Invalid input(s): PROCALCITONIN,  WBC,  LACTICIDVEN Microbiology Recent Results (from the past 240 hour(s))  SARS Coronavirus 2 by RT PCR (hospital order, performed in [facility] hospital lab) Nasopharyngeal Nasopharyngeal Swab     Status: None  Collection Time: 02/20/20  5:29 AM   Specimen:  Nasopharyngeal Swab  Result Value Ref Range Status   SARS Coronavirus 2 NEGATIVE NEGATIVE Final    Comment: (NOTE) SARS-CoV-2 target nucleic acids are NOT DETECTED.  The SARS-CoV-2 RNA is generally detectable in upper and lower respiratory specimens during the acute phase of infection. The lowest concentration of SARS-CoV-2 viral copies this assay can detect is 250 copies / mL. A negative result does not preclude SARS-CoV-2 infection and should not be used as the sole basis for treatment or other patient management decisions.  A negative result may occur with improper specimen collection / handling, submission of specimen other than nasopharyngeal swab, presence of viral mutation(s) within the areas targeted by this assay, and inadequate number of viral copies (<250 copies / mL). A negative result must be combined with clinical observations, patient history, and epidemiological information.  Fact Sheet for Patients:   BoilerBrush.com.cy  Fact Sheet for Healthcare Providers: https://pope.com/  This test is not yet approved or  cleared by the [place] FDA and has been authorized for detection and/or diagnosis of SARS-CoV-2 by FDA under an Emergency Use Authorization (EUA).  This EUA will remain in effect (meaning this test can be used) for the duration of the COVID-19 declaration under Section 564(b)(1) of the Act, 21 U.S.C. section 360bbb-3(b)(1), unless the authorization is terminated or revoked sooner.  Performed at [facility], [address] [place], [place] [address]      Time coordinating discharge: 45 minutes The [place] controlled substances registry was reviewed for this patient       SIGNED:   Alberteen Sam, MD  [facility] 02/23/2020, 5:15 PM

## 2020-02-23 NOTE — Progress Notes (Signed)
Patient has been discharged from the unit via wheelchair. Midline has been removed and line is intact. Tele has been disconnected. AVS documentation has been given and reviewed. Pt reports no pain. Pt sent in good spirits.

## 2020-03-08 ENCOUNTER — Other Ambulatory Visit: Payer: Self-pay

## 2020-03-08 ENCOUNTER — Encounter (HOSPITAL_COMMUNITY): Payer: Self-pay | Admitting: Emergency Medicine

## 2020-03-08 ENCOUNTER — Emergency Department (HOSPITAL_COMMUNITY)
Admission: EM | Admit: 2020-03-08 | Discharge: 2020-03-09 | Disposition: A | Discharge status: Home / Self Care | Payer: Medicaid Other | Attending: Emergency Medicine | Admitting: Emergency Medicine

## 2020-03-08 DIAGNOSIS — Z5321 Procedure and treatment not carried out due to patient leaving prior to being seen by health care provider: Secondary | ICD-10-CM | POA: Insufficient documentation

## 2020-03-08 DIAGNOSIS — R110 Nausea: Secondary | ICD-10-CM | POA: Insufficient documentation

## 2020-03-08 DIAGNOSIS — K859 Acute pancreatitis without necrosis or infection, unspecified: Secondary | ICD-10-CM | POA: Diagnosis not present

## 2020-03-08 LAB — URINALYSIS, ROUTINE W REFLEX MICROSCOPIC
Bacteria, UA: NONE SEEN
Bilirubin Urine: NEGATIVE
Glucose, UA: NEGATIVE mg/dL
Ketones, ur: NEGATIVE mg/dL
Leukocytes,Ua: NEGATIVE
Nitrite: NEGATIVE
Protein, ur: NEGATIVE mg/dL
Specific Gravity, Urine: 1.009 (ref 1.005–1.030)
pH: 7 (ref 5.0–8.0)

## 2020-03-08 LAB — COMPREHENSIVE METABOLIC PANEL
ALT: 34 U/L (ref 0–44)
AST: 26 U/L (ref 15–41)
Albumin: 4.4 g/dL (ref 3.5–5.0)
Alkaline Phosphatase: 91 U/L (ref 38–126)
Anion gap: 13 (ref 5–15)
BUN: 8 mg/dL (ref 6–20)
CO2: 25 mmol/L (ref 22–32)
Calcium: 8.9 mg/dL (ref 8.9–10.3)
Chloride: 108 mmol/L (ref 98–111)
Creatinine, Ser: 0.67 mg/dL (ref 0.44–1.00)
GFR calc Af Amer: >60 mL/min (ref >60)
GFR calc non Af Amer: >60 mL/min (ref >60)
Glucose, Bld: 91 mg/dL (ref 70–99)
Potassium: 3.9 mmol/L (ref 3.5–5.1)
Sodium: 146 mmol/L — ABNORMAL HIGH (ref 135–145)
Total Bilirubin: 0.4 mg/dL (ref 0.3–1.2)
Total Protein: 8.2 g/dL — ABNORMAL HIGH (ref 6.5–8.1)

## 2020-03-08 LAB — LIPASE, BLOOD: Lipase: 55 U/L — ABNORMAL HIGH (ref 11–51)

## 2020-03-08 LAB — CBC
HCT: 41.8 % (ref 36.0–46.0)
Hemoglobin: 13.8 g/dL (ref 12.0–15.0)
MCH: 33.1 pg (ref 26.0–34.0)
MCHC: 33 g/dL (ref 30.0–36.0)
MCV: 100.2 fL — ABNORMAL HIGH (ref 80.0–100.0)
Platelets: 389 10*3/uL (ref 150–400)
RBC: 4.17 MIL/uL (ref 3.87–5.11)
RDW: 13.5 % (ref 11.5–15.5)
WBC: 10.5 10*3/uL (ref 4.0–10.5)
nRBC: 0 % (ref 0.0–0.2)

## 2020-03-08 LAB — I-STAT BETA HCG BLOOD, ED (MC, WL, AP ONLY): I-stat hCG, quantitative: <5 m[IU]/mL (ref <5)

## 2020-03-08 MED ORDER — SODIUM CHLORIDE 0.9% FLUSH: 3.0000 mL | Freq: Once | INTRAVENOUS | Status: DC

## 2020-03-08 MED ORDER — ONDANSETRON 4 MG PO TBDP
4.0000 mg | ORAL_TABLET | Freq: Once | ORAL | Status: AC | PRN
  Administered 2020-03-08: 4 mg via ORAL
  Filled 2020-03-08: qty 1

## 2020-03-08 NOTE — ED Triage Notes (Signed)
Patient states she is still having pain and nausea from her pancreatitis. Patient was admitted at [facility] for same symptoms.

## 2020-03-09 ENCOUNTER — Emergency Department (HOSPITAL_COMMUNITY)
Admission: EM | Admit: 2020-03-09 | Discharge: 2020-03-10 | Disposition: A | Discharge status: Home / Self Care | Payer: Medicaid Other | Source: Home / Self Care | Attending: Emergency Medicine | Admitting: Emergency Medicine

## 2020-03-09 ENCOUNTER — Other Ambulatory Visit: Payer: Self-pay

## 2020-03-09 ENCOUNTER — Encounter (HOSPITAL_COMMUNITY): Payer: Self-pay | Admitting: Emergency Medicine

## 2020-03-09 DIAGNOSIS — K852 Alcohol induced acute pancreatitis without necrosis or infection: Secondary | ICD-10-CM

## 2020-03-09 LAB — COMPREHENSIVE METABOLIC PANEL
ALT: 28 U/L (ref 0–44)
AST: 24 U/L (ref 15–41)
Albumin: 3.6 g/dL (ref 3.5–5.0)
Alkaline Phosphatase: 92 U/L (ref 38–126)
Anion gap: 12 (ref 5–15)
BUN: 6 mg/dL (ref 6–20)
CO2: 22 mmol/L (ref 22–32)
Calcium: 8.6 mg/dL — ABNORMAL LOW (ref 8.9–10.3)
Chloride: 106 mmol/L (ref 98–111)
Creatinine, Ser: 0.8 mg/dL (ref 0.44–1.00)
GFR calc Af Amer: >60 mL/min (ref >60)
GFR calc non Af Amer: >60 mL/min (ref >60)
Glucose, Bld: 128 mg/dL — ABNORMAL HIGH (ref 70–99)
Potassium: 3.8 mmol/L (ref 3.5–5.1)
Sodium: 140 mmol/L (ref 135–145)
Total Bilirubin: 0.4 mg/dL (ref 0.3–1.2)
Total Protein: 7.2 g/dL (ref 6.5–8.1)

## 2020-03-09 LAB — CBC
HCT: 38 % (ref 36.0–46.0)
Hemoglobin: 12.3 g/dL (ref 12.0–15.0)
MCH: 32.1 pg (ref 26.0–34.0)
MCHC: 32.4 g/dL (ref 30.0–36.0)
MCV: 99.2 fL (ref 80.0–100.0)
Platelets: 344 10*3/uL (ref 150–400)
RBC: 3.83 MIL/uL — ABNORMAL LOW (ref 3.87–5.11)
RDW: 13.3 % (ref 11.5–15.5)
WBC: 7.7 10*3/uL (ref 4.0–10.5)
nRBC: 0 % (ref 0.0–0.2)

## 2020-03-09 LAB — LIPASE, BLOOD: Lipase: 65 U/L — ABNORMAL HIGH (ref 11–51)

## 2020-03-09 LAB — I-STAT BETA HCG BLOOD, ED (MC, WL, AP ONLY): I-stat hCG, quantitative: <5 m[IU]/mL (ref <5)

## 2020-03-09 MED ORDER — SODIUM CHLORIDE 0.9% FLUSH
3.0000 mL | Freq: Once | INTRAVENOUS | Status: AC
  Administered 2020-03-10: 3 mL via INTRAVENOUS

## 2020-03-09 NOTE — ED Triage Notes (Signed)
Pt reports she was diagnosed with pancreatitis x 3 weeks ago. Reports abdominal pain after ETOH use x 2 days.

## 2020-03-09 NOTE — ED Notes (Addendum)
No answer from patient x 1.

## 2020-03-09 NOTE — ED Triage Notes (Signed)
No answer from pt when called for room x 3.  

## 2020-03-09 NOTE — ED Notes (Signed)
No call from patient when called x 2.

## 2020-03-10 MED ORDER — ONDANSETRON 4 MG PO TBDP
4.0000 mg | ORAL_TABLET | Freq: Once | ORAL | Status: AC
  Administered 2020-03-10: 4 mg via ORAL
  Filled 2020-03-10: qty 1

## 2020-03-10 MED ORDER — THIAMINE HCL 100 MG/ML IJ SOLN
100.0000 mg | Freq: Once | INTRAMUSCULAR | Status: AC
  Administered 2020-03-10: 100 mg via INTRAVENOUS
  Filled 2020-03-10: qty 2

## 2020-03-10 MED ORDER — FOLIC ACID 1 MG PO TABS
1.0000 mg | ORAL_TABLET | Freq: Once | ORAL | Status: AC
  Administered 2020-03-10: 1 mg via ORAL
  Filled 2020-03-10: qty 1

## 2020-03-10 MED ORDER — ONDANSETRON 4 MG PO TBDP
4.0000 mg | ORAL_TABLET | Freq: Three times a day (TID) | ORAL | 0 refills | Status: DC | PRN
Start: 2020-03-10 — End: 2020-05-08

## 2020-03-10 MED ORDER — SODIUM CHLORIDE 0.9 % IV BOLUS
1000.0000 mL | Freq: Once | INTRAVENOUS | Status: AC
  Administered 2020-03-10: 1000 mL via INTRAVENOUS

## 2020-03-10 MED ORDER — FENTANYL CITRATE (PF) 100 MCG/2ML IJ SOLN: 50.0000 ug | Freq: Once | INTRAMUSCULAR | Status: DC

## 2020-03-10 NOTE — ED Provider Notes (Signed)
[facility]   CSN: [id number] Arrival date & time: 03/09/20  2137     History Chief Complaint  Patient presents with  . Abdominal Pain    Michele Rich is a 34 y.o. female history of alcoholism, pancreatitis, depression.  Patient presents today for abdominal pain and nausea onset around 3 weeks ago.  She reports that she was admitted to the hospital 2-3 weeks ago for the same and was diagnosed with pancreatitis.  She reports that her symptoms have never truly resolved and she has had lingering abdominal pain and nausea since that time.  She describes abdominal pain as a generalized moderate intensity aching sensation constant nonradiating no clear alleviating factors.  She reports that 2 days ago she drank some beer in the hopes of helping her symptoms however this only aggravated her nausea and abdominal pain.  She denies any recent vomiting but reports decreased p.o. intake due to constant nausea.  Denies fever/chills, chest pain/shortness of breath, cough, vomiting, diarrhea, dysuria/hematuria, fall/injury, seizures, diaphoresis, hallucinations or any additional concerns.  HPI     Past Medical History:  Diagnosis Date  . Alcoholism (HCC)   . HPV in female     Patient Active Problem List   Diagnosis Date Noted  . Alcohol withdrawal syndrome without complication (HCC) 02/20/2020  . Intractable nausea and vomiting 02/20/2020  . Epigastric abdominal tenderness without rebound tenderness 02/20/2020  . Pancreatitis 02/20/2020  . MDD (major depressive disorder) 01/04/2019  . Alcohol dependence with alcohol-induced mood disorder (HCC) 01/04/2019    Past Surgical History:  Procedure Laterality Date  . NO PAST SURGERIES       OB History    Gravida  3   Para  2   Term  2   Preterm      AB  1   Living  2     SAB      TAB      Ectopic      Multiple      Live Births              Family History    Family history unknown: Yes    Social History   Tobacco Use  . Smoking status: Current Every Day Smoker    Packs/day: 0.10    Types: Cigarettes  . Smokeless tobacco: Never Used  Vaping Use  . Vaping Use: Never used  Substance Use Topics  . Alcohol use: Yes    Comment: daily  . Drug use: No    Home Medications Prior to Admission medications   Medication Sig Start Date End Date Taking? Authorizing Provider  folic acid (FOLVITE) 1 MG tablet Take 1 tablet (1 mg total) by mouth daily. 02/24/20  Yes Danford, Earl Liteshristopher P, MD  gabapentin (NEURONTIN) 100 MG capsule Take 1 capsule (100 mg total) by mouth 3 (three) times daily. 02/23/20  Yes Danford, Earl Liteshristopher P, MD  potassium chloride (KLOR-CON) 10 MEQ tablet Take 1 tablet (10 mEq total) by mouth at bedtime. 02/23/20  Yes Danford, Earl Liteshristopher P, MD  thiamine 100 MG tablet Take 1 tablet (100 mg total) by mouth daily. 02/24/20  Yes Danford, Earl Liteshristopher P, MD  ondansetron (ZOFRAN ODT) 4 MG disintegrating tablet Take 1 tablet (4 mg total) by mouth every 8 (eight) hours as needed for nausea or vomiting. 03/10/20   Harlene SaltsMorelli, Twilia Yaklin A, PA-C    Allergies    Apple fruit extract  Review of Systems   Review of Systems  Ten systems are reviewed and are negative for acute change except as noted in the HPI  Physical Exam Updated Vital Signs BP 114/80   Pulse 87   Temp 98.4 F (36.9 C) (Oral)   Resp 16   Ht 5\' 5"  (1.651 m)   Wt 81.6 kg   SpO2 98%   BMI 29.95 kg/m   Physical Exam Constitutional:      General: She is not in acute distress.    Appearance: Normal appearance. She is well-developed. She is not ill-appearing or diaphoretic.  HENT:     Head: Normocephalic and atraumatic.  Eyes:     General: Vision grossly intact. Gaze aligned appropriately.     Pupils: Pupils are equal, round, and reactive to light.  Neck:     Trachea: Trachea and phonation normal.  Cardiovascular:     Rate and Rhythm: Normal rate and regular rhythm.   Pulmonary:     Effort: Pulmonary effort is normal. No respiratory distress.     Breath sounds: Normal breath sounds.  Abdominal:     General: There is no distension.     Palpations: Abdomen is soft.     Tenderness: There is generalized abdominal tenderness. There is no guarding or rebound.  Musculoskeletal:        General: Normal range of motion.     Cervical back: Normal range of motion.  Skin:    General: Skin is warm and dry.  Neurological:     Mental Status: She is alert.     GCS: GCS eye subscore is 4. GCS verbal subscore is 5. GCS motor subscore is 6.     Comments: Speech is clear and goal oriented, follows commands Major Cranial nerves without deficit, no facial droop Moves extremities without ataxia, coordination intact  Psychiatric:        Behavior: Behavior normal.     ED Results / Procedures / Treatments   Labs (all labs ordered are listed, but only abnormal results are displayed) Labs Reviewed  LIPASE, BLOOD - Abnormal; Notable for the following components:      Result Value   Lipase 65 (*)    All other components within normal limits  COMPREHENSIVE METABOLIC PANEL - Abnormal; Notable for the following components:   Glucose, Bld 128 (*)    Calcium 8.6 (*)    All other components within normal limits  CBC - Abnormal; Notable for the following components:   RBC 3.83 (*)    All other components within normal limits  I-STAT BETA HCG BLOOD, ED ([facility], [facility], [facility] ONLY)    EKG EKG Interpretation  Date/Time:  Sunday March 09 2020 21:42:28 EDT Ventricular Rate:  117 PR Interval:  134 QRS Duration: 78 QT Interval:  328 QTC Calculation: 457 R Axis:   91 Text Interpretation: Sinus tachycardia Rightward axis Borderline ECG No STEMI Confirmed by 1970 [phone]) on 03/10/2020 7:04:22 AM   Radiology No results found.  Procedures Procedures (including critical care time)  Medications Ordered in ED Medications  sodium chloride flush (NS) 0.9 % injection 3 mL  (3 mLs Intravenous Given 03/10/20 0704)  thiamine (B-1) injection 100 mg (100 mg Intravenous Given 03/10/20 0702)  folic acid (FOLVITE) tablet 1 mg (1 mg Oral Given 03/10/20 0701)  sodium chloride 0.9 % bolus 1,000 mL (0 mLs Intravenous Stopped 03/10/20 0818)  ondansetron (ZOFRAN-ODT) disintegrating tablet 4 mg (4 mg Oral Given 03/10/20 03/12/20)    ED Course  I have reviewed the triage vital signs and the  nursing notes.  Pertinent labs & imaging results that were available during my care of the patient were reviewed by me and considered in my medical decision making (see chart for details).    MDM Rules/Calculators/A&P                          Additional history obtained from: 1. Nursing notes from this visit. 2. Electronic medical record reviewed.  Patient was admitted between July 8 and February 23, 2020 for acute alcoholic pancreatitis.  At that time she had presented with epigastric pain vomiting and a lipase over 800.  There is pancreatic edema seen on CT scan.  It is reported she used up to 1/2 gallon of liquor every few days.  She improved following fluids and bowel rest.  She also had alcoholic ketoacidosis and uncomplicated withdrawal during that visit.  Ultrasound of the abdomen on July 7 showed hepatic steatosis, no gallstones. -------------------------------------------- 34 year old female presents today for nausea and abdominal pain worsening over last 2 days since she tried to treat her pancreatitis with beer.  On examination she is nontoxic appearing and in no acute distress vital signs stable on the monitor.  She has been in the emergency department around 10 hours at this time, no evidence of withdrawal.  Labs obtained in triage reviewed below.  I ordered, reviewed and interpreted labs which include: Pregnancy test negative. Lipase 65, increased from 55 two days ago. CBC shows no leukocytosis or anemia. CMP shows no emergent electrolyte derangement, AKI, LFT elevation or gap.  EKG:  Sinus tachycardia Rightward axis Borderline ECG No STEMI Confirmed by Alvester Chou [phone]) on 03/10/2020 7:04:22 AM ---------------------------- Patient reassessed she is sleeping he is arousable to voice.  Reports symptoms have greatly improved following ODT Zofran.  She has tolerated p.o. fluids here without difficulty, she is requesting discharge.  Will discharge with ODT Zofran and encourage patient to stop using alcohol.  Suspect symptoms today are secondary to alcoholic pancreatitis.  Given no gallstones were seen on ultrasound during last admission low suspicion for cholelithiasis as cause of her symptoms today.  Doubt doubt appendicitis, cholecystitis, perforation, SBO, pancreatic abscess/infection or other emergent pathologies at this time requiring CT imaging or further work-up.  Encourage patient to maintain hydration and follow-up with her PCP.  Additionally no evidence of withdrawal on reassessment she is well-appearing in no acute distress.  Additionally patient did not provide urinalysis today, she denies any dysuria/hematuria to suggest UTI, she has no flank pain to suggest kidney stone, shared decision making made with patient and urinalysis was canceled.  Additionally no indication for pelvic examination, patient agrees today she denies any concern for STI or pelvic pain.  At this time there does not appear to be any evidence of an acute emergency medical condition and the patient appears stable for discharge with appropriate outpatient follow up. Diagnosis was discussed with patient who verbalizes understanding of care plan and is agreeable to discharge. I have discussed return precautions with patient who verbalizes understanding. Patient encouraged to follow-up with their PCP. All questions answered.  Patient's case discussed with Dr. Renaye Rakers who agrees with plan to discharge with Zofran and outpatient follow-up.   Note: Portions of this report may have been transcribed using voice  recognition software. Every effort was made to ensure accuracy; however, inadvertent computerized transcription errors may still be present. Final Clinical Impression(s) / ED Diagnoses Final diagnoses:  Alcohol-induced acute pancreatitis, unspecified complication status  Rx / DC Orders ED Discharge Orders         Ordered    ondansetron (ZOFRAN ODT) 4 MG disintegrating tablet  Every 8 hours PRN     Discontinue  Reprint     03/10/20 0928           Bill Salinas, PA-C 03/10/20 0539    Terald Sleeper, MD 03/10/20 1742

## 2020-03-10 NOTE — Discharge Instructions (Addendum)
At this time there does not appear to be the presence of an emergent medical condition, however there is always the potential for conditions to change. Please read and follow the below instructions.  Please return to the Emergency Department immediately for any new or worsening symptoms or if your symptoms do not improve in the next 2 days. Please be sure to follow up with your Primary Care Provider within one week regarding your visit today; please call their office to schedule an appointment even if you are feeling better for a follow-up visit. You may use the medication Zofran as instructed to help with nausea and vomiting.  This medication will dissolve under your tongue.  Get help right away if: You cannot eat or keep fluids down. Your pain gets very bad. Your skin or the white part of your eyes turns yellow. You have sudden swelling in your belly. You throw up. You feel dizzy or you pass out (faint). You have fever or chills You have any new/concerning or worsening of symptoms  Please read the additional information packets attached to your discharge summary.  Do not take your medicine if  develop an itchy rash, swelling in your mouth or lips, or difficulty breathing; call 911 and seek immediate emergency medical attention if this occurs.  You may review your lab tests and imaging results in their entirety on your MyChart account.  Please discuss all results of fully with your primary care provider and other specialist at your follow-up visit.  Note: Portions of this text may have been transcribed using voice recognition software. Every effort was made to ensure accuracy; however, inadvertent computerized transcription errors may still be present.

## 2020-03-11 ENCOUNTER — Telehealth (INDEPENDENT_AMBULATORY_CARE_PROVIDER_SITE_OTHER): Payer: Medicaid Other | Admitting: Primary Care

## 2020-04-17 ENCOUNTER — Other Ambulatory Visit: Payer: Self-pay

## 2020-04-17 ENCOUNTER — Telehealth (INDEPENDENT_AMBULATORY_CARE_PROVIDER_SITE_OTHER): Payer: Medicaid Other | Admitting: Primary Care

## 2020-04-17 DIAGNOSIS — Z7689 Persons encountering health services in other specified circumstances: Secondary | ICD-10-CM

## 2020-04-17 NOTE — Progress Notes (Signed)
IVirtual Visit  I connected with Michele Rich on 04/17/20 at 10:50 AM EDT by telephone and verified that I am speaking with the correct person using two identifiers.  Patient is sitting in the parking lot at [employer] I discussed the limitations, risks, security and privacy concerns of performing an evaluation and management service by telephone and the availability of in person appointments. I also discussed with the patient that there may be a patient responsible charge related to this service. The patient expressed understanding and agreed to proceed. Gwinda Passe Np-C at office  History of Present Illness: Ms.Michele Rich is a 34 year old female with a past medical history of alcohol use.  She is establishing care and we briefly discussed how much and the factors causing her to drink alcohol.  Behavior is a form coping mechanism. Past Medical History:  Diagnosis Date  . Alcoholism (HCC)   . HPV in female    Current Outpatient Medications on File Prior to Visit  Medication Sig Dispense Refill  . folic acid (FOLVITE) 1 MG tablet Take 1 tablet (1 mg total) by mouth daily. 30 tablet 2  . gabapentin (NEURONTIN) 100 MG capsule Take 1 capsule (100 mg total) by mouth 3 (three) times daily. 90 capsule 1  . ondansetron (ZOFRAN ODT) 4 MG disintegrating tablet Take 1 tablet (4 mg total) by mouth every 8 (eight) hours as needed for nausea or vomiting. 5 tablet 0  . potassium chloride (KLOR-CON) 10 MEQ tablet Take 1 tablet (10 mEq total) by mouth at bedtime. 14 tablet 0  . thiamine 100 MG tablet Take 1 tablet (100 mg total) by mouth daily. 30 tablet 2   No current facility-administered medications on file prior to visit.    .Observations/Objective Review of Systems  All other systems reviewed and are negative.  Assessment and Plan: Diagnoses and all orders for this visit:  Encounter to establish care Gwinda Passe, NP-C will be your  (PCP) she is mastered prepared . She is skilled to  diagnosed and treat illness. Also able to answer health concern as well as continuing care of varied medical conditions, not limited by cause, organ system, or diagnosis.  Expressed to patient wanting her to have a follow-up in person , a pap for her to schedule.  And to address alcohol use    Follow Up Instructions:    I discussed the assessment and treatment plan with the patient. The patient was provided an opportunity to ask questions and all were answered. The patient agreed with the plan and demonstrated an understanding of the instructions.   The patient was advised to call back or seek an in-person evaluation if the symptoms worsen or if the condition fails to improve as anticipated.  I provided 15 minutes of non-face-to-face time during this encounter.  This includes reviewing previous encounters.  Imaging, blood work/labs   Grayce Sessions, NP

## 2020-04-28 ENCOUNTER — Ambulatory Visit (INDEPENDENT_AMBULATORY_CARE_PROVIDER_SITE_OTHER): Payer: Medicaid Other | Admitting: Primary Care

## 2020-04-28 ENCOUNTER — Other Ambulatory Visit (HOSPITAL_COMMUNITY)
Admission: RE | Admit: 2020-04-28 | Discharge: 2020-04-28 | Disposition: A | Discharge status: Home / Self Care | Payer: Medicaid Other | Source: Ambulatory Visit | Attending: Primary Care | Admitting: Primary Care

## 2020-04-28 ENCOUNTER — Encounter (INDEPENDENT_AMBULATORY_CARE_PROVIDER_SITE_OTHER): Payer: Self-pay | Admitting: Primary Care

## 2020-04-28 ENCOUNTER — Other Ambulatory Visit: Payer: Self-pay

## 2020-04-28 VITALS — BP 138/96 | HR 75 | Temp 98.2°F | Ht 64.0 in | Wt 174.2 lb

## 2020-04-28 DIAGNOSIS — Z124 Encounter for screening for malignant neoplasm of cervix: Secondary | ICD-10-CM | POA: Insufficient documentation

## 2020-04-28 DIAGNOSIS — Z113 Encounter for screening for infections with a predominantly sexual mode of transmission: Secondary | ICD-10-CM

## 2020-04-28 DIAGNOSIS — F1024 Alcohol dependence with alcohol-induced mood disorder: Secondary | ICD-10-CM | POA: Diagnosis not present

## 2020-04-28 DIAGNOSIS — Z01419 Encounter for gynecological examination (general) (routine) without abnormal findings: Secondary | ICD-10-CM | POA: Diagnosis not present

## 2020-04-28 DIAGNOSIS — Z0000 Encounter for general adult medical examination without abnormal findings: Secondary | ICD-10-CM

## 2020-04-28 NOTE — Patient Instructions (Signed)

## 2020-04-28 NOTE — Progress Notes (Signed)
Subjective:     Michele Rich is a 34 y.o. female and is here for a comprehensive physical exam. The patient reports Pt states she is vomiting daily .She still has side pain-  induced pancreatitis. Irregular  menstrual cycles.  Social History   Socioeconomic History  . Marital status: Single    Spouse name: Not on file  . Number of children: Not on file  . Years of education: Not on file  . Highest education level: Not on file  Occupational History  . Occupation: Human resources officer at [employer]  . Smoking status: Current Every Day Smoker    Packs/day: 0.10    Types: Cigarettes  . Smokeless tobacco: Never Used  Vaping Use  . Vaping Use: Never used  Substance and Sexual Activity  . Alcohol use: Yes    Comment: daily  . Drug use: No  . Sexual activity: Yes    Birth control/protection: None  Other Topics Concern  . Not on file  Social History Narrative  . Not on file   Social Determinants of Health   Financial Resource Strain:   . Difficulty of Paying Living Expenses: Not on file  Food Insecurity:   . Worried About Programme researcher, broadcasting/film/video in the Last Year: Not on file  . Ran Out of Food in the Last Year: Not on file  Transportation Needs:   . Lack of Transportation (Medical): Not on file  . Lack of Transportation (Non-Medical): Not on file  Physical Activity:   . Days of Exercise per Week: Not on file  . Minutes of Exercise per Session: Not on file  Stress:   . Feeling of Stress : Not on file  Social Connections:   . Frequency of Communication with Friends and Family: Not on file  . Frequency of Social Gatherings with Friends and Family: Not on file  . Attends Religious Services: Not on file  . Active Member of Clubs or Organizations: Not on file  . Attends Banker Meetings: Not on file  . Marital Status: Not on file  Intimate Partner Violence:   . Fear of Current or Ex-Partner: Not on file  . Emotionally Abused: Not on file  . Physically  Abused: Not on file  . Sexually Abused: Not on file    Review of Systems Pertinent items noted in HPI and remainder of comprehensive ROS otherwise negative.   Objective:  BP (!) 145/97 (BP Location: Right Arm, Patient Position: Sitting, Cuff Size: Normal)   Pulse 83   Temp 98.2 F (36.8 C) (Oral)   Ht 5\' 4"  (1.626 m)   Wt 174 lb 3.2 oz (79 kg)   LMP 03/20/2020 (Approximate)   SpO2 96%   BMI 29.90 kg/m   Assessment:   Healthy over weight  female exam.  CONSTITUTIONAL: Well-developed, well-nourished female in no acute distress.  HENT:  Normocephalic, atraumatic, External right and left ear normal. Oropharynx is clear and moist EYES: Conjunctivae and EOM are normal. Pupils are equal, round, and reactive to light. No scleral icterus.  NECK: Normal range of motion, supple, no masses.  Normal thyroid.  SKIN: Skin is warm and dry. No rash noted. Not diaphoretic. No erythema. No pallor. NEUROLGIC: Alert and oriented to person, place, and time. Normal reflexes, muscle tone coordination. No cranial nerve deficit noted. PSYCHIATRIC: Normal mood and affect. Normal behavior. Normal judgment and thought content. CARDIOVASCULAR: Normal heart rate noted, regular rhythm RESPIRATORY: Clear to auscultation bilaterally. Effort and breath sounds  normal, no problems with respiration noted. BREASTS: Taught SBE ABDOMEN: Soft, normal bowel sounds, no distention noted.  No tenderness, rebound or guarding.  PELVIC: Normal appearing external genitalia; normal appearing vaginal mucosa and cervix.  No abnormal discharge noted.  Pap smear obtained.  Normal uterine size, no other palpable masses, no uterine or adnexal tenderness. MUSCULOSKELETAL: Normal range of motion. No tenderness.  No cyanosis, clubbing, or edema.  2+ distal pulses.   Plan:   Michele Rich was seen today for gynecologic exam.  Diagnoses and all orders for this visit:  Encounter for gynecological examination without abnormal finding -      Cytology - PAP(Michele Rich) -     Cervicovaginal ancillary only  Cervical cancer screening -     Cytology - PAP(Michele Rich) -     Cervicovaginal ancillary only  Screening for STD (sexually transmitted disease) -     Cytology - PAP(Michele Rich) -     Cervicovaginal ancillary only  Alcohol dependence with alcohol-induced mood disorder (HCC) Admits to having a beer first thing in the morning and did beer labs or the entire day will just sipping on it.  This has become more so of a habit and a fear  if she does not have alcohol she will have a seizure.  The more we discussed this in more it became apparent more psychological.  Patient has agreed to change alcoholic beer to non alcohol beer to mimic the same habit/behavior   See After Visit Summary for Counseling Recommendations

## 2020-04-28 NOTE — Progress Notes (Signed)
Pt states she is vomiting daily  She still has side pain- was seen in ED for alcohol induced pancreatitis  Pt has been having menstrual bleeding constantly

## 2020-04-29 ENCOUNTER — Other Ambulatory Visit (INDEPENDENT_AMBULATORY_CARE_PROVIDER_SITE_OTHER): Payer: Self-pay | Admitting: Primary Care

## 2020-04-29 DIAGNOSIS — N760 Acute vaginitis: Secondary | ICD-10-CM

## 2020-04-29 DIAGNOSIS — B9689 Other specified bacterial agents as the cause of diseases classified elsewhere: Secondary | ICD-10-CM

## 2020-04-29 DIAGNOSIS — A599 Trichomoniasis, unspecified: Secondary | ICD-10-CM

## 2020-04-29 LAB — CERVICOVAGINAL ANCILLARY ONLY
Bacterial Vaginitis (gardnerella): POSITIVE — AB
Candida Glabrata: NEGATIVE
Candida Vaginitis: NEGATIVE
Chlamydia: NEGATIVE
Comment: NEGATIVE
Comment: NEGATIVE
Comment: NEGATIVE
Comment: NEGATIVE
Comment: NEGATIVE
Comment: NORMAL
Neisseria Gonorrhea: NEGATIVE
Trichomonas: POSITIVE — AB

## 2020-04-29 MED ORDER — METRONIDAZOLE 500 MG PO TABS: 500.0000 mg | ORAL_TABLET | Freq: Two times a day (BID) | ORAL | 0 refills | Status: DC

## 2020-05-01 LAB — CYTOLOGY - PAP: Diagnosis: NEGATIVE

## 2020-05-07 ENCOUNTER — Emergency Department (HOSPITAL_COMMUNITY): Payer: Medicaid Other

## 2020-05-07 ENCOUNTER — Encounter (HOSPITAL_COMMUNITY): Payer: Self-pay | Admitting: Emergency Medicine

## 2020-05-07 ENCOUNTER — Other Ambulatory Visit: Payer: Self-pay

## 2020-05-07 ENCOUNTER — Emergency Department (HOSPITAL_COMMUNITY)
Admission: EM | Admit: 2020-05-07 | Discharge: 2020-05-08 | Disposition: A | Discharge status: Home / Self Care | Payer: Medicaid Other | Attending: Emergency Medicine | Admitting: Emergency Medicine

## 2020-05-07 DIAGNOSIS — D696 Thrombocytopenia, unspecified: Secondary | ICD-10-CM | POA: Insufficient documentation

## 2020-05-07 DIAGNOSIS — F17210 Nicotine dependence, cigarettes, uncomplicated: Secondary | ICD-10-CM | POA: Insufficient documentation

## 2020-05-07 DIAGNOSIS — U071 COVID-19: Secondary | ICD-10-CM | POA: Diagnosis not present

## 2020-05-07 DIAGNOSIS — R05 Cough: Secondary | ICD-10-CM | POA: Diagnosis present

## 2020-05-07 LAB — CBC WITH DIFFERENTIAL/PLATELET
Abs Immature Granulocytes: 0.02 10*3/uL (ref 0.00–0.07)
Basophils Absolute: 0 10*3/uL (ref 0.0–0.1)
Basophils Relative: 1 %
Eosinophils Absolute: 0.1 10*3/uL (ref 0.0–0.5)
Eosinophils Relative: 1 %
HCT: 41.3 % (ref 36.0–46.0)
Hemoglobin: 13.7 g/dL (ref 12.0–15.0)
Immature Granulocytes: 0 %
Lymphocytes Relative: 10 %
Lymphs Abs: 0.6 10*3/uL — ABNORMAL LOW (ref 0.7–4.0)
MCH: 32 pg (ref 26.0–34.0)
MCHC: 33.2 g/dL (ref 30.0–36.0)
MCV: 96.5 fL (ref 80.0–100.0)
Monocytes Absolute: 0.1 10*3/uL (ref 0.1–1.0)
Monocytes Relative: 2 %
Neutro Abs: 5 10*3/uL (ref 1.7–7.7)
Neutrophils Relative %: 86 %
Platelets: 76 10*3/uL — ABNORMAL LOW (ref 150–400)
RBC: 4.28 MIL/uL (ref 3.87–5.11)
RDW: 13.8 % (ref 11.5–15.5)
WBC: 5.8 10*3/uL (ref 4.0–10.5)
nRBC: 0 % (ref 0.0–0.2)

## 2020-05-07 LAB — COMPREHENSIVE METABOLIC PANEL
ALT: 71 U/L — ABNORMAL HIGH (ref 0–44)
AST: 213 U/L — ABNORMAL HIGH (ref 15–41)
Albumin: 4.1 g/dL (ref 3.5–5.0)
Alkaline Phosphatase: 103 U/L (ref 38–126)
Anion gap: 13 (ref 5–15)
BUN: 9 mg/dL (ref 6–20)
CO2: 22 mmol/L (ref 22–32)
Calcium: 8.7 mg/dL — ABNORMAL LOW (ref 8.9–10.3)
Chloride: 101 mmol/L (ref 98–111)
Creatinine, Ser: 0.75 mg/dL (ref 0.44–1.00)
GFR calc Af Amer: >60 mL/min (ref >60)
GFR calc non Af Amer: >60 mL/min (ref >60)
Glucose, Bld: 95 mg/dL (ref 70–99)
Potassium: 3.9 mmol/L (ref 3.5–5.1)
Sodium: 136 mmol/L (ref 135–145)
Total Bilirubin: 0.6 mg/dL (ref 0.3–1.2)
Total Protein: 7.6 g/dL (ref 6.5–8.1)

## 2020-05-07 LAB — I-STAT BETA HCG BLOOD, ED (MC, WL, AP ONLY): I-stat hCG, quantitative: <5 m[IU]/mL (ref <5)

## 2020-05-07 LAB — TROPONIN I (HIGH SENSITIVITY): Troponin I (High Sensitivity): <2 ng/L (ref <18)

## 2020-05-07 LAB — SARS CORONAVIRUS 2 BY RT PCR (HOSPITAL ORDER, PERFORMED IN ~~LOC~~ HOSPITAL LAB): SARS Coronavirus 2: POSITIVE — AB

## 2020-05-07 MED ORDER — ONDANSETRON HCL 4 MG/2ML IJ SOLN
4.0000 mg | Freq: Once | INTRAMUSCULAR | Status: AC
  Administered 2020-05-07: 4 mg via INTRAVENOUS
  Filled 2020-05-07: qty 2

## 2020-05-07 MED ORDER — SODIUM CHLORIDE 0.9 % IV BOLUS
1000.0000 mL | Freq: Once | INTRAVENOUS | Status: AC
  Administered 2020-05-07: 1000 mL via INTRAVENOUS

## 2020-05-07 NOTE — ED Provider Notes (Signed)
[facility] Provider Note   CSN: [id number] Arrival date & time: 05/07/20  1902     History Chief Complaint  Patient presents with  . Cough    Alan Mulderatiania M Mcroy is a 34 y.o. female with PMH/o Alcoholism who presents for evaluation of cough, generalized body aches, generalized weakness, fatigue.  She states this is been ongoing for 2 days.  She also reports that she has had vomiting, decreased appetite.  She states she has not been able to keep anything down the last 2 days.  She started having some abdominal cramping after vomiting.  She also states that she has some chest soreness and feels like she cannot catch her breath.  Patient reports she took 2 at home Covid test that were both positive.  She states she got 1 dose of the Covid vaccine but never followed up to get her second dose.  She intermittently smokes. She has not measured any fever but has had some chills. No history of asthma. She denies any OCP use, recent immobilization, prior history of DVT/PE, recent surgery, leg swelling, or long travel.  The history is provided by the patient.       Past Medical History:  Diagnosis Date  . Alcoholism (HCC)   . HPV in female     Patient Active Problem List   Diagnosis Date Noted  . Alcohol withdrawal syndrome without complication (HCC) 02/20/2020  . Intractable nausea and vomiting 02/20/2020  . Epigastric abdominal tenderness without rebound tenderness 02/20/2020  . Pancreatitis 02/20/2020  . MDD (major depressive disorder) 01/04/2019  . Alcohol dependence with alcohol-induced mood disorder (HCC) 01/04/2019    Past Surgical History:  Procedure Laterality Date  . NO PAST SURGERIES       OB History    Gravida  3   Para  2   Term  2   Preterm      AB  1   Living  2     SAB      TAB      Ectopic      Multiple      Live Births              Family History  Family history unknown: Yes    Social History    Tobacco Use  . Smoking status: Current Every Day Smoker    Packs/day: 0.10    Types: Cigarettes  . Smokeless tobacco: Never Used  Vaping Use  . Vaping Use: Never used  Substance Use Topics  . Alcohol use: Yes    Comment: daily  . Drug use: No    Home Medications Prior to Admission medications   Medication Sig Start Date End Date Taking? Authorizing Provider  FLUoxetine (PROZAC) 20 MG capsule Take 20 mg by mouth daily.   Yes [provider]  folic acid (FOLVITE) 1 MG tablet Take 1 tablet (1 mg total) by mouth daily. 02/24/20  Yes Danford, Earl Liteshristopher P, MD  OXcarbazepine (TRILEPTAL) 150 MG tablet Take 150 mg by mouth 2 (two) times daily.   Yes [provider]  thiamine 100 MG tablet Take 1 tablet (100 mg total) by mouth daily. 02/24/20  Yes Danford, Earl Liteshristopher P, MD  gabapentin (NEURONTIN) 100 MG capsule Take 1 capsule (100 mg total) by mouth 3 (three) times daily. Patient not taking: Reported on 04/28/2020 02/23/20   Alberteen Samanford, Christopher P, MD  metroNIDAZOLE (FLAGYL) 500 MG tablet Take 1 tablet (500 mg total) by mouth 2 (two)  times daily. 04/29/20   Grayce Sessions, NP  ondansetron (ZOFRAN ODT) 4 MG disintegrating tablet Take 1 tablet (4 mg total) by mouth every 8 (eight) hours as needed for nausea. 05/08/20   Garlon Hatchet, PA-C  potassium chloride (KLOR-CON) 10 MEQ tablet Take 1 tablet (10 mEq total) by mouth at bedtime. Patient not taking: Reported on 04/28/2020 02/23/20   Alberteen Sam, MD    Allergies    Apple fruit extract  Review of Systems   Review of Systems  Constitutional: Positive for fatigue. Negative for fever.  Respiratory: Positive for cough and shortness of breath.   Cardiovascular: Positive for chest pain.  Gastrointestinal: Positive for abdominal pain, nausea and vomiting.  Genitourinary: Negative for dysuria and hematuria.  Musculoskeletal: Positive for myalgias.  Neurological: Negative for headaches.  All other systems  reviewed and are negative.   Physical Exam Updated Vital Signs BP (!) 136/100 (BP Location: Right Arm)   Pulse 93   Temp 98.4 F (36.9 C) (Oral)   Resp (!) 25   LMP 05/05/2020   SpO2 100%   Physical Exam Vitals and nursing note reviewed.  Constitutional:      Appearance: Normal appearance. She is well-developed.  HENT:     Head: Normocephalic and atraumatic.  Eyes:     General: Lids are normal. No scleral icterus.       Right eye: No discharge.        Left eye: No discharge.     Conjunctiva/sclera: Conjunctivae normal.     Pupils: Pupils are equal, round, and reactive to light.  Cardiovascular:     Rate and Rhythm: Normal rate and regular rhythm.     Pulses: Normal pulses.     Heart sounds: Normal heart sounds. No murmur heard.  No friction rub. No gallop.   Pulmonary:     Effort: Pulmonary effort is normal.     Breath sounds: Normal breath sounds.     Comments: Lungs clear to auscultation bilaterally.  Symmetric chest rise.  No wheezing, rales, rhonchi. Abdominal:     Palpations: Abdomen is soft. Abdomen is not rigid.     Tenderness: There is generalized abdominal tenderness. There is no guarding.     Comments: Abdomen is soft, non-distended. Generalized tenderness. No focal point. No rigidity, No guarding. No peritoneal signs.  Musculoskeletal:        General: Normal range of motion.     Cervical back: Full passive range of motion without pain.     Comments: BLE are symmetric in appearance without any overlying warmth, erythema, edema.   Skin:    General: Skin is warm and dry.     Capillary Refill: Capillary refill takes less than 2 seconds.  Neurological:     Mental Status: She is alert and oriented to person, place, and time.  Psychiatric:        Speech: Speech normal.        Behavior: Behavior normal.     ED Results / Procedures / Treatments   Labs (all labs ordered are listed, but only abnormal results are displayed) Labs Reviewed  SARS CORONAVIRUS 2 BY  RT PCR (HOSPITAL ORDER, PERFORMED IN [facility]) - Abnormal; Notable for the following components:      Result Value   SARS Coronavirus 2 POSITIVE (*)    All other components within normal limits  CBC WITH DIFFERENTIAL/PLATELET - Abnormal; Notable for the following components:   Platelets 76 (*)    Lymphs  Abs 0.6 (*)    All other components within normal limits  COMPREHENSIVE METABOLIC PANEL - Abnormal; Notable for the following components:   Calcium 8.7 (*)    AST 213 (*)    ALT 71 (*)    All other components within normal limits  I-STAT BETA HCG BLOOD, ED ([facility], [facility], [facility] ONLY)  TROPONIN I (HIGH SENSITIVITY)  TROPONIN I (HIGH SENSITIVITY)    EKG None  Radiology DG Chest Portable 1 View  Result Date: 05/07/2020 CLINICAL DATA:  Cough EXAM: PORTABLE CHEST 1 VIEW COMPARISON:  02/19/2020 FINDINGS: Lung volumes are small and there is minimal left basilar atelectasis. The lungs are otherwise clear. No pneumothorax or pleural effusion. Cardiac size within normal limits. No acute bone abnormality. IMPRESSION: Minimal left basilar atelectasis. Electronically Signed   By: Helyn Numbers MD   On: 05/07/2020 22:34    Procedures Procedures (including critical care time)  Medications Ordered in ED Medications  sodium chloride 0.9 % bolus 1,000 mL (0 mLs Intravenous Stopped 05/07/20 2358)  ondansetron (ZOFRAN) injection 4 mg (4 mg Intravenous Given 05/07/20 2249)    ED Course  I have reviewed the triage vital signs and the nursing notes.  Pertinent labs & imaging results that were available during my care of the patient were reviewed by me and considered in my medical decision making (see chart for details).    MDM Rules/Calculators/A&P                          34 year old female who presents for evaluation of cough, generalized body aches, fatigue, nausea/vomiting.  She got 1 dose of Covid vaccine.  Patient is afebrile, non-toxic appearing, sitting comfortably on examination  table. Vital signs reviewed and stable. Consider COVID 19 infection vs infectious etiology.  We will plan to check labs, chest x-ray, Covid test.  COVID is positive.   Troponin is negative.  CBC shows no leukocytosis.  Platelets are 76.  Review of records show that she has had thrombocytopenia before. CMP shows AST of 213, ALT of 71.  She has had some transient transaminitis in the past.  She has no focal tenderness of the right upper quadrant.  I examined concerning for hepatobiliary etiology.  CXR shows minimal basilar left atelectasis.   Discussed results with patient. Repeat abdominal exam still shows some generalized tenderness. Patient has been able to tolerate sips of ginger ale here in the ED.  Patient with no hypoxia here in the ED. At this time, patient exhibits no emergent life-threatening condition that require further evaluation in ED. Patient had ample opportunity for questions and discussion. All patient's questions were answered with full understanding. Strict return precautions discussed. Patient expresses understanding and agreement to plan.   KHOLE ARTERBURN was evaluated in Emergency Department on 05/08/2020 for the symptoms described in the history of present illness. She was evaluated in the context of the global COVID-19 pandemic, which necessitated consideration that the patient might be at risk for infection with the SARS-CoV-2 virus that causes COVID-19. Institutional protocols and algorithms that pertain to the evaluation of patients at risk for COVID-19 are in a state of rapid change based on information released by regulatory bodies including the CDC and federal and state organizations. These policies and algorithms were followed during the patient's care in the ED.  Portions of this note were generated with Scientist, clinical (histocompatibility and immunogenetics). Dictation errors may occur despite best attempts at proofreading.  Final Clinical Impression(s) / ED  Diagnoses Final diagnoses:  COVID-19   Thrombocytopenia (HCC)    Rx / DC Orders ED Discharge Orders         Ordered    ondansetron (ZOFRAN ODT) 4 MG disintegrating tablet  Every 8 hours PRN        05/08/20 0023           Maxwell Caul, PA-C 05/08/20 1745    Sabino Donovan, MD 05/08/20 [phone]

## 2020-05-07 NOTE — ED Triage Notes (Signed)
Pt c/o cough that started last night. Reports she took 2 rapid home covid tests that were positive.

## 2020-05-07 NOTE — Discharge Instructions (Addendum)
Your COVID test was positive.   Make sure you are drinking fluids and staying hydrated.   Follow up with the referred COVID clinic. I have also referred you to the Monoclonal antibody infusion clinic. They will notify you if you are eligible.   Additionally, your blood work today showed that your platelets are low. They have been low in the past. This is something that should be checked by your primary care doctor in a week or so. Your liver functions are slightly elevated too. You should limit the amount of tylenol you take. This also needs to be rechecked by your primary care doctor.   Return to the Emergency Dept for any worsening shortness of breath, chest pain, or any other worsening or concerning symptoms.

## 2020-05-08 ENCOUNTER — Telehealth: Payer: Self-pay | Admitting: Family

## 2020-05-08 MED ORDER — ONDANSETRON 4 MG PO TBDP: 4.0000 mg | ORAL_TABLET | Freq: Three times a day (TID) | ORAL | 0 refills | Status: DC | PRN

## 2020-05-08 NOTE — Telephone Encounter (Signed)
Called to Discuss with patient about Covid symptoms and the use of the monoclonal antibody infusion for those with mild to moderate Covid symptoms and at a high risk of hospitalization.     Pt appears to qualify for this infusion due to co-morbid conditions and/or a member of an at-risk group in accordance with the FDA Emergency Use Authorization.   Michele Rich was seen in the [facility] ED on 9/22 with cough, generalized body aches, generalized weakness and fatigue. Symptoms started on 9/20. Qualifying risk factors include high SVI and alcoholism.  I attempted to speak with Michele Rich and was unable to reach her via telephone. A generic voicemail was left in addition to MyChart message and text.   Marcos Eke, NP 05/08/2020 4:02 PM

## 2020-05-08 NOTE — ED Notes (Signed)
Pt ambulated in room with sats staying at 100% but pt endorsed some shortness of breath. Pt also was tachycardic while ambulating in the 110-118 range.

## 2020-05-09 ENCOUNTER — Telehealth: Payer: Self-pay | Admitting: Primary Care

## 2020-05-09 NOTE — Telephone Encounter (Signed)
Called to schedule appointment for patient per referral for Transition of Care Clinic / Post Covid Care Clinic. Unable to reach patient. Left voicemail for patient to return call.  

## 2020-05-26 ENCOUNTER — Ambulatory Visit (INDEPENDENT_AMBULATORY_CARE_PROVIDER_SITE_OTHER): Payer: Medicaid Other | Admitting: Primary Care

## 2020-07-17 ENCOUNTER — Emergency Department (HOSPITAL_COMMUNITY): Payer: Medicaid Other

## 2020-07-17 ENCOUNTER — Encounter (HOSPITAL_COMMUNITY): Payer: Self-pay

## 2020-07-17 ENCOUNTER — Emergency Department (HOSPITAL_COMMUNITY)
Admission: EM | Admit: 2020-07-17 | Discharge: 2020-07-17 | Disposition: A | Discharge status: Home / Self Care | Payer: Medicaid Other | Attending: Emergency Medicine | Admitting: Emergency Medicine

## 2020-07-17 DIAGNOSIS — H538 Other visual disturbances: Secondary | ICD-10-CM

## 2020-07-17 DIAGNOSIS — R0602 Shortness of breath: Secondary | ICD-10-CM | POA: Diagnosis not present

## 2020-07-17 DIAGNOSIS — R0789 Other chest pain: Secondary | ICD-10-CM | POA: Diagnosis not present

## 2020-07-17 DIAGNOSIS — R0781 Pleurodynia: Secondary | ICD-10-CM | POA: Insufficient documentation

## 2020-07-17 DIAGNOSIS — M7918 Myalgia, other site: Secondary | ICD-10-CM

## 2020-07-17 DIAGNOSIS — F17210 Nicotine dependence, cigarettes, uncomplicated: Secondary | ICD-10-CM | POA: Diagnosis not present

## 2020-07-17 DIAGNOSIS — J3489 Other specified disorders of nose and nasal sinuses: Secondary | ICD-10-CM | POA: Diagnosis not present

## 2020-07-17 DIAGNOSIS — Z8616 Personal history of COVID-19: Secondary | ICD-10-CM | POA: Diagnosis not present

## 2020-07-17 DIAGNOSIS — R0981 Nasal congestion: Secondary | ICD-10-CM | POA: Insufficient documentation

## 2020-07-17 DIAGNOSIS — R109 Unspecified abdominal pain: Secondary | ICD-10-CM | POA: Insufficient documentation

## 2020-07-17 DIAGNOSIS — R059 Cough, unspecified: Secondary | ICD-10-CM | POA: Diagnosis not present

## 2020-07-17 DIAGNOSIS — R52 Pain, unspecified: Secondary | ICD-10-CM

## 2020-07-17 DIAGNOSIS — M791 Myalgia, unspecified site: Secondary | ICD-10-CM | POA: Diagnosis not present

## 2020-07-17 DIAGNOSIS — Z20822 Contact with and (suspected) exposure to covid-19: Secondary | ICD-10-CM | POA: Insufficient documentation

## 2020-07-17 LAB — BASIC METABOLIC PANEL
Anion gap: 18 — ABNORMAL HIGH (ref 5–15)
BUN: 9 mg/dL (ref 6–20)
CO2: 22 mmol/L (ref 22–32)
Calcium: 9.2 mg/dL (ref 8.9–10.3)
Chloride: 102 mmol/L (ref 98–111)
Creatinine, Ser: 0.68 mg/dL (ref 0.44–1.00)
GFR, Estimated: >60 mL/min (ref >60)
Glucose, Bld: 90 mg/dL (ref 70–99)
Potassium: 3.5 mmol/L (ref 3.5–5.1)
Sodium: 142 mmol/L (ref 135–145)

## 2020-07-17 LAB — CBC
HCT: 37.8 % (ref 36.0–46.0)
Hemoglobin: 12.5 g/dL (ref 12.0–15.0)
MCH: 33.1 pg (ref 26.0–34.0)
MCHC: 33.1 g/dL (ref 30.0–36.0)
MCV: 100 fL (ref 80.0–100.0)
Platelets: 189 10*3/uL (ref 150–400)
RBC: 3.78 MIL/uL — ABNORMAL LOW (ref 3.87–5.11)
RDW: 16.8 % — ABNORMAL HIGH (ref 11.5–15.5)
WBC: 4.7 10*3/uL (ref 4.0–10.5)
nRBC: 0 % (ref 0.0–0.2)

## 2020-07-17 LAB — URINALYSIS, ROUTINE W REFLEX MICROSCOPIC
Bacteria, UA: NONE SEEN
Bilirubin Urine: NEGATIVE
Glucose, UA: NEGATIVE mg/dL
Ketones, ur: 80 mg/dL — AB
Leukocytes,Ua: NEGATIVE
Nitrite: NEGATIVE
Protein, ur: 30 mg/dL — AB
Specific Gravity, Urine: 1.024 (ref 1.005–1.030)
pH: 6 (ref 5.0–8.0)

## 2020-07-17 LAB — I-STAT BETA HCG BLOOD, ED (MC, WL, AP ONLY): I-stat hCG, quantitative: <5 m[IU]/mL (ref <5)

## 2020-07-17 LAB — RESP PANEL BY RT-PCR (FLU A&B, COVID) ARPGX2
Influenza A by PCR: NEGATIVE
Influenza B by PCR: NEGATIVE
SARS Coronavirus 2 by RT PCR: NEGATIVE

## 2020-07-17 MED ORDER — ONDANSETRON HCL 4 MG/2ML IJ SOLN
4.0000 mg | Freq: Once | INTRAMUSCULAR | Status: AC
  Administered 2020-07-17: 4 mg via INTRAVENOUS
  Filled 2020-07-17: qty 2

## 2020-07-17 MED ORDER — FLUORESCEIN SODIUM 1 MG OP STRP
1.0000 | ORAL_STRIP | Freq: Once | OPHTHALMIC | Status: AC
  Administered 2020-07-17: 1 via OPHTHALMIC
  Filled 2020-07-17: qty 1

## 2020-07-17 MED ORDER — SODIUM CHLORIDE 0.9 % IV BOLUS
1000.0000 mL | Freq: Once | INTRAVENOUS | Status: AC
  Administered 2020-07-17: 1000 mL via INTRAVENOUS

## 2020-07-17 MED ORDER — IOHEXOL 350 MG/ML SOLN
100.0000 mL | Freq: Once | INTRAVENOUS | Status: AC | PRN
  Administered 2020-07-17: 100 mL via INTRAVENOUS

## 2020-07-17 MED ORDER — MORPHINE SULFATE (PF) 2 MG/ML IV SOLN: 2.0000 mg | Freq: Once | INTRAVENOUS | Status: DC

## 2020-07-17 MED ORDER — GADOBUTROL 1 MMOL/ML IV SOLN
7.0000 mL | Freq: Once | INTRAVENOUS | Status: AC | PRN
  Administered 2020-07-17: 7 mL via INTRAVENOUS

## 2020-07-17 MED ORDER — TETRACAINE HCL 0.5 % OP SOLN
1.0000 [drp] | Freq: Once | OPHTHALMIC | Status: AC
  Administered 2020-07-17: 1 [drp] via OPHTHALMIC
  Filled 2020-07-17: qty 4

## 2020-07-17 MED ORDER — ONDANSETRON HCL 4 MG/2ML IJ SOLN: 4.0000 mg | Freq: Once | INTRAMUSCULAR | Status: DC

## 2020-07-17 MED ORDER — KETOROLAC TROMETHAMINE 30 MG/ML IJ SOLN
30.0000 mg | Freq: Once | INTRAMUSCULAR | Status: AC
  Administered 2020-07-17: 30 mg via INTRAVENOUS
  Filled 2020-07-17: qty 1

## 2020-07-17 NOTE — ED Notes (Signed)
Pt back from ct

## 2020-07-17 NOTE — ED Triage Notes (Signed)
Pt states that she has had generalized body aches over the past two days, SOB, denies fevers. Pt also reports burry vision that started yesterday, neuro intact bilaterally. Pt is partially vaccinated

## 2020-07-17 NOTE — ED Provider Notes (Signed)
[facility] EMERGENCY DEPARTMENT Provider Note   CSN: [id number] Arrival date & time: 07/17/20  0440     History Chief Complaint  Patient presents with  . Generalized Body Aches    Michele Rich is a 34 y.o. 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  Patient reports he had Covid in September 2021.  She has had 1 vaccine prior to her Covid infection disease she has not gotten the second vaccine.  She reports that over the last 2 to 3 days, she has had body aches.  She states she mostly lives in her thighs and in her sides.  She states that she feels achiness on her sides.  She states it is worse when she moves.  She also notes it slightly worse with deep inspiration.  She states that since her COVID-19 diagnosis, she has had some cough, difficulty with breathing.  She states she has been told that this is on her effects of Covid.  She feels like the last few days, they have gotten worse.  She states that she feels like she has some congestion in her chest and says she has been coughing up some phlegm.  No hemoptysis.  She states she has not noted any fevers.  She has not had any abdominal pain, dysuria, hematuria.  She has a couple occasional episodes of vomiting yesterday.  No blood in emesis.  She also reports 2 days ago, she was driving and had some episodes of blurry vision.  He states it was in both eyes but then seem to be worse in the left eye.  She reports that about 2 days ago, she was at home and felt like she had vision loss in her left eye.  She states this lasted for about 5 minutes before resolving.  He states since then, she has not had any more vision loss but has still had some blurry vision.  She denies any numbness/weakness of arms or legs.  She smokes about 1 cigarette a week.  She does report that she uses alcohol daily.  Last drink was  yesterday and was a small $4 bottle of vodka.  The history is provided by the patient.       Past Medical History:  Diagnosis Date  . Alcoholism (HCC)   . HPV in female     Patient Active Problem List   Diagnosis Date Noted  . Alcohol withdrawal syndrome without complication (HCC) 02/20/2020  . Intractable nausea and vomiting 02/20/2020  . Epigastric abdominal tenderness without rebound tenderness 02/20/2020  . Pancreatitis 02/20/2020  . MDD (major depressive disorder) 01/04/2019  . Alcohol dependence with alcohol-induced mood disorder (HCC) 01/04/2019    Past Surgical History:  Procedure Laterality Date  . NO PAST SURGERIES       OB History    Gravida  3   Para  2   Term  2   Preterm      AB  1   Living  2     SAB      TAB      Ectopic      Multiple      Live Births              Family History  Family history unknown: Yes    Social History   Tobacco Use  . Smoking status: Current Every Day Smoker    Packs/day:  0.10    Types: Cigarettes  . Smokeless tobacco: Never Used  Vaping Use  . Vaping Use: Never used  Substance Use Topics  . Alcohol use: Yes    Comment: daily  . Drug use: No    Home Medications Prior to Admission medications   Medication Sig Start Date End Date Taking? Authorizing Provider  FLUoxetine (PROZAC) 20 MG capsule Take 20 mg by mouth daily.    [provider]  folic acid (FOLVITE) 1 MG tablet Take 1 tablet (1 mg total) by mouth daily. 02/24/20   Danford, Earl Liteshristopher P, MD  gabapentin (NEURONTIN) 100 MG capsule Take 1 capsule (100 mg total) by mouth 3 (three) times daily. Patient not taking: Reported on 04/28/2020 02/23/20   Alberteen Samanford, Christopher P, MD  metroNIDAZOLE (FLAGYL) 500 MG tablet Take 1 tablet (500 mg total) by mouth 2 (two) times daily. 04/29/20   Grayce SessionsEdwards, Michelle P, NP  ondansetron (ZOFRAN ODT) 4 MG disintegrating tablet Take 1 tablet (4 mg total) by mouth every 8 (eight) hours as needed for nausea.  05/08/20   Garlon HatchetSanders, Lisa M, PA-C  OXcarbazepine (TRILEPTAL) 150 MG tablet Take 150 mg by mouth 2 (two) times daily.    [provider]  potassium chloride (KLOR-CON) 10 MEQ tablet Take 1 tablet (10 mEq total) by mouth at bedtime. Patient not taking: Reported on 04/28/2020 02/23/20   Alberteen Samanford, Christopher P, MD  thiamine 100 MG tablet Take 1 tablet (100 mg total) by mouth daily. 02/24/20   Danford, Earl Liteshristopher P, MD    Allergies    Apple fruit extract  Review of Systems   Review of Systems  Constitutional: Negative for fever.  Eyes: Positive for visual disturbance.  Respiratory: Positive for cough and shortness of breath.   Cardiovascular: Negative for chest pain.  Gastrointestinal: Negative for abdominal pain, nausea and vomiting.  Genitourinary: Negative for dysuria and hematuria.  Musculoskeletal: Positive for myalgias.  Neurological: Negative for headaches.    Physical Exam Updated Vital Signs BP (!) 139/92 (BP Location: Left Arm)   Pulse 74   Temp 98.3 F (36.8 C) (Oral)   Resp 15   SpO2 100%   Physical Exam Vitals and nursing note reviewed.  Constitutional:      Appearance: Normal appearance. She is well-developed.  HENT:     Head: Normocephalic and atraumatic.  Eyes:     General: Lids are normal.     Conjunctiva/sclera: Conjunctivae normal.     Pupils: Pupils are equal, round, and reactive to light.     Comments: PERRL. EOMs intact. No nystagmus. No neglect. Visual fields intact though she does report some blurry vision in the left eye.    Cardiovascular:     Rate and Rhythm: Normal rate and regular rhythm.     Pulses: Normal pulses.     Heart sounds: Normal heart sounds. No murmur heard.  No friction rub. No gallop.   Pulmonary:     Effort: Pulmonary effort is normal.     Breath sounds: Normal breath sounds.     Comments: Lungs clear to auscultation bilaterally.  Symmetric chest rise.  No wheezing, rales, rhonchi. Abdominal:     Palpations: Abdomen is  soft. Abdomen is not rigid.     Tenderness: There is no abdominal tenderness. There is no guarding.       Comments: Abdomen is soft, non-distended, non-tender. No rigidity, No guarding. No peritoneal signs. No CVA tenderness bilaterally. Tenderness noted to the the sides of her torso bilaterally.  Musculoskeletal:        General: Normal range of motion.     Cervical back: Full passive range of motion without pain.  Skin:    General: Skin is warm and dry.     Capillary Refill: Capillary refill takes less than 2 seconds.  Neurological:     Mental Status: She is alert and oriented to person, place, and time.     Comments: Cranial nerves III-XII intact Follows commands, Moves all extremities  5/5 strength to BUE and BLE  Sensation intact throughout all major nerve distributions  No slurred speech. No facial droop.   Psychiatric:        Speech: Speech normal.     ED Results / Procedures / Treatments   Labs (all labs ordered are listed, but only abnormal results are displayed) Labs Reviewed  CBC - Abnormal; Notable for the following components:      Result Value   RBC 3.78 (*)    RDW 16.8 (*)    All other components within normal limits  BASIC METABOLIC PANEL - Abnormal; Notable for the following components:   Anion gap 18 (*)    All other components within normal limits  URINALYSIS, ROUTINE W REFLEX MICROSCOPIC - Abnormal; Notable for the following components:   APPearance HAZY (*)    Hgb urine dipstick SMALL (*)    Ketones, ur 80 (*)    Protein, ur 30 (*)    All other components within normal limits  RESP PANEL BY RT-PCR (FLU A&B, COVID) ARPGX2  I-STAT BETA HCG BLOOD, ED ([facility], [facility], [facility] ONLY)    EKG None  Radiology CT Angio Chest PE W and/or Wo Contrast  Result Date: 07/17/2020 CLINICAL DATA:  34 year old female with possible pulmonary emboli EXAM: CT ANGIOGRAPHY CHEST CT ABDOMEN AND PELVIS WITH CONTRAST TECHNIQUE: Multidetector CT imaging of the chest was performed using  the standard protocol during bolus administration of intravenous contrast. Multiplanar CT image reconstructions and MIPs were obtained to evaluate the vascular anatomy. Multidetector CT imaging of the abdomen and pelvis was performed using the standard protocol during bolus administration of intravenous contrast. CONTRAST:  OMNIPAQUE IOHEXOL 350 MG/ML SOLN COMPARISON:  CT chest 06/21/2019 FINDINGS: CTA CHEST FINDINGS Cardiovascular: Heart: No cardiomegaly. No pericardial fluid/thickening. No significant coronary calcifications. Aorta: Unremarkable course, caliber, contour of the thoracic aorta. No aneurysm or dissection flap. No periaortic fluid. Pulmonary arteries: No central, lobar, segmental, or proximal subsegmental filling defects. Mediastinum/Nodes: No mediastinal adenopathy. The lymph node in the right paratracheal nodal station has decreased in size from the comparison. Unremarkable appearance of the thoracic esophagus. Unremarkable appearance of the thoracic inlet and thyroid. Lungs/Pleura: Central airways are clear. No pleural effusion. No confluent airspace disease. No pneumothorax. CT ABDOMEN and PELVIS FINDINGS Hepatobiliary: Diffusely decreased liver attenuation. Unremarkable gallbladder Pancreas: Unremarkable Spleen: Unremarkable Adrenals/Urinary Tract: Adrenal glands unremarkable. Bilateral kidneys unremarkable. Urinary bladder is relatively decompressed. Stomach/Bowel: Unremarkable stomach. Unremarkable small bowel. Normal appendix. Mild stool burden. Mild diverticular change without evidence of acute inflammation. No obstruction. Vascular/Lymphatic: No significant atherosclerotic changes. Mesenteric and renal arteries patent. Bilateral iliac arteries and proximal femoral arteries patent. No lymphadenopathy. Reproductive: Unremarkable uterus/adnexa, with likely physiologic changes. Other: None Musculoskeletal: No acute displaced fracture. No significant bony canal narrowing. Redemonstration  excrescence from the left iliac crest with sessile configuration, rings and arcs, with continuity with the medullary canal (osteochondroma). No change from the prior. Review of the MIP images confirms the above findings. IMPRESSION: Chest CT: Negative for pulmonary emboli. No  acute finding to account for dyspnea. Abdominal/pelvic CT: No acute finding to account for abdominal pain. Steatosis. Additional ancillary findings as above. Electronically Signed   By: Gilmer Mor D.O.   On: 07/17/2020 12:18   MR Brain W and Wo Contrast  Result Date: 07/17/2020 CLINICAL DATA:  Monocular vision loss. EXAM: MRI HEAD WITHOUT AND WITH CONTRAST TECHNIQUE: Multiplanar, multiecho pulse sequences of the brain and surrounding structures were obtained without and with intravenous contrast. CONTRAST:  59mL GADAVIST GADOBUTROL 1 MMOL/ML IV SOLN COMPARISON:  None. FINDINGS: Brain: No acute infarction, hemorrhage, hydrocephalus, extra-axial collection or mass lesion. Mild cerebral atrophy for age. Normal white matter. Vascular: Normal arterial flow voids. Skull and upper cervical spine: No focal skeletal lesion. Sinuses/Orbits: Mucosal edema left maxillary sinus.  Normal orbit Other: None IMPRESSION: Mild atrophy. No acute intracranial abnormality. No significant white matter change. Electronically Signed   By: Marlan Palau M.D.   On: 07/17/2020 13:42   CT ABDOMEN PELVIS W CONTRAST  Result Date: 07/17/2020 CLINICAL DATA:  34 year old female with possible pulmonary emboli EXAM: CT ANGIOGRAPHY CHEST CT ABDOMEN AND PELVIS WITH CONTRAST TECHNIQUE: Multidetector CT imaging of the chest was performed using the standard protocol during bolus administration of intravenous contrast. Multiplanar CT image reconstructions and MIPs were obtained to evaluate the vascular anatomy. Multidetector CT imaging of the abdomen and pelvis was performed using the standard protocol during bolus administration of intravenous contrast. CONTRAST:   OMNIPAQUE IOHEXOL 350 MG/ML SOLN COMPARISON:  CT chest 06/21/2019 FINDINGS: CTA CHEST FINDINGS Cardiovascular: Heart: No cardiomegaly. No pericardial fluid/thickening. No significant coronary calcifications. Aorta: Unremarkable course, caliber, contour of the thoracic aorta. No aneurysm or dissection flap. No periaortic fluid. Pulmonary arteries: No central, lobar, segmental, or proximal subsegmental filling defects. Mediastinum/Nodes: No mediastinal adenopathy. The lymph node in the right paratracheal nodal station has decreased in size from the comparison. Unremarkable appearance of the thoracic esophagus. Unremarkable appearance of the thoracic inlet and thyroid. Lungs/Pleura: Central airways are clear. No pleural effusion. No confluent airspace disease. No pneumothorax. CT ABDOMEN and PELVIS FINDINGS Hepatobiliary: Diffusely decreased liver attenuation. Unremarkable gallbladder Pancreas: Unremarkable Spleen: Unremarkable Adrenals/Urinary Tract: Adrenal glands unremarkable. Bilateral kidneys unremarkable. Urinary bladder is relatively decompressed. Stomach/Bowel: Unremarkable stomach. Unremarkable small bowel. Normal appendix. Mild stool burden. Mild diverticular change without evidence of acute inflammation. No obstruction. Vascular/Lymphatic: No significant atherosclerotic changes. Mesenteric and renal arteries patent. Bilateral iliac arteries and proximal femoral arteries patent. No lymphadenopathy. Reproductive: Unremarkable uterus/adnexa, with likely physiologic changes. Other: None Musculoskeletal: No acute displaced fracture. No significant bony canal narrowing. Redemonstration excrescence from the left iliac crest with sessile configuration, rings and arcs, with continuity with the medullary canal (osteochondroma). No change from the prior. Review of the MIP images confirms the above findings. IMPRESSION: Chest CT: Negative for pulmonary emboli. No acute finding to account for dyspnea. Abdominal/pelvic  CT: No acute finding to account for abdominal pain. Steatosis. Additional ancillary findings as above. Electronically Signed   By: Gilmer Mor D.O.   On: 07/17/2020 12:18   DG Chest Portable 1 View  Result Date: 07/17/2020 CLINICAL DATA:  Shortness of breath EXAM: PORTABLE CHEST 1 VIEW COMPARISON:  05/07/2020 FINDINGS: Normal heart size and mediastinal contours. Artifact from EKG leads. No acute infiltrate or edema. No effusion or pneumothorax. No acute osseous findings. IMPRESSION: No active disease. Electronically Signed   By: Marnee Spring M.D.   On: 07/17/2020 05:18    Procedures Procedures (including critical care time)  Medications Ordered in ED Medications  ondansetron (ZOFRAN) injection 4 mg (4 mg Intravenous Given 07/17/20 1008)  sodium chloride 0.9 % bolus 1,000 mL (0 mLs Intravenous Stopped 07/17/20 1203)  ketorolac (TORADOL) 30 MG/ML injection 30 mg (30 mg Intravenous Given 07/17/20 1134)  iohexol (OMNIPAQUE) 350 MG/ML injection 100 mL (100 mLs Intravenous Contrast Given 07/17/20 1158)  gadobutrol (GADAVIST) 1 MMOL/ML injection 7 mL (7 mLs Intravenous Contrast Given 07/17/20 1336)  fluorescein ophthalmic strip 1 strip (1 strip Both Eyes Given 07/17/20 1510)  tetracaine (PONTOCAINE) 0.5 % ophthalmic solution 1 drop (1 drop Both Eyes Given 07/17/20 1511)    ED Course  I have reviewed the triage vital signs and the nursing notes.  Pertinent labs & imaging results that were available during my care of the patient were reviewed by me and considered in my medical decision making (see chart for details).    MDM Rules/Calculators/A&P                          34 year old female who presents for evaluation of multiple issues.  She reports that she had recently had Covid a couple months ago.  Since then, she has had issues with shortness of breath and coughing.  She states that she feels like the shortness of breath has been worse over the last couple days.  She also reports she has had  body aches, nasal congestion, rhinorrhea.  She also reports pain to her bilateral sides.  She states that this area is worse with deep inspiration, when she moves.  She also reports she has had some blurry vision that occurred a couple days ago.  She also reports an episode where she lost vision in her left eye for about 5 minutes.  That since resolved.  She states she can still see out of her left eye but does report some blurry vision.  She got Covid in September.  She had one vaccine prior to Covid and has not gotten 2nd.  No Covid exposure that she knows of.  On exam, she has some tenderness palpation noted bilateral torso just right under the rib cage.  This is worse with movement but also worse with deep inspiration.  No obvious rales noted on exam.  Abdomen otherwise benign.  Neuro exam is unremarkable.  I discussed with Dr. Jerrell Belfast (neuro) who recommends obtaining an MRI with and without contrast.  I-STAT beta negative.  UA shows small hemoglobin.  Otherwise no infectious etiology.  BMP shows anion gap of 18.  Likely from alcohol use.  Patient given fluids here in the ED.  Her BUN and creatinine are within normal limits.  CBC shows no leukocytosis.  Hemoglobin is stable.  Covid is negative.  She is post Covid and is having worsening shortness of breath.  She also reports some pleuritic pain to her torso area.  Given her history, will obtain PE study for evaluation of possible PEs.    MRI brain shows mild atrophy.  No acute intracranial abnormality.  CT chest negative for any PE.  CT and pelvis negative for any acute abnormality.  Visual acuity documented. This was done after tetracaine back that this is influencing her symptoms.  Evaluation with Wood's Lamp shows no evidence of fluorescein uptake or evidence of corneal abrasion.   At this time, her work-up is unremarkable.  We will plan to give her outpatient ophthalmology to follow-up.  I suspect her pain at this time is musculoskeletal in  etiology.  We will plan to  treat. At this time, patient exhibits no emergent life-threatening condition that require further evaluation in ED. Discussed patient with Dr. Adela Lank who is agreeable to plan. Patient had ample opportunity for questions and discussion. All patient's questions were answered with full understanding. Strict return precautions discussed. Patient expresses understanding and agreement to plan.   Portions of this note were generated with Scientist, clinical (histocompatibility and immunogenetics). Dictation errors may occur despite best attempts at proofreading.  Final Clinical Impression(s) / ED Diagnoses Final diagnoses:  Blurry vision  Generalized body aches  Musculoskeletal pain    Rx / DC Orders ED Discharge Orders    None       Maxwell Caul, PA-C 07/17/20 1517    Melene Plan, DO 07/17/20 1520

## 2020-07-17 NOTE — ED Notes (Signed)
Patient transported to CT 

## 2020-07-17 NOTE — Discharge Instructions (Addendum)
As we discussed, your work-up today was reassuring.  Your MRI did not show any evidence of brain or eye abnormalities that would contribute to your blurry vision.  We provided you outpatient follow-up with an ophthalmologist.  Additionally, your pain may be related to muscle pain.  We will plan to treat this.  Take Robaxin as prescribed. This medication will make you drowsy so do not drive or drink alcohol when taking it.  Follow-up with your primary care doctor.  Return the emergency department for any chest pain, difficulty breathing, vomiting or any other worsening concerning symptoms.

## 2020-07-17 NOTE — ED Notes (Signed)
Patient transported to MRI via stretcher in stable condition 

## 2020-07-17 NOTE — ED Notes (Signed)
Patient verbalized understanding of discharge instructions. Opportunity for questions and answers.  

## 2020-07-17 NOTE — ED Notes (Signed)
Patient transported to MRI 

## 2020-07-31 ENCOUNTER — Encounter (HOSPITAL_COMMUNITY): Payer: Self-pay

## 2020-07-31 ENCOUNTER — Ambulatory Visit (HOSPITAL_COMMUNITY)
Admission: EM | Admit: 2020-07-31 | Discharge: 2020-08-01 | Disposition: A | Discharge status: Home / Self Care | Payer: Medicaid Other | Attending: Student | Admitting: Student

## 2020-07-31 ENCOUNTER — Other Ambulatory Visit: Payer: Self-pay

## 2020-07-31 DIAGNOSIS — R45851 Suicidal ideations: Secondary | ICD-10-CM | POA: Diagnosis not present

## 2020-07-31 DIAGNOSIS — R45850 Homicidal ideations: Secondary | ICD-10-CM

## 2020-07-31 DIAGNOSIS — F32A Depression, unspecified: Secondary | ICD-10-CM | POA: Insufficient documentation

## 2020-07-31 DIAGNOSIS — F4310 Post-traumatic stress disorder, unspecified: Secondary | ICD-10-CM | POA: Diagnosis not present

## 2020-07-31 DIAGNOSIS — Z8616 Personal history of COVID-19: Secondary | ICD-10-CM | POA: Diagnosis not present

## 2020-07-31 DIAGNOSIS — F1020 Alcohol dependence, uncomplicated: Secondary | ICD-10-CM

## 2020-07-31 DIAGNOSIS — Z9114 Patient's other noncompliance with medication regimen: Secondary | ICD-10-CM | POA: Insufficient documentation

## 2020-07-31 HISTORY — DX: Depression, unspecified: F32.A

## 2020-07-31 HISTORY — DX: Post-traumatic stress disorder, unspecified: F43.10

## 2020-07-31 LAB — POCT URINE DRUG SCREEN - MANUAL ENTRY (I-SCREEN)
POC Amphetamine UR: NOT DETECTED
POC Buprenorphine (BUP): NOT DETECTED
POC Cocaine UR: NOT DETECTED
POC Marijuana UR: NOT DETECTED
POC Methadone UR: NOT DETECTED
POC Methamphetamine UR: NOT DETECTED
POC Morphine: NOT DETECTED
POC Oxazepam (BZO): NOT DETECTED
POC Oxycodone UR: NOT DETECTED
POC Secobarbital (BAR): NOT DETECTED

## 2020-07-31 LAB — POC SARS CORONAVIRUS 2 AG: SARS Coronavirus 2 Ag: NEGATIVE

## 2020-07-31 LAB — POCT PREGNANCY, URINE: Preg Test, Ur: NEGATIVE

## 2020-07-31 LAB — POC SARS CORONAVIRUS 2 AG -  ED: SARS Coronavirus 2 Ag: NEGATIVE

## 2020-07-31 MED ORDER — LORAZEPAM 1 MG PO TABS
1.0000 mg | ORAL_TABLET | Freq: Four times a day (QID) | ORAL | Status: DC | PRN
  Administered 2020-07-31: 23:00:00 1 mg via ORAL
  Filled 2020-07-31: qty 1

## 2020-07-31 MED ORDER — HYDROXYZINE HCL 25 MG PO TABS: 25.0000 mg | ORAL_TABLET | Freq: Three times a day (TID) | ORAL | Status: DC | PRN

## 2020-07-31 MED ORDER — ACETAMINOPHEN 325 MG PO TABS: 650.0000 mg | ORAL_TABLET | Freq: Four times a day (QID) | ORAL | Status: DC | PRN

## 2020-07-31 MED ORDER — ONDANSETRON 4 MG PO TBDP: 4.0000 mg | ORAL_TABLET | Freq: Four times a day (QID) | ORAL | Status: DC | PRN

## 2020-07-31 MED ORDER — LOPERAMIDE HCL 2 MG PO CAPS: 2.0000 mg | ORAL_CAPSULE | ORAL | Status: DC | PRN

## 2020-07-31 MED ORDER — ALUM & MAG HYDROXIDE-SIMETH 200-200-20 MG/5ML PO SUSP: 30.0000 mL | ORAL | Status: DC | PRN

## 2020-07-31 MED ORDER — MAGNESIUM HYDROXIDE 400 MG/5ML PO SUSP: 30.0000 mL | Freq: Every day | ORAL | Status: DC | PRN

## 2020-07-31 MED ORDER — THIAMINE HCL 100 MG PO TABS
100.0000 mg | ORAL_TABLET | Freq: Every day | ORAL | Status: DC
  Administered 2020-08-01: 10:00:00 100 mg via ORAL
  Filled 2020-07-31: qty 1

## 2020-07-31 MED ORDER — TRAZODONE HCL 50 MG PO TABS
50.0000 mg | ORAL_TABLET | Freq: Every evening | ORAL | Status: DC | PRN
  Administered 2020-07-31: 23:00:00 50 mg via ORAL
  Filled 2020-07-31: qty 1
  Filled 2020-07-31: qty 7

## 2020-07-31 MED ORDER — ADULT MULTIVITAMIN W/MINERALS CH
1.0000 | ORAL_TABLET | Freq: Every day | ORAL | Status: DC
  Administered 2020-07-31 – 2020-08-01 (×2): 1 via ORAL
  Filled 2020-07-31 (×2): qty 1

## 2020-07-31 NOTE — ED Notes (Signed)
[address] 30 
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
## 2020-07-31 NOTE — Progress Notes (Signed)
Received Michele [facility] this PM at the [facility] with [employer], she is alert and oriented, but tearful. She was assisted with the admission questions. Her chief compliant is suicidal with the intent to stab herself in the neck. She cannot pinpoint an event that led to her admission tonight. She has a history of PTSD and depression. She has been noncompliant with her medications.She was covid positive 2 months ago and continues to have muscle aches and difficulty breathing.

## 2020-07-31 NOTE — BH Assessment (Signed)
Comprehensive Clinical Assessment (CCA) Screening, Triage and Referral Note  07/31/2020 Michele Rich [id number]  Michele Rich is an 34 y.o. female. Pt presents to [facility] voluntarily brought in by [employer] for suicidal thoughts with a plan to cut herself with a knife. Pt states at first there was no trigger to her SI thoughts, then she states she does stay with her mother and that they do not get along and her mother is a trigger. Pt admits to current SI, denies HI and SIB but admits to AVH states she hears voices last few seeks and has been seeing bugs. During assessment presented to be intoxicated although she denies alcohol use today, states she did drink liquor yesterday denies any other drug abuse. Pt states she has not took medications in over 2 months and states her provider is [facility] but she has not followed up with them. Pt states she stopped taking medications because she did not like the side effects of this medications. Pt reports poor appetite and poor sleep and states she has been depressed also because she has not been breathing right due to having COVID 2 months ago. Pt states she is open to treatment.   Disposition: Melbourne Abts, PA-C recommends pt for overnight observation, reassess in the morning.   Diagnosis: F10.24 Alcohol-induced depressive disorder, with severe use          PTSD   Chief Complaint:  Chief Complaint  Patient presents with  . Suicidal  . Depression    PTSD   Visit Diagnosis: PTSD, depression, SI thoughts with a plan  Patient Reported Information How did you hear about [place]? Self (Phreesia 07/31/2020)   Referral name: [facility] 07/31/2020)   Referral phone number: No data recorded Whom do you see for routine medical problems? Other (Comment) (Phreesia 07/31/2020)   [facility] (Phreesia 07/31/2020)   Practice/Facility Phone Number: No data recorded  Name of Contact: No data  recorded  Contact Number: No data recorded  Contact Fax Number: No data recorded  Prescriber Name: No data recorded  Prescriber Address (if known): No data recorded What Is the Reason for Your Visit/Call Today? Suicidal (Phreesia 07/31/2020)  How Long Has This Been Causing You Problems? > than 6 months (Phreesia 07/31/2020)  Have You Recently Been in Any Inpatient Treatment (Rich/Detox/Crisis Center/28-Day Program)? Yes (Phreesia 07/31/2020)   Name/Location of Program/[facility] (Phreesia 07/31/2020)   How Long Were You There? Over A Wek (Phreesia 07/31/2020)   When Were You Discharged? No data recorded Have You Ever Received Services From [facility] Before? Yes (Phreesia 07/31/2020)   Who Do You See at [facility]? Do Not Know Name Michele [facility] 07/31/2020)  Have You Recently Had Any Thoughts About Hurting Yourself? Yes (Phreesia 07/31/2020)   Are You Planning to Commit Suicide/Harm Yourself At This time?  Yes (Phreesia 07/31/2020)  Have you Recently Had Thoughts About Hurting Someone Michele Rich? Yes (Phreesia 07/31/2020)   Explanation: Everybody (Phreesia 07/31/2020)  Have You Used Any Alcohol or Drugs in the Past 24 Hours? Yes (Phreesia 07/31/2020)   How Long Ago Did You Use Drugs or Alcohol?  Yesterday/alcohol  What Did You Use and How Much? one Half Bottle (Phreesia 07/31/2020)  What Do You Feel Would Help You the Most Today? Other (Comment) (Phreesia 07/31/2020)  Do You Currently Have a Therapist/Psychiatrist? Yes (Phreesia 07/31/2020)   Name of Therapist/Psychiatrist: Marcelino [facility]  (Phreesia 07/31/2020)   Have You Been Recently Discharged From Any Office Practice or Programs? No (  Phreesia 07/31/2020)   Explanation of Discharge From Practice/Program:  No data recorded    CCA Screening Triage Referral Assessment Type of Contact: Tele-Assessment   Is this Initial or Reassessment? Initial Assessment   Date Telepsych consult ordered in [facility]:  07/31/20  Time  Telepsych consult ordered in [facility]:  No data recorded Patient Reported Information Reviewed? Yes   Patient Left Without Being Seen? No data recorded  Reason for Not Completing Assessment: -- (patient intoxicated)  Collateral Involvement: none Does Patient Have a Automotive engineer Guardian? No data recorded  Name and Contact of Legal Guardian:  No data recorded If Minor and Not Living with Parent(s), Who has Custody? No data recorded Is CPS involved or ever been involved? Never  Is APS involved or ever been involved? Never  Patient Determined To Be At Risk for Harm To Self or Others Based on Review of Patient Reported Information or Presenting Complaint? Yes, for Self-Harm   Method: No data recorded  Availability of Means: No data recorded  Intent: No data recorded  Notification Required: No data recorded  Additional Information for Danger to Others Potential:  No data recorded  Additional Comments for Danger to Others Potential:  No data recorded  Are There Guns or Other Weapons in Your Home?  No data recorded   Types of Guns/Weapons: No data recorded   Are These Weapons Safely Secured?                              No data recorded   Who Could Verify You Are Able To Have These Secured:    No data recorded Do You Have any Outstanding Charges, Pending Court Dates, Parole/Probation? No data recorded Contacted To Inform of Risk of Harm To Self or Others: No data recorded Location of Assessment: [facility] ED  Does Patient Present under Involuntary Commitment? NO  IVC Papers Initial File Date:   [place] of Residence: [place]  Patient Currently Receiving the Following Services: Medication Management   Determination of Need: No data recorded  Options For Referral: No data recorded  Natasha Mead, LCSWA

## 2020-08-01 DIAGNOSIS — R45851 Suicidal ideations: Secondary | ICD-10-CM | POA: Diagnosis not present

## 2020-08-01 DIAGNOSIS — Z8616 Personal history of COVID-19: Secondary | ICD-10-CM | POA: Diagnosis not present

## 2020-08-01 DIAGNOSIS — F4310 Post-traumatic stress disorder, unspecified: Secondary | ICD-10-CM | POA: Diagnosis not present

## 2020-08-01 DIAGNOSIS — F32A Depression, unspecified: Secondary | ICD-10-CM | POA: Diagnosis not present

## 2020-08-01 LAB — RESP PANEL BY RT-PCR (FLU A&B, COVID) ARPGX2
Influenza A by PCR: NEGATIVE
Influenza B by PCR: NEGATIVE
SARS Coronavirus 2 by RT PCR: NEGATIVE

## 2020-08-01 LAB — COMPREHENSIVE METABOLIC PANEL
ALT: 36 U/L (ref 0–44)
AST: 39 U/L (ref 15–41)
Albumin: 4.2 g/dL (ref 3.5–5.0)
Alkaline Phosphatase: 71 U/L (ref 38–126)
Anion gap: 22 — ABNORMAL HIGH (ref 5–15)
BUN: 5 mg/dL — ABNORMAL LOW (ref 6–20)
CO2: 19 mmol/L — ABNORMAL LOW (ref 22–32)
Calcium: 8.8 mg/dL — ABNORMAL LOW (ref 8.9–10.3)
Chloride: 103 mmol/L (ref 98–111)
Creatinine, Ser: 0.6 mg/dL (ref 0.44–1.00)
GFR, Estimated: >60 mL/min (ref >60)
Glucose, Bld: 70 mg/dL (ref 70–99)
Potassium: 3.4 mmol/L — ABNORMAL LOW (ref 3.5–5.1)
Sodium: 144 mmol/L (ref 135–145)
Total Bilirubin: 0.4 mg/dL (ref 0.3–1.2)
Total Protein: 7.8 g/dL (ref 6.5–8.1)

## 2020-08-01 LAB — CBC WITH DIFFERENTIAL/PLATELET
Abs Immature Granulocytes: 0.06 10*3/uL (ref 0.00–0.07)
Basophils Absolute: 0.1 10*3/uL (ref 0.0–0.1)
Basophils Relative: 2 %
Eosinophils Absolute: 0.1 10*3/uL (ref 0.0–0.5)
Eosinophils Relative: 2 %
HCT: 43.3 % (ref 36.0–46.0)
Hemoglobin: 14.6 g/dL (ref 12.0–15.0)
Immature Granulocytes: 1 %
Lymphocytes Relative: 53 %
Lymphs Abs: 2.5 10*3/uL (ref 0.7–4.0)
MCH: 33.9 pg (ref 26.0–34.0)
MCHC: 33.7 g/dL (ref 30.0–36.0)
MCV: 100.5 fL — ABNORMAL HIGH (ref 80.0–100.0)
Monocytes Absolute: 0.2 10*3/uL (ref 0.1–1.0)
Monocytes Relative: 5 %
Neutro Abs: 1.7 10*3/uL (ref 1.7–7.7)
Neutrophils Relative %: 37 %
Platelets: UNDETERMINED 10*3/uL (ref 150–400)
RBC: 4.31 MIL/uL (ref 3.87–5.11)
RDW: 15.7 % — ABNORMAL HIGH (ref 11.5–15.5)
WBC: 4.7 10*3/uL (ref 4.0–10.5)
nRBC: 0 % (ref 0.0–0.2)

## 2020-08-01 LAB — ETHANOL: Alcohol, Ethyl (B): 363 mg/dL (ref <10)

## 2020-08-01 MED ORDER — TRAZODONE HCL 50 MG PO TABS: 50.0000 mg | ORAL_TABLET | Freq: Every evening | ORAL | 0 refills | Status: DC | PRN

## 2020-08-01 NOTE — ED Notes (Signed)
Pt presents with suicidal ideations, plan to cut self with knife.  Denies HI, hears voices and is seeing bugs.  A&O x 4, no distress noted, calm & cooperative.  Skin search completed.  Monitoring for safety.

## 2020-08-01 NOTE — ED Notes (Signed)
Discharge instructions provided, samples and prescriptions. Pt stated understanding. Safe transport called for transportation services to be provided for [employer]. Personal belongings returned prior to discharge. Pt escorted to [place]. Pt alert x4 and ambulatory. Safety maintained.

## 2020-08-01 NOTE — ED Notes (Signed)
Breakfast given - blueberry muffin
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
## 2020-08-01 NOTE — ED Notes (Signed)
Pt resting at present, no distress noted.  Monitoring for safety. 

## 2020-08-01 NOTE — ED Notes (Signed)
Pt sleeping at present, no distress noted, monitoring for safety. 

## 2020-08-01 NOTE — Discharge Instructions (Signed)

## 2020-08-01 NOTE — ED Notes (Signed)
LAB CALLED TO REPORT ALCOHOL LEVEL OF 363.  NP JASON BERRY NOTIFIED.

## 2020-09-05 ENCOUNTER — Inpatient Hospital Stay (INDEPENDENT_AMBULATORY_CARE_PROVIDER_SITE_OTHER): Payer: Medicaid Other | Admitting: Primary Care

## 2021-03-20 IMAGING — DX DG CHEST 2V
2 series · 2 of 2 positions shown · non-contrast
Comparison: None.

CLINICAL DATA: Shortness of breath 30 minutes

EXAM:
CHEST - 2 VIEW

[chest pa]
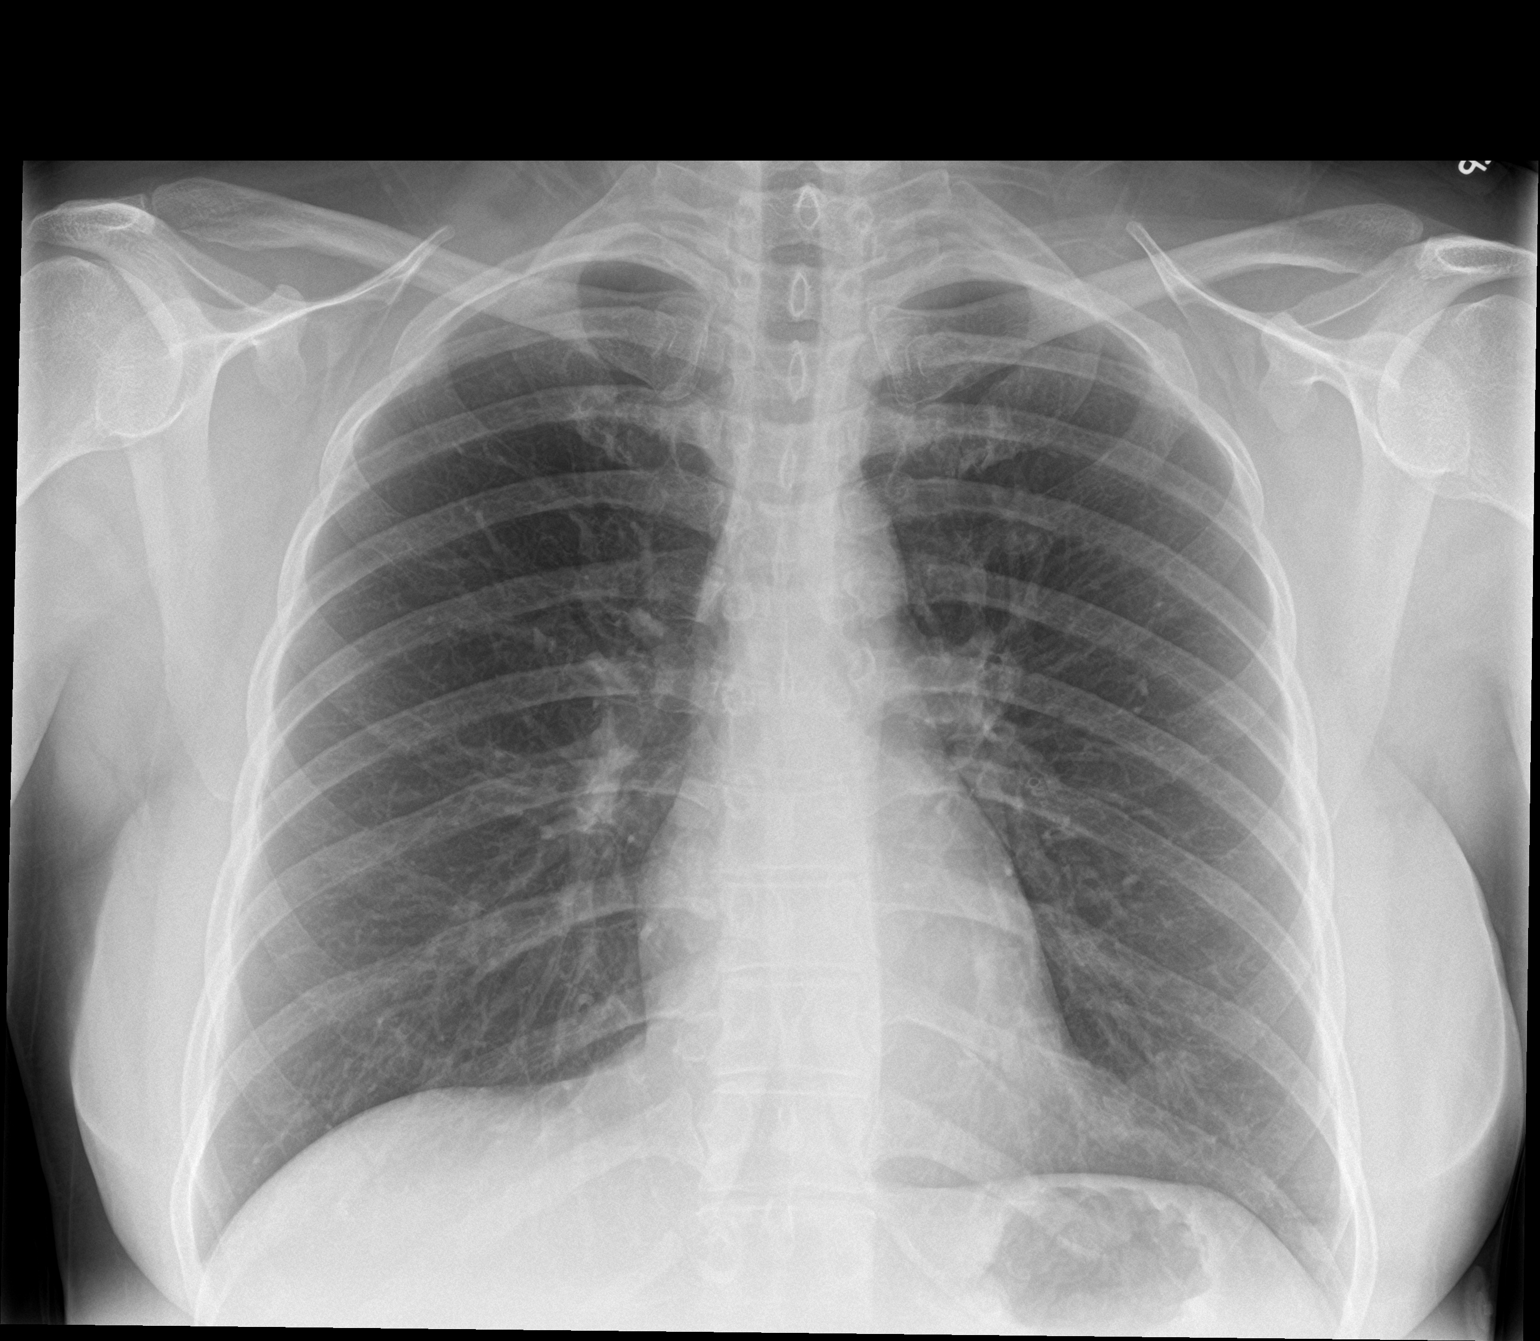

[chest lat]
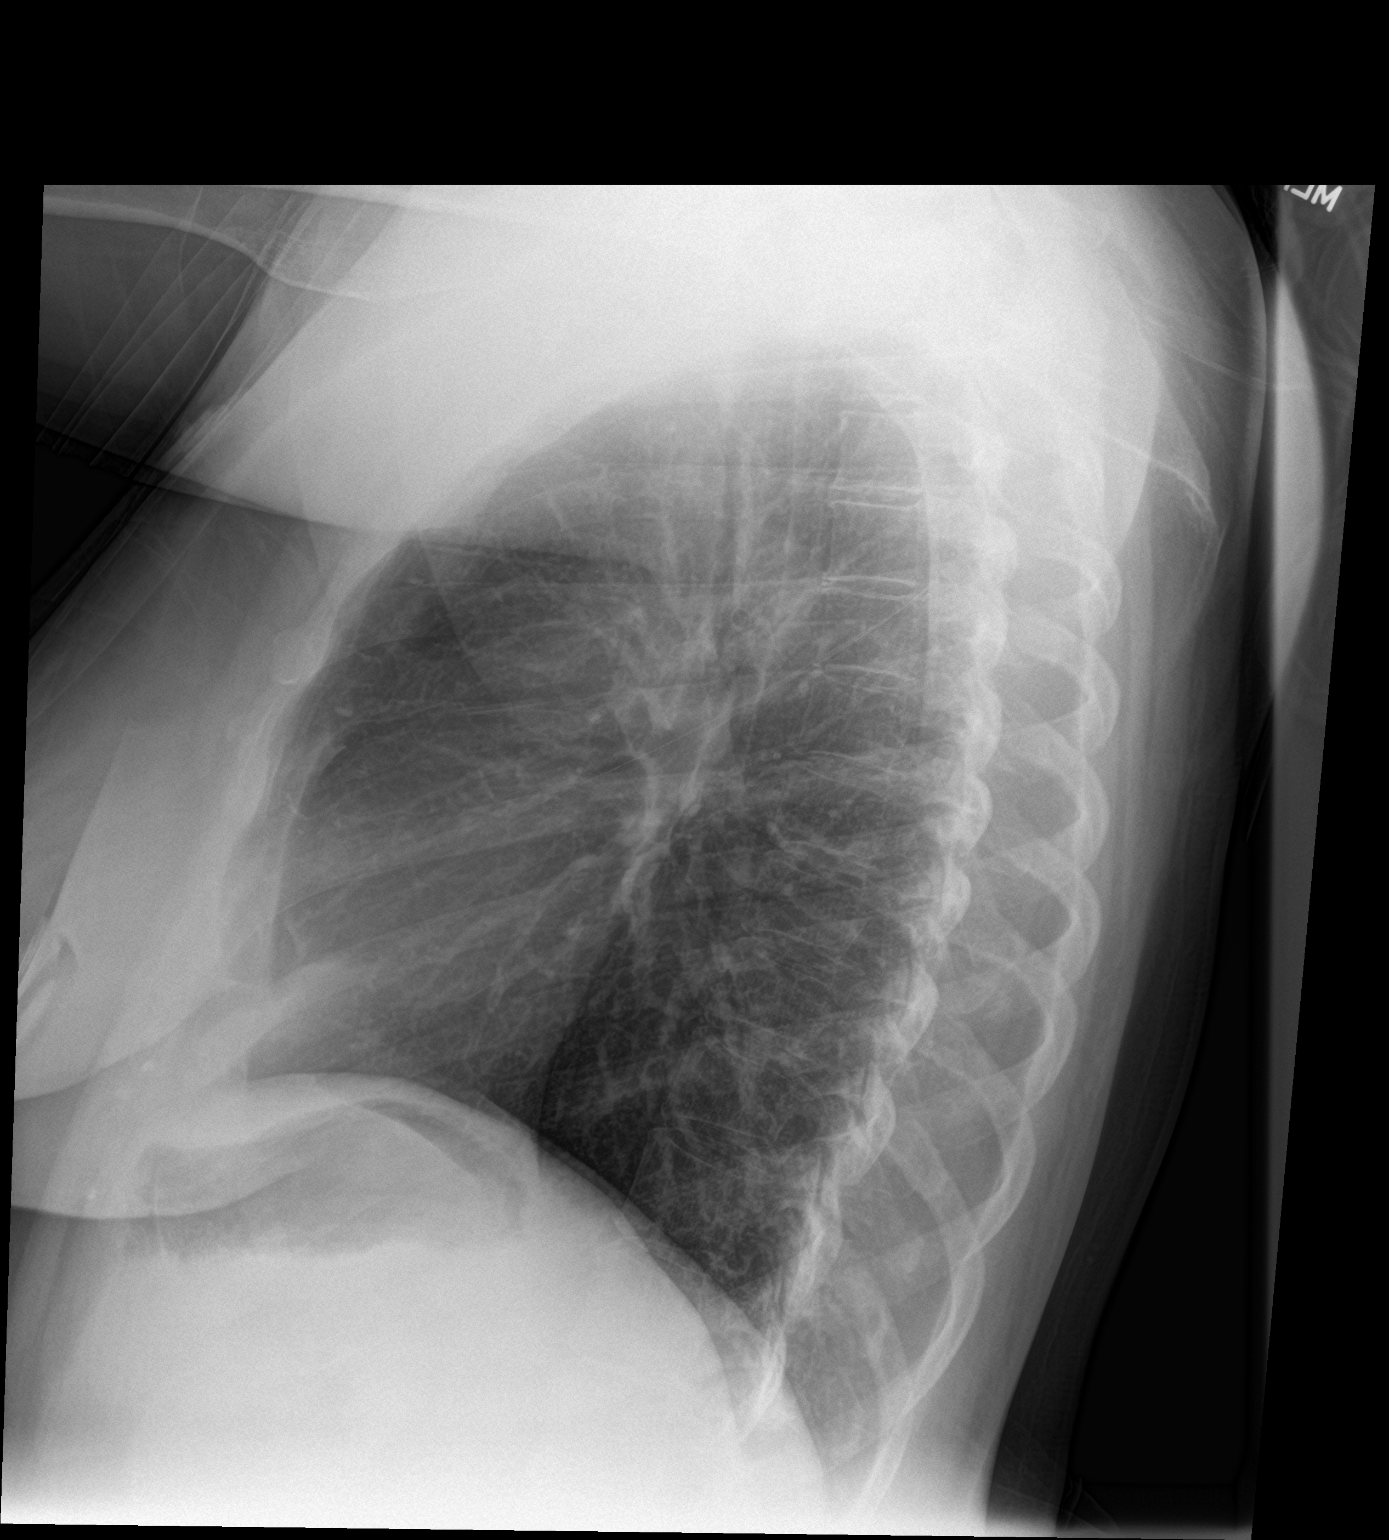

[2 of 2 positions shown; findings below may reference images not displayed]

FINDINGS: The heart size and mediastinal contours are within normal limits.
Both lungs are clear. The visualized skeletal structures are
unremarkable.
IMPRESSION: No active cardiopulmonary disease.

## 2021-05-06 ENCOUNTER — Other Ambulatory Visit (HOSPITAL_COMMUNITY)
Admission: RE | Admit: 2021-05-06 | Discharge: 2021-05-06 | Disposition: A | Discharge status: Home / Self Care | Payer: Medicaid Other | Source: Ambulatory Visit | Attending: Primary Care | Admitting: Primary Care

## 2021-05-06 ENCOUNTER — Other Ambulatory Visit: Payer: Self-pay

## 2021-05-06 ENCOUNTER — Ambulatory Visit (INDEPENDENT_AMBULATORY_CARE_PROVIDER_SITE_OTHER): Payer: Medicaid Other | Admitting: Primary Care

## 2021-05-06 ENCOUNTER — Encounter (INDEPENDENT_AMBULATORY_CARE_PROVIDER_SITE_OTHER): Payer: Self-pay | Admitting: Primary Care

## 2021-05-06 VITALS — BP 122/83 | HR 80 | Temp 97.5°F | Ht 64.0 in | Wt 216.0 lb

## 2021-05-06 DIAGNOSIS — Z113 Encounter for screening for infections with a predominantly sexual mode of transmission: Secondary | ICD-10-CM | POA: Insufficient documentation

## 2021-05-06 DIAGNOSIS — Z23 Encounter for immunization: Secondary | ICD-10-CM | POA: Diagnosis not present

## 2021-05-06 NOTE — Patient Instructions (Signed)
Tdap (Tetanus, Diphtheria, Pertussis) Vaccine: What You Need to Know 1. Why get vaccinated? Tdap vaccine can prevent tetanus, diphtheria, and pertussis. Diphtheria and pertussis spread from person to person. Tetanus enters the body through cuts or wounds. TETANUS (T) causes painful stiffening of the muscles. Tetanus can lead to serious health problems, including being unable to open the mouth, having trouble swallowing and breathing, or death. DIPHTHERIA (D) can lead to difficulty breathing, heart failure, paralysis, or death. PERTUSSIS (aP), also known as "whooping cough," can cause uncontrollable, violent coughing that makes it hard to breathe, eat, or drink. Pertussis can be extremely serious especially in babies and young children, causing pneumonia, convulsions, brain damage, or death. In teens and adults, it can cause weight loss, loss of bladder control, passing out, and rib fractures from severe coughing. 2. Tdap vaccine Tdap is only for children 7 years and older, adolescents, and adults.  Adolescents should receive a single dose of Tdap, preferably at age 11 or 12 years. Pregnant people should get a dose of Tdap during every pregnancy, preferably during the early part of the third trimester, to help protect the newborn from pertussis. Infants are most at risk for severe, life-threatening complications frompertussis. Adults who have never received Tdap should get a dose of Tdap. Also, adults should receive a booster dose of either Tdap or Td (a different vaccine that protects against tetanus and diphtheria but not pertussis) every 10 years, or after 5 years in the case of a severe or dirty wound or burn. Tdap may be given at the same time as other vaccines. 3. Talk with your health care provider Tell your vaccine provider if the person getting the vaccine: Has had an allergic reaction after a previous dose of any vaccine that protects against tetanus, diphtheria, or pertussis, or has any  severe, life-threatening allergies Has had a coma, decreased level of consciousness, or prolonged seizures within 7 days after a previous dose of any pertussis vaccine (DTP, DTaP, or Tdap) Has seizures or another nervous system problem Has ever had Guillain-Barr Syndrome (also called "GBS") Has had severe pain or swelling after a previous dose of any vaccine that protects against tetanus or diphtheria In some cases, your health care provider may decide to postpone Tdapvaccination until a future visit. People with minor illnesses, such as a cold, may be vaccinated. People who are moderately or severely ill should usually wait until they recover beforegetting Tdap vaccine.  Your health care provider can give you more information. 4. Risks of a vaccine reaction Pain, redness, or swelling where the shot was given, mild fever, headache, feeling tired, and nausea, vomiting, diarrhea, or stomachache sometimes happen after Tdap vaccination. People sometimes faint after medical procedures, including vaccination. Tellyour provider if you feel dizzy or have vision changes or ringing in the ears.  As with any medicine, there is a very remote chance of a vaccine causing asevere allergic reaction, other serious injury, or death. 5. What if there is a serious problem? An allergic reaction could occur after the vaccinated person leaves the clinic. If you see signs of a severe allergic reaction (hives, swelling of the face and throat, difficulty breathing, a fast heartbeat, dizziness, or weakness), call 9-1-1and get the person to the nearest hospital. For other signs that concern you, call your health care provider.  Adverse reactions should be reported to the Vaccine Adverse Event Reporting System (VAERS). Your health care provider will usually file this report, or you can do it yourself. Visit the   VAERS website at www.vaers.hhs.gov or call 1-800-822-7967. VAERS is only for reporting reactions, and VAERS staff  members do not give medical advice. 6. The National Vaccine Injury Compensation Program The National Vaccine Injury Compensation Program (VICP) is a federal program that was created to compensate people who may have been injured by certain vaccines. Claims regarding alleged injury or death due to vaccination have a time limit for filing, which may be as short as two years. Visit the VICP website at www.hrsa.gov/vaccinecompensation or call 1-800-338-2382to learn about the program and about filing a claim. 7. How can I learn more? Ask your health care provider. Call your local or state health department. Visit the website of the Food and Drug Administration (FDA) for vaccine package inserts and additional information at www.fda.gov/vaccines-blood-biologics/vaccines. Contact the Centers for Disease Control and Prevention (CDC): Call 1-800-232-4636 (1-800-CDC-INFO) or Visit CDC's website at www.cdc.gov/vaccines. Vaccine Information Statement Tdap (Tetanus, Diphtheria, Pertussis) Vaccine(03/21/2020) This information is not intended to replace advice given to you by your health care provider. Make sure you discuss any questions you have with your healthcare provider. Document Revised: 04/16/2020 Document Reviewed: 04/16/2020 Elsevier Patient Education  2022 Elsevier Inc.  

## 2021-05-06 NOTE — Progress Notes (Signed)
[facility]  History of Present Illness   Patient Identification Michele Rich is a 35 y.o. female.  Patient information was obtained from patient. History/Exam limitations: none.  Chief Complaint  STD check   Patient presents with a complaint of vaginal discharge. Onset of symptoms was vaginal irritation  2 months ago, and has been unchanged since that time. The patient describes the discharge as clear and does experience abdominal discomfort with the discharge. She does not complain of burning with urination. She denies genital lesions at this time. The patient does  have a history of STD's or PID and denies multiple sexual partners. She is not homosexual.   Family History  Family history unknown: Yes   Scheduled Meds: Continuous Infusions: PRN Meds:  Allergies  Allergen Reactions   Apple Fruit Extract Hives    Only applesauce, pt can eat apples   Social History   Socioeconomic History   Marital status: Single    Spouse name: Not on file   Number of children: Not on file   Years of education: Not on file   Highest education level: Not on file  Occupational History   Occupation: unemployed  Tobacco Use   Smoking status: Every Day    Packs/day: 0.10    Types: Cigarettes   Smokeless tobacco: Never   Tobacco comments:    one cigarette per day  Vaping Use   Vaping Use: Never used  Substance and Sexual Activity   Alcohol use: Yes    Comment: daily   Drug use: No   Sexual activity: Yes    Birth control/protection: None  Other Topics Concern   Not on file  Social History Narrative   Not on file   Social Determinants of Health   Financial Resource Strain: Not on file  Food Insecurity: Not on file  Transportation Needs: Not on file  Physical Activity: Not on file  Stress: Not on file  Social Connections: Not on file  Intimate Partner Violence: Not on file   Review of Systems Pertinent items noted in HPI and remainder of  comprehensive ROS otherwise negative.   Physical Exam   BP 122/83 (BP Location: Right Arm, Patient Position: Sitting, Cuff Size: Normal)   Pulse 80   Temp (!) 97.5 F (36.4 C) (Temporal)   Ht 5\' 4"  (1.626 m)   Wt 216 lb (98 kg)   LMP 04/25/2021 (Exact Date)   SpO2 96%   BMI 37.08 kg/m  General:   alert, cooperative, appears stated age, no distress, and moderately obese  Heart: regular rate and rhythm, S1, S2 normal, no murmur, click, rub or gallop  Lungs: clear to auscultation bilaterally  Abdomen: soft, non-tender, without masses or organomegaly General: No apparent distress. Eyes: Extraocular eye movements intact, pupils equal and round. Neck: Supple, trachea midline. TMusculoskeletal: Normal muscle tone, no tenderness on palpation of tibia, no excessive thoracic kyphosis. Skin: Appropriate warmth, no visible rash. Mental status: Alert, conversant, speech clear, thought logical, appropriate mood and affect, no hallucinations or delusions evident. Hematologic/lymphatic: No cervical adenopathy, no visible ecchymoses.   Studies: Lab: ancillary cytology   Records Reviewed: Old medical records. Nursing notes. Michele Rich was seen today for std check.  Diagnoses and all orders for this visit:  Need for Tdap vaccination -     Tdap vaccine greater than or equal to 7yo IM  Screening for STD (sexually transmitted disease) -     Cervicovaginal ancillary only  This note has been created  with Education officer, environmental. Any transcriptional errors are unintentional.  Grayce Sessions Np

## 2021-05-07 LAB — CERVICOVAGINAL ANCILLARY ONLY
Bacterial Vaginitis (gardnerella): NEGATIVE
Candida Glabrata: NEGATIVE
Candida Vaginitis: NEGATIVE
Chlamydia: NEGATIVE
Comment: NEGATIVE
Comment: NEGATIVE
Comment: NEGATIVE
Comment: NEGATIVE
Comment: NEGATIVE
Comment: NORMAL
Neisseria Gonorrhea: NEGATIVE
Trichomonas: NEGATIVE

## 2021-06-10 ENCOUNTER — Ambulatory Visit (INDEPENDENT_AMBULATORY_CARE_PROVIDER_SITE_OTHER): Payer: Self-pay

## 2021-06-10 NOTE — Telephone Encounter (Signed)
Pt. Reports she started having nausea, vomiting, diarrhea 3 days ago. "It's worse today." Vomiting 4-5 x day. Keeping fluids down. No signs of dehydration. Requesting office visit or virtual visit. Will see another provider if possible. No availability with her PCP. Please advise pt.       Reason for Disposition  [1] MILD or MODERATE vomiting AND [2] present > 48 hours (2 days) (Exception: mild vomiting with associated diarrhea)  Answer Assessment - Initial Assessment Questions 1. VOMITING SEVERITY: "How many times have you vomited in the past 24 hours?"     - MILD:  1 - 2 times/day    - MODERATE: 3 - 5 times/day, decreased oral intake without significant weight loss or symptoms of dehydration    - SEVERE: 6 or more times/day, vomits everything or nearly everything, with significant weight loss, symptoms of dehydration      4-5 2. ONSET: "When did the vomiting begin?"      3 days 3. FLUIDS: "What fluids or food have you vomited up today?" "Have you been able to keep any fluids down?"     Yes 4. ABDOMINAL PAIN: "Are your having any abdominal pain?" If yes : "How bad is it and what does it feel like?" (e.g., crampy, dull, intermittent, constant)      No 5. DIARRHEA: "Is there any diarrhea?" If Yes, ask: "How many times today?"      Yes 6. CONTACTS: "Is there anyone else in the family with the same symptoms?"      No 7. CAUSE: "What do you think is causing your vomiting?"     Unsure 8. HYDRATION STATUS: "Any signs of dehydration?" (e.g., dry mouth [not only dry lips], too weak to stand) "When did you last urinate?"     No 9. OTHER SYMPTOMS: "Do you have any other symptoms?" (e.g., fever, headache, vertigo, vomiting blood or coffee grounds, recent head injury)     Headache 10. PREGNANCY: "Is there any chance you are pregnant?" "When was your last menstrual period?"       No  Protocols used: Vomiting-A-AH

## 2021-06-11 ENCOUNTER — Ambulatory Visit (INDEPENDENT_AMBULATORY_CARE_PROVIDER_SITE_OTHER): Payer: Medicaid Other | Admitting: Nurse Practitioner

## 2021-06-11 ENCOUNTER — Other Ambulatory Visit: Payer: Self-pay

## 2021-06-11 ENCOUNTER — Encounter: Payer: Self-pay | Admitting: Nurse Practitioner

## 2021-06-11 DIAGNOSIS — R112 Nausea with vomiting, unspecified: Secondary | ICD-10-CM

## 2021-06-11 MED ORDER — ONDANSETRON 4 MG PO TBDP: 4.0000 mg | ORAL_TABLET | Freq: Three times a day (TID) | ORAL | 0 refills | Status: DC | PRN

## 2021-06-11 NOTE — Progress Notes (Signed)
Virtual Visit via Telephone Note  I connected with Michele Rich on 06/11/21 at 10:00 AM EDT by telephone and verified that I am speaking with the correct person using two identifiers.  Location: Patient: home Provider: office   I discussed the limitations, risks, security and privacy concerns of performing an evaluation and management service by telephone and the availability of in person appointments. I also discussed with the patient that there may be a patient responsible charge related to this service. The patient expressed understanding and agreed to proceed.   History of Present Illness:  Patient presents today for televisit for nausea vomiting and diarrhea.  She states that her symptoms started this past Monday.  She has been trying to keep fluids down.  She did eat a small amount this morning without vomiting.  She states that she has had a headache also.  She did do a home COVID test which was negative.  She denies any significant abdominal pain. Denies f/c/s, n/v/d, hemoptysis, PND, leg swelling.       Observations/Objective:  Vitals with BMI 05/06/2021 07/17/2020 07/17/2020  Height 5\' 4"  - -  Weight 216 lbs - -  BMI 37.06 - -  Systolic 122 139  Diastolic 83 92 85  Pulse 80 74 78  Some encounter information is confidential and restricted. Go to Review Flowsheets activity to see all data.      Assessment and Plan:  Patient Instructions  Viral Illness:  Stay well hydrated  Eat bland food  Will order Zofran for nausea  Follow up:  Follow up if needed  Nausea and Vomiting, Adult Nausea is feeling sick to your stomach or feeling that you are about to throw up (vomit). Vomiting is when food in your stomach is thrown up and out of the mouth. Throwing up can make you feel weak. It can also make you lose too much water in your body (get dehydrated). If you lose too much water in your body, you may: Feel tired. Feel thirsty. Have a dry mouth. Have cracked  lips. Go pee (urinate) less often. Older adults and people with other diseases or a weak body defense system (immune system) are at higher risk for losing too much water in the body. If you feel sick to your stomach and you throw up, it is important to follow instructions from your doctor about how to take care of yourself. Follow these instructions at home: Watch your symptoms for any changes. Tell your doctor about them. Follow these instructions to care for yourself at home. Eating and drinking   Take an ORS (oral rehydration solution). This is a drink that is sold at pharmacies and stores. Drink clear fluids in small amounts as you are able, such as: Water. Ice chips. Fruit juice that has water added (diluted fruit juice). Low-calorie sports drinks. Eat bland, easy-to-digest foods in small amounts as you are able, such as: Bananas. Applesauce. Rice. Low-fat (lean) meats. Toast. Crackers. Avoid drinking fluids that have a lot of sugar or caffeine in them. This includes energy drinks, sports drinks, and soda. Avoid alcohol. Avoid spicy or fatty foods. General instructions Take over-the-counter and prescription medicines only as told by your doctor. Drink enough fluid to keep your pee (urine) pale yellow. Wash your hands often with soap and water. If you cannot use soap and water, use hand sanitizer. Make sure that all people in your home wash their hands well and often. Rest at home while you get better. Watch your  condition for any changes. Take slow and deep breaths when you feel sick to your stomach. Keep all follow-up visits as told by your doctor. This is important. Contact a doctor if: Your symptoms get worse. You have new symptoms. You have a fever. You cannot drink fluids without throwing up. You feel sick to your stomach for more than 2 days. You feel light-headed or dizzy. You have a headache. You have muscle cramps. You have a rash. You have pain while  peeing. Get help right away if: You have pain in your chest, neck, arm, or jaw. You feel very weak or you pass out (faint). You throw up again and again. You have throw up that is bright red or looks like black coffee grounds. You have bloody or black poop (stools) or poop that looks like tar. You have a very bad headache, a stiff neck, or both. You have very bad pain, cramping, or bloating in your belly (abdomen). You have trouble breathing. You are breathing very quickly. Your heart is beating very quickly. Your skin feels cold and clammy. You feel confused. You have signs of losing too much water in your body, such as: Dark pee, very little pee, or no pee. Cracked lips. Dry mouth. Sunken eyes. Sleepiness. Weakness. These symptoms may be an emergency. Do not wait to see if the symptoms will go away. Get medical help right away. Call your local emergency services (911 in the [place] Do not drive yourself to the hospital. Summary Nausea is feeling sick to your stomach or feeling that you are about to throw up (vomit). Vomiting is when food in your stomach is thrown up and out of the mouth. Follow instructions from your doctor about eating and drinking to keep from losing too much water in your body. Take over-the-counter and prescription medicines only as told by your doctor. Contact your doctor if your symptoms get worse or you have new symptoms. Keep all follow-up visits as told by your doctor. This is important. This information is not intended to replace advice given to you by your health care provider. Make sure you discuss any questions you have with your health care provider. Document Revised: 10/22/2020 Document Reviewed: 01/10/2018 Elsevier Patient Education  2022 [employer].      I discussed the assessment and treatment plan with the patient. The patient was provided an opportunity to ask questions and all were answered. The patient agreed with the plan and demonstrated  an understanding of the instructions.   The patient was advised to call back or seek an in-person evaluation if the symptoms worsen or if the condition fails to improve as anticipated.  I provided 23 minutes of non-face-to-face time during this encounter.   Ivonne Andrew, NP

## 2021-06-11 NOTE — Patient Instructions (Signed)
Viral Illness:  Stay well hydrated  Eat bland food  Will order Zofran for nausea  Follow up:  Follow up if needed  Nausea and Vomiting, Adult Nausea is feeling sick to your stomach or feeling that you are about to throw up (vomit). Vomiting is when food in your stomach is thrown up and out of the mouth. Throwing up can make you feel weak. It can also make you lose too much water in your body (get dehydrated). If you lose too much water in your body, you may: Feel tired. Feel thirsty. Have a dry mouth. Have cracked lips. Go pee (urinate) less often. Older adults and people with other diseases or a weak body defense system (immune system) are at higher risk for losing too much water in the body. If you feel sick to your stomach and you throw up, it is important to follow instructions from your doctor about how to take care of yourself. Follow these instructions at home: Watch your symptoms for any changes. Tell your doctor about them. Follow these instructions to care for yourself at home. Eating and drinking   Take an ORS (oral rehydration solution). This is a drink that is sold at pharmacies and stores. Drink clear fluids in small amounts as you are able, such as: Water. Ice chips. Fruit juice that has water added (diluted fruit juice). Low-calorie sports drinks. Eat bland, easy-to-digest foods in small amounts as you are able, such as: Bananas. Applesauce. Rice. Low-fat (lean) meats. Toast. Crackers. Avoid drinking fluids that have a lot of sugar or caffeine in them. This includes energy drinks, sports drinks, and soda. Avoid alcohol. Avoid spicy or fatty foods. General instructions Take over-the-counter and prescription medicines only as told by your doctor. Drink enough fluid to keep your pee (urine) pale yellow. Wash your hands often with soap and water. If you cannot use soap and water, use hand sanitizer. Make sure that all people in your home wash their hands well  and often. Rest at home while you get better. Watch your condition for any changes. Take slow and deep breaths when you feel sick to your stomach. Keep all follow-up visits as told by your doctor. This is important. Contact a doctor if: Your symptoms get worse. You have new symptoms. You have a fever. You cannot drink fluids without throwing up. You feel sick to your stomach for more than 2 days. You feel light-headed or dizzy. You have a headache. You have muscle cramps. You have a rash. You have pain while peeing. Get help right away if: You have pain in your chest, neck, arm, or jaw. You feel very weak or you pass out (faint). You throw up again and again. You have throw up that is bright red or looks like black coffee grounds. You have bloody or black poop (stools) or poop that looks like tar. You have a very bad headache, a stiff neck, or both. You have very bad pain, cramping, or bloating in your belly (abdomen). You have trouble breathing. You are breathing very quickly. Your heart is beating very quickly. Your skin feels cold and clammy. You feel confused. You have signs of losing too much water in your body, such as: Dark pee, very little pee, or no pee. Cracked lips. Dry mouth. Sunken eyes. Sleepiness. Weakness. These symptoms may be an emergency. Do not wait to see if the symptoms will go away. Get medical help right away. Call your local emergency services (911 in the [place] Do  not drive yourself to the hospital. Summary Nausea is feeling sick to your stomach or feeling that you are about to throw up (vomit). Vomiting is when food in your stomach is thrown up and out of the mouth. Follow instructions from your doctor about eating and drinking to keep from losing too much water in your body. Take over-the-counter and prescription medicines only as told by your doctor. Contact your doctor if your symptoms get worse or you have new symptoms. Keep all follow-up  visits as told by your doctor. This is important. This information is not intended to replace advice given to you by your health care provider. Make sure you discuss any questions you have with your health care provider. Document Revised: 10/22/2020 Document Reviewed: 01/10/2018 Elsevier Patient Education  2022 [employer].

## 2021-09-25 IMAGING — US US ABDOMEN COMPLETE
1 series · 14 of 25 positions shown · non-contrast
Comparison: None.

CLINICAL DATA: Alcohol abuse, pancreatitis, abdominal pain

EXAM:
ABDOMEN ULTRASOUND COMPLETE

[Series 1: us abdomen complete · 14 of 81 slices shown]
[im 1/81]
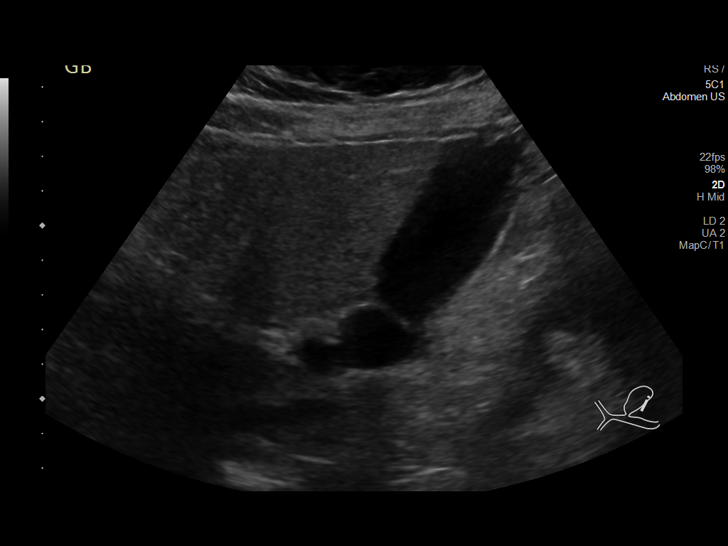
[im 7/81]
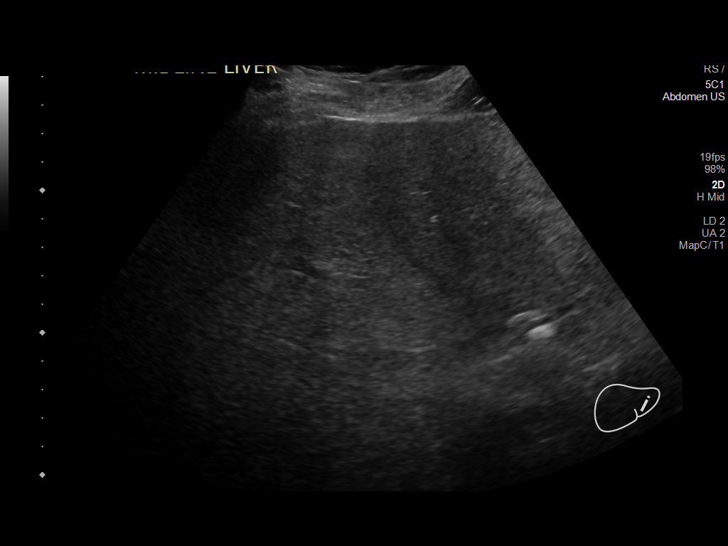
[im 14/81]
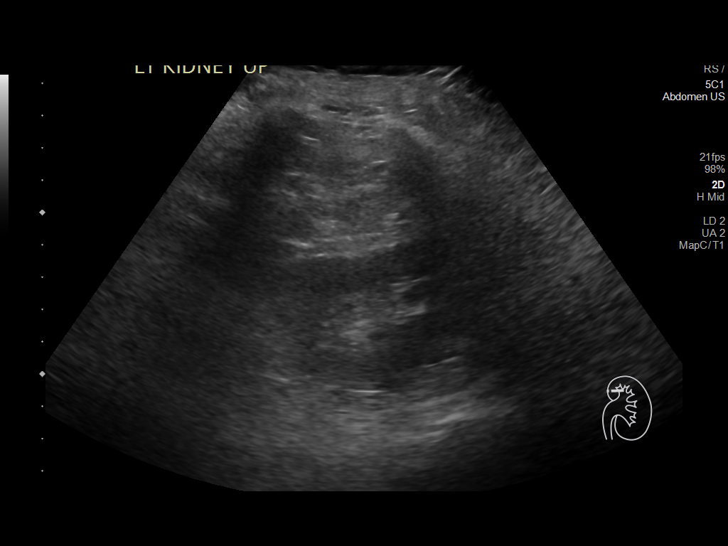
[im 21/81]
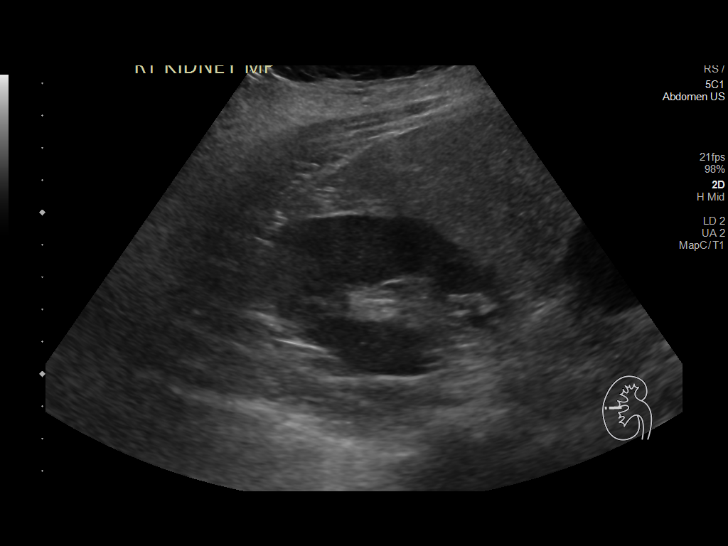
[im 27/81]
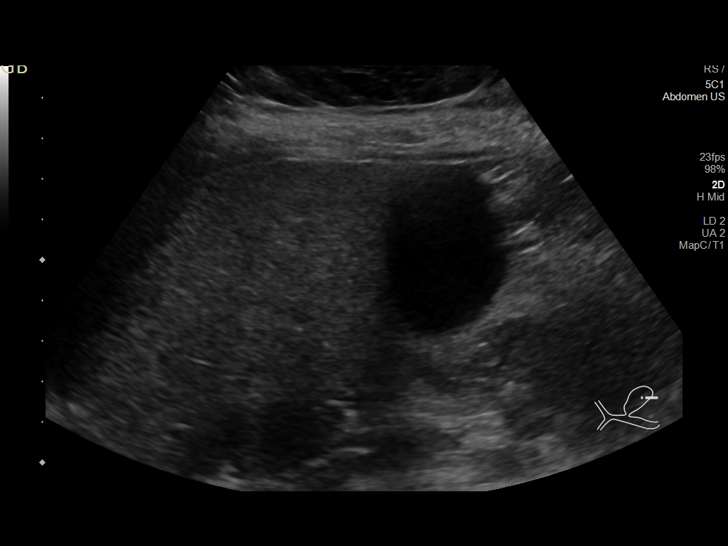
[im 31/81]
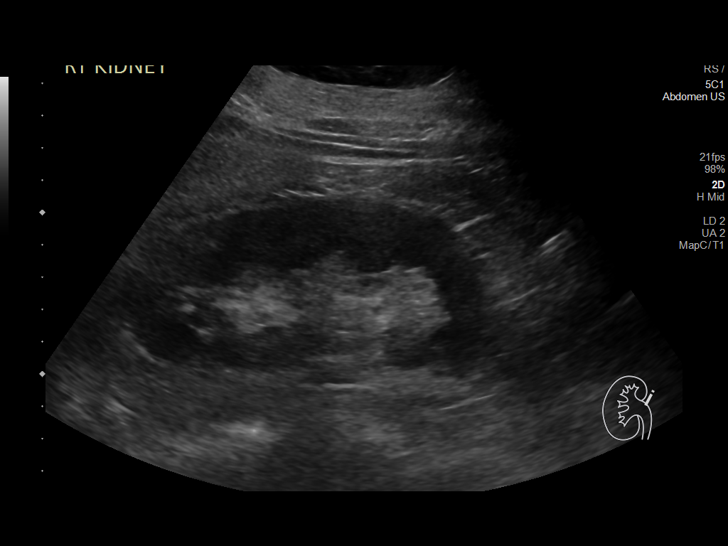
[im 37/81]
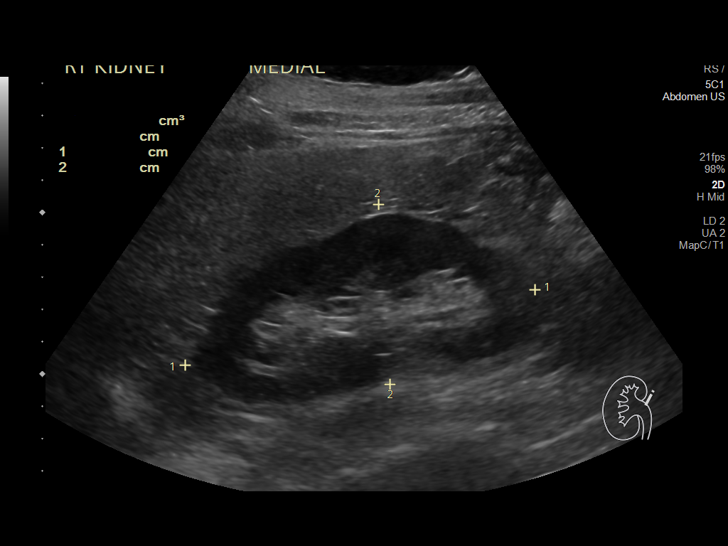
[im 44/81]
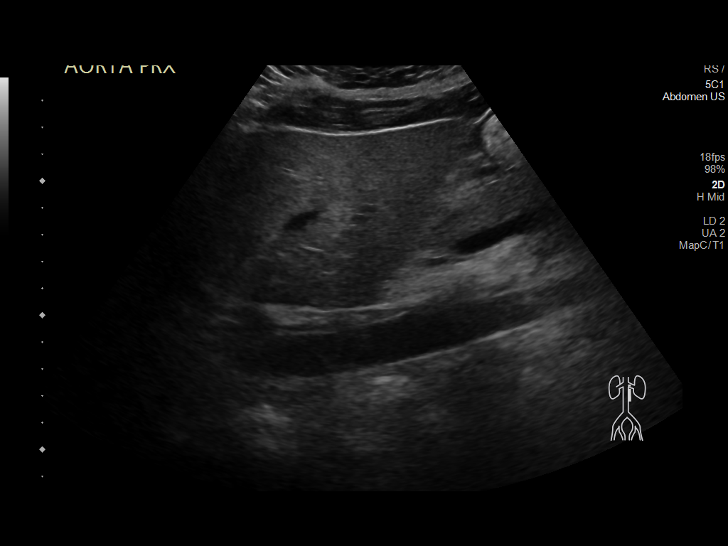
[im 51/81]
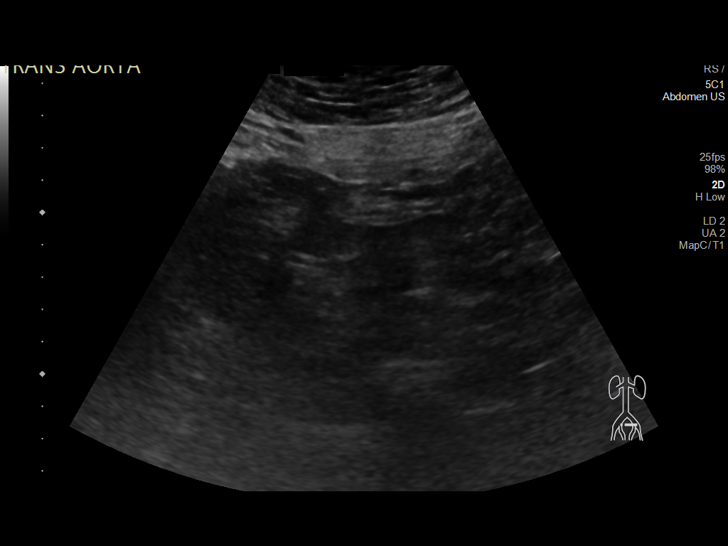
[im 54/81]
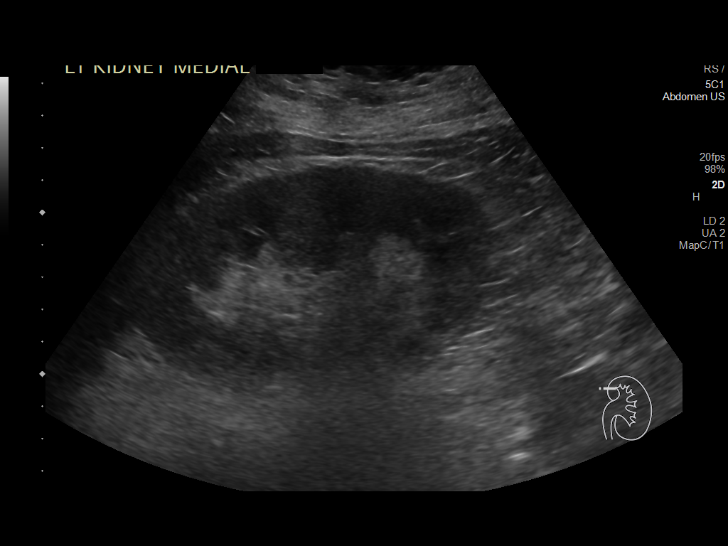
[im 61/81]
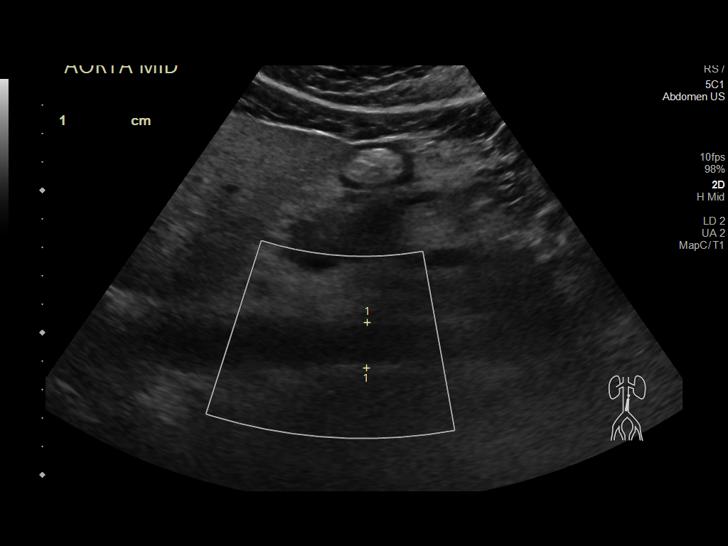
[im 67/81]
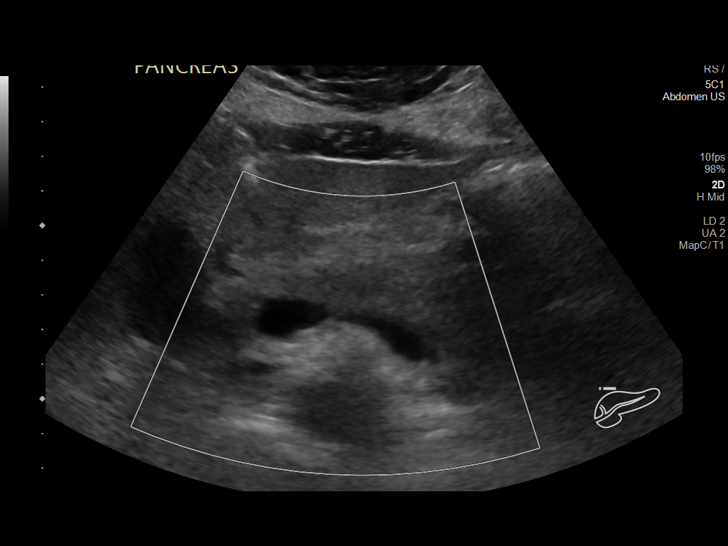
[im 74/81]
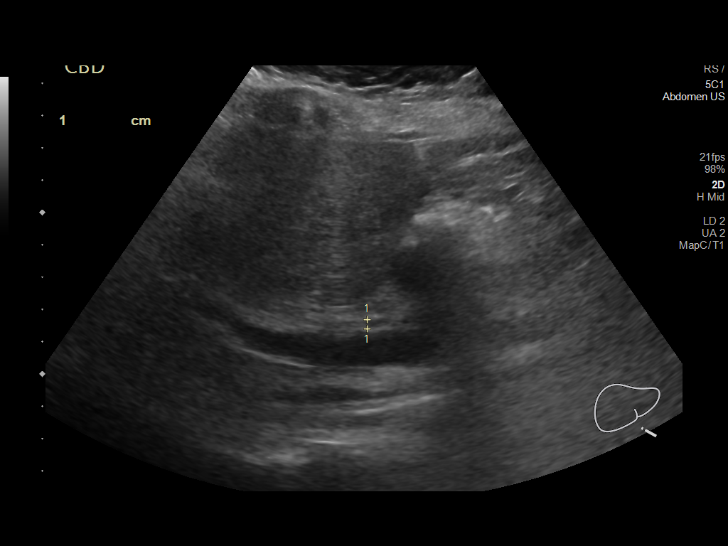
[im 81/81]
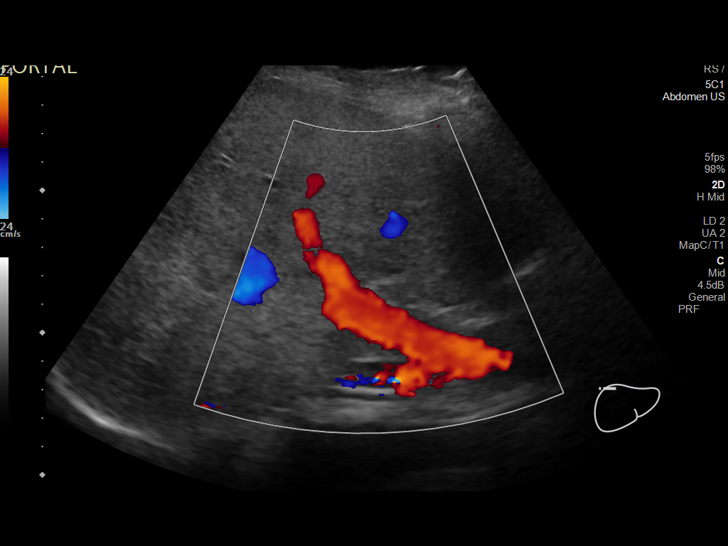

[14 of 25 positions shown; findings below may reference images not displayed]

FINDINGS: Gallbladder: No gallstones or wall thickening visualized. No
sonographic Murphy sign noted by sonographer.

Common bile duct: Diameter: 3 mm in proximal diameter

Liver: The hepatic parenchyma is diffusely echogenic and there is
coarsening of the hepatic echotexture in keeping with changes of at
least moderate hepatic steatosis. No focal intrahepatic masses are
seen. There is no intrahepatic biliary ductal dilation. Portal vein
is patent on color Doppler imaging with normal direction of blood
flow towards the liver.

IVC: No abnormality visualized.

Pancreas: Visualized portion unremarkable.

Spleen: Size and appearance within normal limits.

Right Kidney: Length: 7.1 x 5.6 x 11.1 cm. Echogenicity within
normal limits. No mass or hydronephrosis visualized.

Left Kidney: Length: 7.0 x 11.8 cm. Echogenicity within normal
limits. No mass or hydronephrosis visualized.

Abdominal aorta: No aneurysm visualized.

Other findings: None.
IMPRESSION: Moderate hepatic steatosis.

## 2021-12-30 ENCOUNTER — Ambulatory Visit (INDEPENDENT_AMBULATORY_CARE_PROVIDER_SITE_OTHER): Payer: Medicaid Other | Admitting: Primary Care

## 2021-12-30 VITALS — BP 128/78 | HR 66 | Temp 98.0°F | Ht 64.0 in | Wt 226.4 lb

## 2021-12-30 DIAGNOSIS — Z3201 Encounter for pregnancy test, result positive: Secondary | ICD-10-CM | POA: Diagnosis not present

## 2021-12-30 DIAGNOSIS — Z30019 Encounter for initial prescription of contraceptives, unspecified: Secondary | ICD-10-CM | POA: Diagnosis not present

## 2021-12-30 LAB — POCT URINE PREGNANCY: Preg Test, Ur: POSITIVE — AB

## 2021-12-30 NOTE — Progress Notes (Signed)
Pt pregnancy test today was positive her LMP was 10/11/21 She recently terminated a pregnancy on 12/14/21 Hcg drawn to check if levels are returning to normal

## 2021-12-31 ENCOUNTER — Other Ambulatory Visit (INDEPENDENT_AMBULATORY_CARE_PROVIDER_SITE_OTHER): Payer: Self-pay | Admitting: Primary Care

## 2021-12-31 DIAGNOSIS — Z30013 Encounter for initial prescription of injectable contraceptive: Secondary | ICD-10-CM

## 2021-12-31 LAB — BETA HCG QUANT (REF LAB): hCG Quant: 178 m[IU]/mL

## 2021-12-31 MED ORDER — MEDROXYPROGESTERONE ACETATE 150 MG/ML IM SUSP
150.0000 mg | Freq: Once | INTRAMUSCULAR | Status: DC
Start: 2022-01-04 — End: 2022-02-11

## 2022-01-01 ENCOUNTER — Ambulatory Visit (INDEPENDENT_AMBULATORY_CARE_PROVIDER_SITE_OTHER): Payer: Medicaid Other

## 2022-01-05 ENCOUNTER — Ambulatory Visit (INDEPENDENT_AMBULATORY_CARE_PROVIDER_SITE_OTHER): Payer: Medicaid Other

## 2022-01-11 NOTE — Progress Notes (Signed)
  [facility]  Michele Rich, is a 36 y.o. female  OIN:[id number]  BSJ:[id number]  DOB - February 23, 1986  Chief Complaint  Patient presents with   Contraception    Would like the patch        Subjective:   Michele Rich is a 36 y.o. female here today for management of birth control.  Her pregnancy test was positive patient reported she just recently terminated a pregnancy. Explained to patient we will have to do hCG if the numbers are indicative of not being pregnant she may return for a Depo shot.  Patient has No headache, No chest pain, No abdominal pain - No Nausea, No new weakness tingling or numbness, No Cough - shortness of breath  No problems updated.  Allergies  Allergen Reactions   Apple Fruit Extract Hives    Only applesauce, pt can eat apples    Past Medical History:  Diagnosis Date   Alcoholism (HCC)    Depression    HPV in female    PTSD (post-traumatic stress disorder)     Current Outpatient Medications on File Prior to Visit  Medication Sig Dispense Refill   escitalopram (LEXAPRO) 10 MG tablet Take 10 mg by mouth daily.     hydrOXYzine (ATARAX/VISTARIL) 25 MG tablet Take 25 mg by mouth 3 (three) times daily as needed.     No current facility-administered medications on file prior to visit.  Comprehensive ROS Pertinent positive and negative noted in HPI    Objective:   Vitals:   12/30/21 1618  BP: 128/78  Pulse: 66  Temp: 98 F (36.7 C)  TempSrc: Oral  SpO2: 98%  Weight: 226 lb 6.4 oz (102.7 kg)  Height: 5\' 4"  (1.626 m)    Exam General appearance : Awake, alert, not in any distress. Speech Clear. Not toxic looking HEENT: Atraumatic and Normocephalic, pupils equally reactive to light and accomodation Neck: Supple, no JVD. No cervical lymphadenopathy.  Chest: Good air entry bilaterally, no added sounds  CVS: S1 S2 regular, no murmurs.  Abdomen: Bowel sounds present, Non tender and not distended with no gaurding, rigidity or  rebound. Extremities: B/L Lower Ext shows no edema, both legs are warm to touch Neurology: Awake alert, and oriented X 3, CN II-XII intact, Non focal Skin: No Rash  Data Review Lab Results  Component Value Date   HGBA1C 4.5 (L) 02/20/2020   HGBA1C 5.1 01/05/2019    Assessment & Plan   1. Encounter for initial prescription of contraceptives, unspecified contraceptive Refer to HPI - POCT urine pregnancy - Beta hCG quant (ref lab)    Patient have been counseled extensively about nutrition and exercise. Other issues discussed during this visit include: low cholesterol diet, weight control and daily exercise, foot care, annual eye examinations at Ophthalmology, importance of adherence with medications and regular follow-up. We also discussed long term complications of uncontrolled diabetes and hypertension.  To be determined after lab results return  The patient was given clear instructions to go to ER or return to medical center if symptoms don't improve, worsen or new problems develop. The patient verbalized understanding. The patient was told to call to get lab results if they haven't heard anything in the next week.   This note has been created with 01/07/2019. Any transcriptional errors are unintentional.   Education officer, environmental, NP 01/11/2022, 12:35 AM

## 2022-01-12 ENCOUNTER — Other Ambulatory Visit (INDEPENDENT_AMBULATORY_CARE_PROVIDER_SITE_OTHER): Payer: Medicaid Other

## 2022-01-12 ENCOUNTER — Other Ambulatory Visit (INDEPENDENT_AMBULATORY_CARE_PROVIDER_SITE_OTHER): Payer: Self-pay | Admitting: Primary Care

## 2022-01-12 DIAGNOSIS — E349 Endocrine disorder, unspecified: Secondary | ICD-10-CM

## 2022-01-13 LAB — BETA HCG QUANT (REF LAB): hCG Quant: 21 m[IU]/mL

## 2022-01-26 ENCOUNTER — Other Ambulatory Visit (INDEPENDENT_AMBULATORY_CARE_PROVIDER_SITE_OTHER): Payer: Medicaid Other

## 2022-01-26 DIAGNOSIS — E349 Endocrine disorder, unspecified: Secondary | ICD-10-CM

## 2022-01-27 LAB — BETA HCG QUANT (REF LAB): hCG Quant: 4 m[IU]/mL

## 2022-02-02 ENCOUNTER — Other Ambulatory Visit (INDEPENDENT_AMBULATORY_CARE_PROVIDER_SITE_OTHER): Payer: Medicaid Other

## 2022-02-02 DIAGNOSIS — E349 Endocrine disorder, unspecified: Secondary | ICD-10-CM

## 2022-02-03 ENCOUNTER — Telehealth (INDEPENDENT_AMBULATORY_CARE_PROVIDER_SITE_OTHER): Payer: Self-pay | Admitting: Primary Care

## 2022-02-03 LAB — BETA HCG QUANT (REF LAB): hCG Quant: 2 m[IU]/mL

## 2022-02-03 NOTE — Telephone Encounter (Signed)
Please call patient and schedule for initial depo injection. Place on michelle schedule.

## 2022-02-03 NOTE — Telephone Encounter (Signed)
Copied from CRM [id number]. Topic: Appointment Scheduling - Scheduling Inquiry for Clinic >> Feb 03, 2022  1:44 PM Michele Rich wrote: Reason for CRM: Pt called wanting to schedule in her next DEPO shot, please advise.

## 2022-02-11 ENCOUNTER — Encounter (INDEPENDENT_AMBULATORY_CARE_PROVIDER_SITE_OTHER): Payer: Self-pay | Admitting: Primary Care

## 2022-02-11 ENCOUNTER — Ambulatory Visit (INDEPENDENT_AMBULATORY_CARE_PROVIDER_SITE_OTHER): Payer: Medicaid Other | Admitting: Primary Care

## 2022-02-11 VITALS — BP 117/78 | HR 74 | Temp 98.0°F | Ht 64.0 in | Wt 229.4 lb

## 2022-02-11 DIAGNOSIS — Z30013 Encounter for initial prescription of injectable contraceptive: Secondary | ICD-10-CM | POA: Diagnosis not present

## 2022-02-11 DIAGNOSIS — Z3202 Encounter for pregnancy test, result negative: Secondary | ICD-10-CM

## 2022-02-11 LAB — POCT URINE PREGNANCY: Preg Test, Ur: NEGATIVE

## 2022-02-11 MED ORDER — MEDROXYPROGESTERONE ACETATE 150 MG/ML IM SUSY
125.0000 mg | PREFILLED_SYRINGE | Freq: Once | INTRAMUSCULAR | Status: AC
  Administered 2022-02-11: 120 mg via INTRAMUSCULAR

## 2022-02-11 NOTE — Progress Notes (Signed)
Pt presents today for initial injection of depo LMP was 6/23 Injection given today in right deltoid Next injection due Sept 14- 28

## 2022-02-11 NOTE — Progress Notes (Signed)
    [facility]  GYN CONTRACEPTION VISIT Patient name: Michele Rich MRN [id number]  Date of birth: [birth year] Chief Complaint:   Contraception (Pt would like depo)  History of Present Illness:   Michele Rich is a 36 y.o. G26P2012 African American female being seen today for contraception management.     Patient's last menstrual period was 02/05/2022 (exact date). The current method of family planning is Depo-Provera injections.  Last pap 05/06/20. Results were:  normal Review of Systems:   Pertinent items are noted in HPI Denies fever/chills, dizziness, headaches, visual disturbances, fatigue, shortness of breath, chest pain, abdominal pain, vomiting, abnormal vaginal discharge/itching/odor/irritation, problems with periods, bowel movements, urination, or intercourse unless otherwise stated above.  Pertinent History Reviewed:  Reviewed past medical,surgical, social, obstetrical and family history.  Reviewed problem list, medications and allergies. Physical Assessment:   Vitals:   02/11/22 1021  BP: 117/78  Pulse: 74  Temp: 98 F (36.7 C)  TempSrc: Oral  SpO2: 97%  Weight: 229 lb 6.4 oz (104.1 kg)  Height: 5\' 4"  (1.626 m)  Body mass index is 39.38 kg/m.       Physical Examination:   General appearance: alert, well appearing, obese and in no distress  Mental status: alert, oriented to person, place, and time  Skin: warm & dry   Cardiovascular: normal heart rate noted  Respiratory: normal respiratory effort, no distress  Abdomen: soft, non-tender   Extremities: no edema   No results found for this or any previous visit (from the past 24 hour(s)).  Assessment & Plan:  Biddie was seen today for contraception.  Diagnoses and all orders for this visit:  Encounter for initial prescription of injectable contraceptive -     POCT urine pregnancy -     medroxyPROGESTERone Acetate SUSY 120 mg     Meds:  Meds ordered this encounter  Medications    medroxyPROGESTERone Acetate SUSY 120 mg    Orders Placed This Encounter  Procedures   POCT urine pregnancy    Return for  depo injection between sept 14- sept 28.  This note has been created with [name]. Any transcriptional errors are unintentional.   Education officer, environmental, NP 02/11/2022, 10:41 AM

## 2022-04-29 ENCOUNTER — Ambulatory Visit (INDEPENDENT_AMBULATORY_CARE_PROVIDER_SITE_OTHER): Payer: Medicaid Other | Admitting: Primary Care

## 2022-05-10 ENCOUNTER — Ambulatory Visit (INDEPENDENT_AMBULATORY_CARE_PROVIDER_SITE_OTHER): Payer: Medicaid Other | Admitting: Primary Care

## 2022-05-27 ENCOUNTER — Emergency Department (HOSPITAL_BASED_OUTPATIENT_CLINIC_OR_DEPARTMENT_OTHER): Payer: Medicaid Other | Admitting: Radiology

## 2022-05-27 ENCOUNTER — Emergency Department (HOSPITAL_BASED_OUTPATIENT_CLINIC_OR_DEPARTMENT_OTHER)
Admission: EM | Admit: 2022-05-27 | Discharge: 2022-05-27 | Disposition: A | Discharge status: Home / Self Care | Payer: Medicaid Other | Attending: Emergency Medicine | Admitting: Emergency Medicine

## 2022-05-27 ENCOUNTER — Encounter (HOSPITAL_BASED_OUTPATIENT_CLINIC_OR_DEPARTMENT_OTHER): Payer: Self-pay | Admitting: Emergency Medicine

## 2022-05-27 ENCOUNTER — Other Ambulatory Visit: Payer: Self-pay

## 2022-05-27 DIAGNOSIS — M79622 Pain in left upper arm: Secondary | ICD-10-CM | POA: Diagnosis present

## 2022-05-27 DIAGNOSIS — M79602 Pain in left arm: Secondary | ICD-10-CM

## 2022-05-27 DIAGNOSIS — M25512 Pain in left shoulder: Secondary | ICD-10-CM | POA: Diagnosis not present

## 2022-05-27 MED ORDER — MELOXICAM 7.5 MG PO TABS: 7.5000 mg | ORAL_TABLET | Freq: Every day | ORAL | 0 refills | Status: DC

## 2022-05-27 NOTE — ED Provider Notes (Signed)
[facility] EMERGENCY DEPT Provider Note   CSN: [id number] Arrival date & time: 05/27/22  1304     History  Chief Complaint  Patient presents with   Shoulder Pain    Michele Rich is a 36 y.o. female.  Patient is a 36 year old female who presents with pain in her right upper arm.  Started about 3 weeks ago but seems to be getting worse.  It radiates down her arm.  No associated neck pain.  She says at times she feels some numbness in her hand that comes and goes.  She says it is worse with any movements.  She has not noted any swelling to the arm.  No recent injuries.  No prior history of issues with the arm.       Home Medications Prior to Admission medications   Medication Sig Start Date End Date Taking? Authorizing Provider  meloxicam (MOBIC) 7.5 MG tablet Take 1 tablet (7.5 mg total) by mouth daily. 05/27/22  Yes Rolan Bucco, MD  escitalopram (LEXAPRO) 10 MG tablet Take 10 mg by mouth daily. 04/30/21   [provider]  hydrOXYzine (ATARAX/VISTARIL) 25 MG tablet Take 25 mg by mouth 3 (three) times daily as needed. Patient not taking: Reported on 02/11/2022 04/30/21   [provider]  traZODone (DESYREL) 100 MG tablet Take 150 mg by mouth at bedtime. Patient not taking: Reported on 02/11/2022 01/14/22   [provider]      Allergies    Apple fruit extract and Pectin    Review of Systems   Review of Systems  Constitutional:  Negative for fever.  Gastrointestinal:  Negative for nausea and vomiting.  Musculoskeletal:  Positive for arthralgias. Negative for back pain, joint swelling and neck pain.  Skin:  Negative for wound.  Neurological:  Positive for numbness. Negative for weakness and headaches.    Physical Exam Updated Vital Signs BP 105/88   Pulse 93   Temp 98.9 F (37.2 C) (Oral)   Resp 16   Ht 5\' 4"  (1.626 m)   Wt 99.8 kg   SpO2 100%   BMI 37.76 kg/m  Physical Exam Constitutional:      Appearance: She is  well-developed.  HENT:     Head: Normocephalic and atraumatic.  Cardiovascular:     Rate and Rhythm: Normal rate.  Pulmonary:     Effort: Pulmonary effort is normal.  Musculoskeletal:        General: Tenderness present.     Cervical back: Normal range of motion and neck supple.     Comments: Positive tenderness to the proximal humerus.  There is some noted tenderness over the proximal humerus and some swelling noted.  No pain to the shoulder joint itself.  No pain to the elbow.  No pain to the cervical spine. no pain to the trapezius muscle.  She has normal sensation and motor function distally.  Radial pulses are intact.  No warmth or erythema.  Skin:    General: Skin is warm and dry.  Neurological:     Mental Status: She is alert and oriented to person, place, and time.     ED Results / Procedures / Treatments   Labs (all labs ordered are listed, but only abnormal results are displayed) Labs Reviewed - No data to display  EKG None  Radiology DG Shoulder Left  Result Date: 05/27/2022 CLINICAL DATA:  Left shoulder pain for 3 weeks.  No known injury. EXAM: LEFT SHOULDER - 2+ VIEW COMPARISON:  None  Available. FINDINGS: Normal alignment. Minimal peripheral acromioclavicular degenerative osteophytosis. The glenohumeral joint space is maintained. There is widening of the proximal humeral diaphysis. This may represent a sessile osteochondroma, with continuity of the underlying marrow and cortex. No acute fracture or dislocation. IMPRESSION: 1. Minimal acromioclavicular degenerative osteophytosis. 2. Proximal humeral widening that is compatible with a benign sessile osteochondroma. 3. No acute fracture or dislocation. Electronically Signed   By: Yvonne Kendall M.D.   On: 05/27/2022 13:51    Procedures Procedures    Medications Ordered in ED Medications - No data to display  ED Course/ Medical Decision Making/ A&P                           Medical Decision Making Amount and/or  Complexity of Data Reviewed Radiology: ordered.  Risk Prescription drug management.   Patient presents with pain in her left proximal humerus.  X-rays were obtained.  She is noted to have some widening of the proximal humerus.  This was interpreted by me and confirmed by the radiologist.  The radiologist feels that it is compatible with a benign osteochondroma.  This is the area where the patient has some visible prominence of the arm and tenderness.  No evidence of any fractures.  No clinical suggestions of infections.  No neurologic deficits.  Advised in symptomatic care.  Will prescribe Mobic.  Will refer to follow-up with orthopedics.  Return precautions were given.  Final Clinical Impression(s) / ED Diagnoses Final diagnoses:  Pain of left upper extremity    Rx / DC Orders ED Discharge Orders          Ordered    meloxicam (MOBIC) 7.5 MG tablet  Daily        05/27/22 1603              Malvin Johns, MD 05/27/22 1608

## 2022-05-27 NOTE — ED Notes (Signed)
Pt d/c home per MD order, discharge summary reviewed with pt, pt verbalizes understanding. Ambulatory off unit. No s/s of acute distress noted at discharge.  

## 2022-05-27 NOTE — Discharge Instructions (Signed)
Follow-up with the orthopedist as discussed.  You need to call make the appointment.  Do not take ibuprofen or Aleve while taking the prescribed medicine Mobic.  If you have any abdominal pain or noticeable blood in your stool or vomiting, stop taking the medication and you should be evaluated.  Return to emergency room if you have any worsening symptoms.

## 2022-05-27 NOTE — ED Triage Notes (Signed)
Pt arrives to ED with c/o shoulder pain x3 weeks ago. She reports the pain is on-going and progressively getting worse, She denies any known injury to the shoulder.

## 2022-06-11 ENCOUNTER — Telehealth (HOSPITAL_COMMUNITY): Payer: Self-pay | Admitting: Orthopedic Surgery

## 2022-06-11 MED ORDER — METHYLPREDNISOLONE 4 MG PO TBPK: ORAL_TABLET | ORAL | 0 refills | Status: DC

## 2022-06-11 NOTE — Telephone Encounter (Signed)
Rx for medrol dosepack sent to patient's pharamacy

## 2022-07-01 ENCOUNTER — Ambulatory Visit (INDEPENDENT_AMBULATORY_CARE_PROVIDER_SITE_OTHER): Payer: Medicaid Other

## 2022-07-01 ENCOUNTER — Other Ambulatory Visit (HOSPITAL_COMMUNITY)
Admission: RE | Admit: 2022-07-01 | Discharge: 2022-07-01 | Disposition: A | Discharge status: Home / Self Care | Payer: Medicaid Other | Source: Ambulatory Visit | Attending: Primary Care | Admitting: Primary Care

## 2022-07-01 DIAGNOSIS — N898 Other specified noninflammatory disorders of vagina: Secondary | ICD-10-CM | POA: Diagnosis present

## 2022-07-05 LAB — CERVICOVAGINAL ANCILLARY ONLY
Bacterial Vaginitis (gardnerella): POSITIVE — AB
Candida Glabrata: NEGATIVE
Candida Vaginitis: NEGATIVE
Chlamydia: NEGATIVE
Comment: NEGATIVE
Comment: NEGATIVE
Comment: NEGATIVE
Comment: NEGATIVE
Comment: NEGATIVE
Comment: NORMAL
Neisseria Gonorrhea: NEGATIVE
Trichomonas: POSITIVE — AB

## 2022-07-06 ENCOUNTER — Other Ambulatory Visit (INDEPENDENT_AMBULATORY_CARE_PROVIDER_SITE_OTHER): Payer: Self-pay | Admitting: Primary Care

## 2022-07-06 MED ORDER — METRONIDAZOLE 500 MG PO TABS: 500.0000 mg | ORAL_TABLET | Freq: Two times a day (BID) | ORAL | 0 refills | Status: AC

## 2022-07-06 NOTE — Telephone Encounter (Signed)
Copied from CRM [id number]. Topic: General - Inquiry >> Jul 06, 2022 12:26 PM De Blanch wrote: Reason for CRM: Pt is requesting a call back to discuss recent labs.

## 2022-07-13 ENCOUNTER — Other Ambulatory Visit: Payer: Self-pay

## 2022-07-13 ENCOUNTER — Emergency Department (HOSPITAL_BASED_OUTPATIENT_CLINIC_OR_DEPARTMENT_OTHER)
Admission: EM | Admit: 2022-07-13 | Discharge: 2022-07-13 | Disposition: A | Discharge status: Home / Self Care | Payer: Medicaid Other | Attending: Emergency Medicine | Admitting: Emergency Medicine

## 2022-07-13 ENCOUNTER — Encounter (HOSPITAL_BASED_OUTPATIENT_CLINIC_OR_DEPARTMENT_OTHER): Payer: Self-pay | Admitting: Emergency Medicine

## 2022-07-13 DIAGNOSIS — M542 Cervicalgia: Secondary | ICD-10-CM | POA: Insufficient documentation

## 2022-07-13 DIAGNOSIS — M79602 Pain in left arm: Secondary | ICD-10-CM | POA: Diagnosis present

## 2022-07-13 MED ORDER — METHOCARBAMOL 1000 MG PO TABS: 1000.0000 mg | ORAL_TABLET | Freq: Two times a day (BID) | ORAL | 0 refills | Status: DC | PRN

## 2022-07-13 MED ORDER — GABAPENTIN 100 MG PO CAPS: 100.0000 mg | ORAL_CAPSULE | Freq: Three times a day (TID) | ORAL | 0 refills | Status: DC

## 2022-07-13 NOTE — Discharge Instructions (Addendum)
You were seen today for neck and arm pain. We did not identify any emergent cause for your symptoms. Your evaluation is most consistent with continued cervical radiculopathy.  We discussed taking further x-rays, however given progressive nature of symptoms, this is likely to be related to your radiculopathy and unlikely to be an acute new pathology.  Plan and next steps:   The following may be helpful in managing your symptoms:   Pain- Lidocaine Patches  Apply to affected area for up to 12 hours at a time.   Pain/Fever- Adult Tylenol dosing:  650 mg orally every 4 to 6 hours as needed, MAX: 3250 mg/24 hours   (Extra-strength) 1000 mg orally every 6 hours as needed; MAX: 3000 mg/24 hours   Do not use if you have liver disease. Read the label on the bottle.   Pain/Fever- Adult Ibuprofen Dosing  200 to 400 mg orally every 4 to 6 hours as needed; MAX 1200 mg/day; do not take longer than 10 days   Do not use if you have kidney disease. Read the label on the bottle   Findings:  You may see all of your lab and imaging results utilizing our online portal! Look in this document or ask a team member for your mychart* access information. The most notable results have additionally been verbally communicated with you and your bedside family.    Follow-up Plan:   Follow up with the patient's normal primary care provider for monitoring of this condition within 48 hours.  Please call your orthopedic today to arrange follow-up appointment given your progressive symptoms.  Signs/Symptoms that would warrant return to the ED:  Please return to the ED if you experience worsening of symptoms or any abrupt changes in your health. Standard of care precautions for your chief complaint have already been verbally communicated with you. Always be on alert for fevers, chills, shortness of breath, chest pains, or sudden changes that warrant immediate evaluation.    Thank you for allowing [place] to be a part of you and your  families' care.   Glyn Ade MD

## 2022-07-13 NOTE — ED Notes (Signed)
Patient verbalizes understanding of discharge instructions. Opportunity for questioning and answers were provided. Patient discharged from ED.  °

## 2022-07-13 NOTE — ED Provider Notes (Signed)
[facility] EMERGENCY DEPT Provider Note   CSN: [id number] Arrival date & time: 07/13/22  7829     History Chief Complaint  Patient presents with   Arm Pain    HPI Michele Rich is a 36 y.o. female presenting for continuation of left arm pain.  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  She followed up with orthopedic surgery who feels that her symptoms are less related to the tumor and more related to cervical radiculopathy.  The recommended she go to PT which she has been participating in.  Her symptoms have only been progressive.  She could not sleep well last night because of the pain which brings her in this morning. On chart review, orthopedic surgery planning for MRI if patient symptoms were persistent. Patient was unaware of this plan. Patient denies fevers or chills, nausea vomiting, syncope shortness of breath or chest no traumatic injury.  She endorses gradual progression of the pain, no acute worsening.   Patient's recorded medical, surgical, social, medication list and allergies were reviewed in the Snapshot window as part of the initial history.   Review of Systems   Review of Systems  Constitutional:  Negative for chills and fever.  HENT:  Negative for ear pain and sore throat.   Eyes:  Negative for pain and visual disturbance.  Respiratory:  Negative for cough and shortness of breath.   Cardiovascular:  Negative for chest pain and palpitations.  Gastrointestinal:  Negative for abdominal pain and vomiting.  Genitourinary:  Negative for dysuria and hematuria.  Musculoskeletal:  Negative for arthralgias and back pain.  Skin:  Negative for color change and rash.  Neurological:  Negative for seizures and syncope.  All other systems reviewed and are negative.   Physical Exam Updated Vital Signs BP 130/85 (BP Location: Right Arm)   Pulse 86   Temp 97.8 F (36.6 C) (Temporal)   Resp 15   Ht 5'  4" (1.626 m)   Wt 99.8 kg   SpO2 99%   BMI 37.76 kg/m  Physical Exam Vitals and nursing note reviewed.  Constitutional:      General: She is not in acute distress.    Appearance: She is well-developed.  HENT:     Head: Normocephalic and atraumatic.  Eyes:     Conjunctiva/sclera: Conjunctivae normal.  Cardiovascular:     Rate and Rhythm: Normal rate and regular rhythm.     Heart sounds: No murmur heard. Pulmonary:     Effort: Pulmonary effort is normal. No respiratory distress.     Breath sounds: Normal breath sounds.  Abdominal:     General: There is no distension.     Palpations: Abdomen is soft.     Tenderness: There is no abdominal tenderness. There is no right CVA tenderness or left CVA tenderness.  Musculoskeletal:        General: No swelling or tenderness. Normal range of motion.     Cervical back: Neck supple.  Skin:    General: Skin is warm and dry.  Neurological:     General: No focal deficit present.     Mental Status: She is alert and oriented to person, place, and time. Mental status is at baseline.     Cranial Nerves: No cranial nerve deficit.      ED Course/ Medical Decision Making/ A&P    Procedures Procedures   Medications Ordered in ED Medications - No data to display  Medical Decision Making:    Michele Rich is a 36 y.o. female who presented to the ED today with musculoskeletal pain focused around her left neck radiating down her left arm detailed above.     Patient's presentation is complicated by their history of elevated BMI.  Patient placed on continuous vitals and telemetry monitoring while in ED which was reviewed periodically.   Complete initial physical exam performed, notably the patient  was hemodynamically stable in no acute distress.  Pain radiates from front left neck all the way down to her left arm.  She has full range of motion of the arm.  Neurovascularly intact.  No focal palpation over the swelling localized to her left  proximal upper arm      Reviewed and confirmed nursing documentation for past medical history, family history, social history.    Initial Assessment:   With the patient's presentation of arm and neck pain, most likely diagnosis is progression of her cervical radiculopathy. Other diagnoses were considered including (but not limited to) fracture through the tumor, shoulder dislocation. These are considered less likely due to history of present illness and physical exam findings.   This is most consistent with an acute life/limb threatening illness complicated by underlying chronic conditions.  Initial Plan:  Patient's main reason for presentation today is symptomatic management.  It has been gradually progressive and she does not know what medications to take.  She has been taking Tylenol and ibuprofen with only moderate symptomatic improvement.  States that she needs a work note for work today severity of her symptoms medications for management. Discussed supportive care, she is appropriately using anti-inflammatories without complete symptomatic management.  Will add on muscle relaxing medications and recommend that she call her orthopedic today to inform them of progression of her symptoms and arrange a follow-up appointment within 48 hours. Additionally, I will add on a gabapentin taper to trial for symptomatic management.  Disposition:  I have considered need for hospitalization, however, considering all of the above, I believe this patient is stable for discharge at this time.  Patient/family educated about specific return precautions for given chief complaint and symptoms.  Patient/family educated about follow-up with PCP and orthopedics.     Patient/family expressed understanding of return precautions and need for follow-up. Patient spoken to regarding all imaging and laboratory results and appropriate follow up for these results. All education provided in verbal form with additional  information in written form. Time was allowed for answering of patient questions. Patient discharged.    Emergency Department Medication Summary:   Medications - No data to display      Clinical Impression:  1. Neck pain on left side      Discharge   Final Clinical Impression(s) / ED Diagnoses Final diagnoses:  Neck pain on left side    Rx / DC Orders ED Discharge Orders          Ordered    methocarbamol 1000 MG TABS  2 times daily PRN        07/13/22 0743    gabapentin (NEURONTIN) 100 MG capsule  3 times daily        07/13/22 0743              Glyn Ade, MD 07/13/22 0745

## 2022-07-13 NOTE — ED Triage Notes (Signed)
Pt arrives to ED with c/o left arm pain for months. Hx osteochondroma of left humerus and cervical radiculopathy.

## 2022-07-25 ENCOUNTER — Encounter (HOSPITAL_BASED_OUTPATIENT_CLINIC_OR_DEPARTMENT_OTHER): Payer: Self-pay | Admitting: Emergency Medicine

## 2022-07-25 ENCOUNTER — Emergency Department (HOSPITAL_BASED_OUTPATIENT_CLINIC_OR_DEPARTMENT_OTHER): Payer: Medicaid Other

## 2022-07-25 ENCOUNTER — Emergency Department (HOSPITAL_BASED_OUTPATIENT_CLINIC_OR_DEPARTMENT_OTHER)
Admission: EM | Admit: 2022-07-25 | Discharge: 2022-07-25 | Disposition: A | Discharge status: Home / Self Care | Payer: Medicaid Other | Attending: Emergency Medicine | Admitting: Emergency Medicine

## 2022-07-25 DIAGNOSIS — M79602 Pain in left arm: Secondary | ICD-10-CM | POA: Diagnosis present

## 2022-07-25 MED ORDER — OXYCODONE-ACETAMINOPHEN 5-325 MG PO TABS
1.0000 | ORAL_TABLET | Freq: Once | ORAL | Status: AC
  Administered 2022-07-25: 1 via ORAL
  Filled 2022-07-25: qty 1

## 2022-07-25 MED ORDER — PREDNISONE 10 MG (21) PO TBPK: ORAL_TABLET | Freq: Every day | ORAL | 0 refills | Status: DC

## 2022-07-25 MED ORDER — KETOROLAC TROMETHAMINE 30 MG/ML IJ SOLN
30.0000 mg | Freq: Once | INTRAMUSCULAR | Status: AC
  Administered 2022-07-25: 30 mg via INTRAMUSCULAR

## 2022-07-25 MED ORDER — CYCLOBENZAPRINE HCL 10 MG PO TABS: 10.0000 mg | ORAL_TABLET | Freq: Two times a day (BID) | ORAL | 0 refills | Status: DC | PRN

## 2022-07-25 MED ORDER — LIDOCAINE 5 % EX PTCH: 1.0000 | MEDICATED_PATCH | CUTANEOUS | 0 refills | Status: DC

## 2022-07-25 MED ORDER — OXYCODONE-ACETAMINOPHEN 5-325 MG PO TABS: 1.0000 | ORAL_TABLET | Freq: Four times a day (QID) | ORAL | 0 refills | Status: DC | PRN

## 2022-07-25 NOTE — ED Provider Notes (Signed)
[facility] EMERGENCY DEPT Provider Note   CSN: [id number] Arrival date & time: 07/25/22  1109    History  Chief Complaint  Patient presents with   Arm Pain    JONELLE BANN is a 36 y.o. female here for evaluation of left arm pain over the last 3 months.  Has been seen multiple times for this with local orthopedics, emergency department as well as outpatient at [facility].  Had x-ray which showed osteochondroma on prior ED visit.  She was referred to Ortho oncology at [facility] who thought her symptoms were likely due to cervical radiculopathy. Set up for PT which she had been doing and she supposed to go for MRI cervical region however she has not set this up.  Denies any redness, warmth to arm.  Has tingling to her arm which has been chronic over the last 3 months.  No headache, blurred vision, slurred speech, CP, SOB, back pain, no history of MS.  No chest pain, shortness of breath, surgical procedures to area. Took muscle relaxer and gabapentin on last ED visit without relief. Sx not improving over time. Pain to arm is throughout from shoulder to hand. Denies neck pain, worsening sx with movement to cervical region.  HPI     Home Medications Prior to Admission medications   Medication Sig Start Date End Date Taking? Authorizing Provider  cyclobenzaprine (FLEXERIL) 10 MG tablet Take 1 tablet (10 mg total) by mouth 2 (two) times daily as needed for muscle spasms. 07/25/22  Yes Nanetta Wiegman A, PA-C  lidocaine (LIDODERM) 5 % Place 1 patch onto the skin daily. Remove & Discard patch within 12 hours or as directed by MD 07/25/22  Yes Lenni Reckner A, PA-C  oxyCODONE-acetaminophen (PERCOCET/ROXICET) 5-325 MG tablet Take 1 tablet by mouth every 6 (six) hours as needed for severe pain. 07/25/22  Yes Jania Steinke A, PA-C  predniSONE (STERAPRED UNI-PAK 21 TAB) 10 MG (21) TBPK tablet Take by mouth daily. Take 6 tabs by mouth daily  for 1 days, then 5 tabs for 1 days,  then 4 tabs for 1 days, then 3 tabs for 1 days, 2 tabs for 1 days, then 1 tab by mouth daily for 1 days 07/25/22  Yes Cass Vandermeulen A, PA-C  escitalopram (LEXAPRO) 10 MG tablet Take 10 mg by mouth daily. 04/30/21   [provider]  gabapentin (NEURONTIN) 100 MG capsule Take 1 capsule (100 mg total) by mouth 3 (three) times daily for 20 days. 07/13/22 08/02/22  Glyn Ade, MD  methylPREDNISolone (MEDROL DOSEPAK) 4 MG TBPK tablet Take dose pack taper per package instructions 06/11/22   Joen Laura, MD  metroNIDAZOLE (FLAGYL) 500 MG tablet Take 1 tablet (500 mg total) by mouth 2 (two) times daily. 07/06/22   Grayce Sessions, NP      Allergies    Apple fruit extract and Pectin    Review of Systems   Review of Systems  Constitutional: Negative.   HENT: Negative.    Respiratory: Negative.    Cardiovascular: Negative.   Gastrointestinal: Negative.   Genitourinary: Negative.   Musculoskeletal:        Left arm pain  Skin: Negative.   Neurological: Negative.   All other systems reviewed and are negative.  Physical Exam Updated Vital Signs BP (!) 163/107 (BP Location: Right Arm)   Pulse 82   Temp 98.5 F (36.9 C) (Oral)   Resp 18   SpO2 100%  Physical Exam Vitals and nursing note reviewed.  Constitutional:      General: She is not in acute distress.    Appearance: She is well-developed. She is not ill-appearing, toxic-appearing or diaphoretic.  HENT:     Head: Normocephalic and atraumatic.     Nose: Nose normal.     Mouth/Throat:     Mouth: Mucous membranes are moist.  Eyes:     Pupils: Pupils are equal, round, and reactive to light.  Neck:     Trachea: Trachea and phonation normal.     Comments: No midline cervical tenderness, Full ROM without difficulty Cardiovascular:     Rate and Rhythm: Normal rate.     Pulses: Normal pulses.          Radial pulses are 2+ on the right side and 2+ on the left side.     Heart sounds: Normal heart sounds.      Comments: Nontender Pulmonary:     Effort: Pulmonary effort is normal. No respiratory distress.     Breath sounds: Normal breath sounds.  Abdominal:     General: Bowel sounds are normal. There is no distension.     Palpations: Abdomen is soft.  Musculoskeletal:        General: Normal range of motion.     Cervical back: Full passive range of motion without pain and normal range of motion.     Comments: Diffuse tenderness to left arm.  Compartment soft.  Full range of motion without difficulty  Skin:    General: Skin is warm and dry.  Neurological:     General: No focal deficit present.     Mental Status: She is alert.  Psychiatric:        Mood and Affect: Mood normal.    ED Results / Procedures / Treatments   Labs (all labs ordered are listed, but only abnormal results are displayed) Labs Reviewed - No data to display  EKG None  Radiology US Venous Img Upper Uni Left  Result Date: 07/25/2022 CLINICAL DATA:  Left upper extremity pain. EXAM: LEFT UPPER EXTREMITY VENOUS DOPPLER ULTRASOUND TECHNIQUE: Gray-scale sonography with graded compression, as well as color Doppler and duplex ultrasound were performed to evaluate the upper extremity deep venous system from the level of the subclavian vein and including the jugular, axillary, basilic, radial, ulnar and upper cephalic vein. Spectral Doppler was utilized to evaluate flow at rest and with distal augmentation maneuvers. COMPARISON:  None Available. FINDINGS: Contralateral Subclavian Vein: Respiratory phasicity is normal and symmetric with the symptomatic side. No evidence of thrombus. Normal compressibility. Internal Jugular Vein: No evidence of thrombus. Normal compressibility, respiratory phasicity and response to augmentation. Subclavian Vein: No evidence of thrombus. Normal compressibility, respiratory phasicity and response to augmentation. Axillary Vein: No evidence of thrombus. Normal compressibility, respiratory phasicity and  response to augmentation. Cephalic Vein: No evidence of thrombus. Normal compressibility, respiratory phasicity and response to augmentation. Basilic Vein: No evidence of thrombus. Normal compressibility, respiratory phasicity and response to augmentation. Brachial Veins: No evidence of thrombus. Normal compressibility, respiratory phasicity and response to augmentation. Radial Veins: No evidence of thrombus. Normal compressibility, respiratory phasicity and response to augmentation. Ulnar Veins: No evidence of thrombus. Normal compressibility, respiratory phasicity and response to augmentation. Venous Reflux:  None visualized. Other Findings:  None visualized. IMPRESSION: No evidence of DVT within the left upper extremity. Electronically Signed   By: Amie Portland M.D.   On: 07/25/2022 15:21    Procedures Procedures    Medications Ordered in ED Medications  ketorolac (TORADOL) 30  MG/ML injection 30 mg (30 mg Intramuscular Given 07/25/22 1407)  oxyCODONE-acetaminophen (PERCOCET/ROXICET) 5-325 MG per tablet 1 tablet (1 tablet Oral Given 07/25/22 1407)   ED Course/ Medical Decision Making/ A&P    36 year old here for evaluation of continued left arm pain which has been going on over the last 3 to 4 months.  She has been evaluated multiple times for this in the emergency department as well as outpatient setting.  She has had steroid injections as well as x-ray and MRI of her arm according to patient.  He subsequently is diagnosed with osteochondroma of her humerus and was [facility] however upon their evaluation they thought her symptoms were likely due to cervical radiculopathy.  She was sent for PT which she has completed however is supposed to be set up for an MRI of her cervical region.  Denies any recent traumatic injuries.  No redness, warmth.  Does have some occasional tingling to the arm.  No headache, chest pain, blurred vision.  No rashes or lesions.  No history of MS,  dissection or early age.  Patient is frustrated as this has been a long standing issue which has not improved.  Does have some subjective decrease sensation over the left C6/C7 dermatome on the left upper extremity.  No midline spinal tenderness.  Will plan on managing her pain here.  Will get ultrasound to rule out DVT however I have low suspicion for this at this time.  Unfortunately do not have MRI available at this time.  She will likely need to follow back up with her outpatient provider that she is already following for this for continued workup.  She does not have any symptoms to suggest MS, PE, dissection, ACS, unstable angina, ischemia, septic joint, bursitis, compartment syndrome, rhabdomyolysis, myositis, bacterial infectious process, mass.  Imaging personally viewed interpreted:  Ultrasound without evidence of VTE  Patient reassessed.  We discussed her imaging.  DC home with somatic management.  Encourage close follow-up outpatient with her providers in the outpatient setting for further imaging.  Do not feel she needs emergent transfer to OSH for MRI at this time given her symptoms have been ongoing over the last 3 months.  The patient has been appropriately medically screened and/or stabilized in the ED. I have low suspicion for any other emergent medical condition which would require further screening, evaluation or treatment in the ED or require inpatient management.  Patient is hemodynamically stable and in no acute distress.  Patient able to ambulate in department prior to ED.  Evaluation does not show acute pathology that would require ongoing or additional emergent interventions while in the emergency department or further inpatient treatment.  I have discussed the diagnosis with the patient and answered all questions.  Pain is been managed while in the emergency department and patient has no further complaints prior to discharge.  Patient is comfortable with plan discussed in room  and is stable for discharge at this time.  I have discussed strict return precautions for returning to the emergency department.  Patient was encouraged to follow-up with PCP/specialist refer to at discharge.                            Medical Decision Making Amount and/or Complexity of Data Reviewed Independent Historian: friend External Data Reviewed: radiology and notes. Radiology: ordered and independent interpretation performed. Decision-making details documented in ED Course.  Risk OTC drugs. Prescription drug management.  Parenteral controlled substances. Decision regarding hospitalization. Diagnosis or treatment significantly limited by social determinants of health.         Final Clinical Impression(s) / ED Diagnoses Final diagnoses:  Left arm pain    Rx / DC Orders ED Discharge Orders          Ordered    predniSONE (STERAPRED UNI-PAK 21 TAB) 10 MG (21) TBPK tablet  Daily        07/25/22 1609    oxyCODONE-acetaminophen (PERCOCET/ROXICET) 5-325 MG tablet  Every 6 hours PRN        07/25/22 1609    lidocaine (LIDODERM) 5 %  Every 24 hours        07/25/22 1609    cyclobenzaprine (FLEXERIL) 10 MG tablet  2 times daily PRN        07/25/22 1609              Glenola Wheat A, PA-C 07/25/22 2136    Glynn Octave, MD 07/26/22 1035

## 2022-07-25 NOTE — Discharge Instructions (Signed)
Take medications as prescribed.  Do not drive or operate heavy machinery while taking the Percocet  Make sure to follow-up with [facility] to schedule the MRI

## 2022-07-25 NOTE — ED Triage Notes (Signed)
Pt presents to ED Pov. Pt c/o L arm pain. Pt reports otc med w/o relief. Pt seen for same reports it's not gotten any better

## 2022-08-01 ENCOUNTER — Encounter (HOSPITAL_BASED_OUTPATIENT_CLINIC_OR_DEPARTMENT_OTHER): Payer: Self-pay | Admitting: Emergency Medicine

## 2022-08-01 ENCOUNTER — Other Ambulatory Visit: Payer: Self-pay

## 2022-08-01 ENCOUNTER — Emergency Department (HOSPITAL_BASED_OUTPATIENT_CLINIC_OR_DEPARTMENT_OTHER): Payer: Medicaid Other | Admitting: Radiology

## 2022-08-01 ENCOUNTER — Emergency Department (HOSPITAL_BASED_OUTPATIENT_CLINIC_OR_DEPARTMENT_OTHER)
Admission: EM | Admit: 2022-08-01 | Discharge: 2022-08-01 | Disposition: A | Discharge status: Home / Self Care | Payer: Medicaid Other | Attending: Emergency Medicine | Admitting: Emergency Medicine

## 2022-08-01 DIAGNOSIS — M79602 Pain in left arm: Secondary | ICD-10-CM | POA: Diagnosis present

## 2022-08-01 MED ORDER — OXYCODONE-ACETAMINOPHEN 5-325 MG PO TABS: 1.0000 | ORAL_TABLET | Freq: Four times a day (QID) | ORAL | 0 refills | Status: AC | PRN

## 2022-08-01 MED ORDER — OXYCODONE-ACETAMINOPHEN 5-325 MG PO TABS
1.0000 | ORAL_TABLET | Freq: Once | ORAL | Status: AC
  Administered 2022-08-01: 1 via ORAL
  Filled 2022-08-01: qty 1

## 2022-08-01 MED ORDER — GABAPENTIN 100 MG PO CAPS: 100.0000 mg | ORAL_CAPSULE | Freq: Three times a day (TID) | ORAL | 0 refills | Status: AC

## 2022-08-01 NOTE — ED Triage Notes (Signed)
Left arm pain for couple of months. Says she has been dx as tumor in that arm. Has been to many MD's ,referred to [facility],but noone has addressed her pain, wanting pain meds.

## 2022-08-01 NOTE — ED Triage Notes (Signed)
Ortho ordered tramadol this week but has not helped.

## 2022-08-01 NOTE — ED Provider Notes (Signed)
[facility]   CSN: [id number] Arrival date & time: 08/01/22  [id number]     History  Chief Complaint  Patient presents with   Arm Pain    Michele Rich is a 36 y.o. female.  With past medical history of alcohol use disorder, known osteochondroma of the left upper arm who presents to the emergency department with arm pain.  Patient states that she is continue to have left arm pain.  States that she believes her known tumor is causing the pain but was told that this should not be causing symptoms.  She states that she intermittently has numbness or tingling in the left arm.  She states that she used to be able to lay on the arm but now feels like there is "a knot or something in there."  She states that she has been taking multiple medications including Tylenol, ibuprofen, tramadol, muscle relaxants without relief of symptoms.  She was recently prescribed oxycodone which she states did provide some relief.  She states that she was initially seen in the middle of October for having pain in her left upper arm.  She had an x-ray done which showed that she had a benign osteochondroma.  She was referred to orthopedics and prescribed Mobic.  She was then seen on 06/29/2022 with Dr. Margaretha Glassingonway, orthopedics.  Pain in the right arm was thought to be likely from cervical radiculopathy.  She was referred to PT and scheduled to have MRI which was not set up. She was then seen on 07/13/2022 for arm and neck pain on the left side.  They prescribed her Robaxin and gabapentin as her pain was uncontrolled.  She was then seen on 07/25/2022 for left arm pain again.  They performed DVT study which was negative.  They prescribed her a Sterapred pack, Percocet, lidocaine and Flexeril.      Arm Pain       Home Medications Prior to Admission medications   Medication Sig Start Date End Date Taking? Authorizing Provider  cyclobenzaprine (FLEXERIL) 10 MG tablet Take 1 tablet  (10 mg total) by mouth 2 (two) times daily as needed for muscle spasms. 07/25/22   Henderly, Britni A, PA-C  escitalopram (LEXAPRO) 10 MG tablet Take 10 mg by mouth daily. 04/30/21   [provider]  gabapentin (NEURONTIN) 100 MG capsule Take 1 capsule (100 mg total) by mouth 3 (three) times daily for 20 days. 08/01/22 08/21/22  Cristopher PeruAutry, Clovis Warwick E, PA-C  lidocaine (LIDODERM) 5 % Place 1 patch onto the skin daily. Remove & Discard patch within 12 hours or as directed by MD 07/25/22   Henderly, Britni A, PA-C  methylPREDNISolone (MEDROL DOSEPAK) 4 MG TBPK tablet Take dose pack taper per package instructions 06/11/22   Joen LauraMarchwiany, Daniel A, MD  metroNIDAZOLE (FLAGYL) 500 MG tablet Take 1 tablet (500 mg total) by mouth 2 (two) times daily. 07/06/22   Grayce SessionsEdwards, Michelle P, NP  oxyCODONE-acetaminophen (PERCOCET/ROXICET) 5-325 MG tablet Take 1 tablet by mouth every 6 (six) hours as needed for up to 5 days for severe pain. 08/01/22 08/06/22  Cristopher PeruAutry, Trishna Cwik E, PA-C  predniSONE (STERAPRED UNI-PAK 21 TAB) 10 MG (21) TBPK tablet Take by mouth daily. Take 6 tabs by mouth daily  for 1 days, then 5 tabs for 1 days, then 4 tabs for 1 days, then 3 tabs for 1 days, 2 tabs for 1 days, then 1 tab by mouth daily for 1 days 07/25/22   Henderly, Britni A, PA-C  Allergies    Apple fruit extract and Pectin    Review of Systems   Review of Systems  Musculoskeletal:  Positive for arthralgias and myalgias.  All other systems reviewed and are negative.   Physical Exam Updated Vital Signs BP 130/89   Pulse (!) 102   Temp 97.8 F (36.6 C)   Resp 18   SpO2 99%  Physical Exam Vitals and nursing note reviewed.  Constitutional:      General: She is not in acute distress.    Appearance: Normal appearance. She is not ill-appearing or toxic-appearing.  HENT:     Head: Normocephalic and atraumatic.  Eyes:     General: No scleral icterus.    Extraocular Movements: Extraocular movements intact.  Cardiovascular:      Pulses: Normal pulses.  Pulmonary:     Effort: Pulmonary effort is normal. No respiratory distress.  Musculoskeletal:        General: Tenderness present. No swelling or signs of injury. Normal range of motion.     Cervical back: Normal range of motion and neck supple. No tenderness.     Comments: Tenderness to palpation of the left upper arm.  There is no obvious swelling, deformity.  Compartments are soft.  Radial pulses 2+.  Sensation is intact.  She is able to flex and extend the shoulder as well as the elbow.  Skin:    General: Skin is warm and dry.     Capillary Refill: Capillary refill takes less than 2 seconds.     Findings: No bruising, erythema or rash.  Neurological:     General: No focal deficit present.     Mental Status: She is alert and oriented to person, place, and time. Mental status is at baseline.  Psychiatric:        Mood and Affect: Mood normal.        Behavior: Behavior normal.        Thought Content: Thought content normal.        Judgment: Judgment normal.     ED Results / Procedures / Treatments   Labs (all labs ordered are listed, but only abnormal results are displayed) Labs Reviewed - No data to display  EKG None  Radiology DG Humerus Right  Result Date: 08/01/2022 CLINICAL DATA:  Known osteo chondroma. Rule out new fracture. Left arm pain. EXAM: RIGHT HUMERUS - 2+ VIEW COMPARISON:  Left humerus radiographs 06/10/2022. MRI of the left humerus 06/11/2022 FINDINGS: The known osteochondroma is stable. No acute fracture is present. Shoulder and elbow joints are located. IMPRESSION: 1. No acute fracture or dislocation. 2. Stable osteochondroma. Electronically Signed   By: Marin Roberts M.D.   On: 08/01/2022 13:26    Procedures Procedures   Medications Ordered in ED Medications  oxyCODONE-acetaminophen (PERCOCET/ROXICET) 5-325 MG per tablet 1 tablet (1 tablet Oral Given 08/01/22 1319)    ED Course/ Medical Decision Making/ A&P                            Medical Decision Making Amount and/or Complexity of Data Reviewed Radiology: ordered.  Risk Prescription drug management.  Initial Impression and Ddx 36 year old female who presents to the emergency department with left upper arm pain.  This is in the setting of known benign osteochondroma.  She is having ongoing pain that she feels is not improving.  States that she had some improvement after she was seen the last time here in the emergency department.  It is also thought that part of her pain symptom is concerning for cervical radiculopathy which she still needs MRI for.  She has tenderness to palpation over the left upper arm with normal range of motion.  will obtain a new plain film to evaluate for fracture and provide with Percocet here. Patient PMH that increases complexity of ED encounter: Benign osteochondroma, alcoholism  Interpretation of Diagnostics I independent reviewed and interpreted the labs as followed: Not indicated  - I independently visualized the following imaging with scope of interpretation limited to determining acute life threatening conditions related to emergency care: Plain film of the left humerus which shows that she has no fracture or dislocation, she has a stable osteochondroma  Patient Reassessment and Ultimate Disposition/Management On reassessment she is having some relief from the Percocet. The arm is soft without evidence of compartment syndrome.  There is no obvious deformities. The joints are without erythema, warmth and no reported fever concerning for septic joints. She does have some radiculopathy symptoms which is known and not new.  She is scheduled to have MRI at some point.  Unfortunately MRI is not available here at [facility] emergency department today so we will not obtain.  She has no new head or neck trauma to be concern for an acute pathology. No new injury to the left upper arm.  I did obtain repeated films given that she has known  mass to evaluate for fracture and this was negative. She is neurovascularly intact. Do not feel like her symptoms are from other etiologies such as ACS, dissection.  She had recent DVT study for similar symptoms which was negative.  Feel that she is appropriate for follow-up with her orthopedic.  I we will represcribe her gabapentin and Percocet for home.  She is given precautions regarding these medications.  Given return precautions for weakness.  She is to have follow-up as well with [facility] which is scheduled already.  I otherwise feel that she is safe for discharge at this time. The patient has been appropriately medically screened and/or stabilized in the ED. I have low suspicion for any other emergent medical condition which would require further screening, evaluation or treatment in the ED or require inpatient management. At time of discharge the patient is hemodynamically stable and in no acute distress. I have discussed work-up results and diagnosis with patient and answered all questions. Patient is agreeable with discharge plan. We discussed strict return precautions for returning to the emergency department and they verbalized understanding.     Patient management required discussion with the following services or consulting groups:  None  Complexity of Problems Addressed Chronic illness with exacerbation  Additional Data Reviewed and Analyzed Further history obtained from: Past medical history and medications listed in the EMR, Prior ED visit notes, Recent Consult notes, Care Everywhere, and Prior labs/imaging results  Patient Encounter Risk Assessment Prescriptions and Use of parenteral controlled substances  Final Clinical Impression(s) / ED Diagnoses Final diagnoses:  Left arm pain    Rx / DC Orders ED Discharge Orders          Ordered    gabapentin (NEURONTIN) 100 MG capsule  3 times daily        08/01/22 1409    oxyCODONE-acetaminophen (PERCOCET/ROXICET) 5-325 MG tablet   Every 6 hours PRN        08/01/22 1409              Cristopher Peru, PA-C 08/01/22 1429    Rolan Bucco,  MD 08/01/22 1540

## 2022-08-01 NOTE — Discharge Instructions (Signed)
You were seen in the emergency department for left arm pain.  We took a repeated x-ray which does not show any fracture of your left upper arm where there is a known benign osteochondroma.  I am represcribing your Percocet as well as gabapentin.  Please follow-up with the orthopedic, Dr. Margaretha Glassing for further instruction on workup.  Please return if you have weakness in your arm.

## 2022-08-01 NOTE — ED Notes (Signed)
RT note: Pt. walked back to room 8, v/s updated, given blanket and notified of call bell if needed.

## 2022-12-22 ENCOUNTER — Encounter (HOSPITAL_BASED_OUTPATIENT_CLINIC_OR_DEPARTMENT_OTHER): Payer: Self-pay | Admitting: Emergency Medicine

## 2022-12-22 ENCOUNTER — Other Ambulatory Visit: Payer: Self-pay

## 2022-12-22 ENCOUNTER — Emergency Department (HOSPITAL_BASED_OUTPATIENT_CLINIC_OR_DEPARTMENT_OTHER)
Admission: EM | Admit: 2022-12-22 | Discharge: 2022-12-22 | Disposition: A | Discharge status: Home / Self Care | Payer: Medicaid Other | Attending: Emergency Medicine | Admitting: Emergency Medicine

## 2022-12-22 DIAGNOSIS — M5412 Radiculopathy, cervical region: Secondary | ICD-10-CM | POA: Diagnosis not present

## 2022-12-22 DIAGNOSIS — M79602 Pain in left arm: Secondary | ICD-10-CM | POA: Insufficient documentation

## 2022-12-22 DIAGNOSIS — D169 Benign neoplasm of bone and articular cartilage, unspecified: Secondary | ICD-10-CM | POA: Insufficient documentation

## 2022-12-22 MED ORDER — METHYLPREDNISOLONE 4 MG PO TBPK: ORAL_TABLET | ORAL | 0 refills | Status: AC

## 2022-12-22 MED ORDER — CYCLOBENZAPRINE HCL 10 MG PO TABS: 10.0000 mg | ORAL_TABLET | Freq: Two times a day (BID) | ORAL | 0 refills | Status: AC | PRN

## 2022-12-22 MED ORDER — OXYCODONE HCL 5 MG PO TABS: 5.0000 mg | ORAL_TABLET | Freq: Four times a day (QID) | ORAL | 0 refills | Status: AC | PRN

## 2022-12-22 MED ORDER — LIDOCAINE 5 % EX PTCH: 1.0000 | MEDICATED_PATCH | CUTANEOUS | 0 refills | Status: AC

## 2022-12-22 NOTE — ED Notes (Signed)
Reviewed AVS/discharge instruction with patient. Time allotted for and all questions answered. Patient is agreeable for d/c and escorted to ed exit by staff.  

## 2022-12-22 NOTE — ED Triage Notes (Signed)
Pt arrives pov, steady gait with c/o LT arm pain x 2 days with decreased ROM, endorses tumor in arm. Pt denies CP or jaw pain. Took tylenol 1 g ~ 2 hrs pta

## 2022-12-22 NOTE — ED Provider Notes (Signed)
  [place] EMERGENCY DEPARTMENT AT [facility] Provider Note   CSN: [id number] Arrival date & time: 12/22/22  1429     History {Add pertinent medical, surgical, social history, OB history to HPI:1} Chief Complaint  Patient presents with   Arm Pain    Michele Rich is a 37 y.o. female.  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   Home Medications Prior to Admission medications   Medication Sig Start Date End Date Taking? Authorizing Provider  cyclobenzaprine (FLEXERIL) 10 MG tablet Take 1 tablet (10 mg total) by mouth 2 (two) times daily as needed for muscle spasms. 07/25/22   Henderly, Britni A, PA-C  escitalopram (LEXAPRO) 10 MG tablet Take 10 mg by mouth daily. 04/30/21   [provider]  gabapentin (NEURONTIN) 100 MG capsule Take 1 capsule (100 mg total) by mouth 3 (three) times daily for 20 days. 08/01/22 08/21/22  Cristopher Peru, PA-C  lidocaine (LIDODERM) 5 % Place 1 patch onto the skin daily. Remove & Discard patch within 12 hours or as directed by MD 07/25/22   Henderly, Britni A, PA-C  methylPREDNISolone (MEDROL DOSEPAK) 4 MG TBPK tablet Take dose pack taper per package instructions 06/11/22   Joen Laura, MD  metroNIDAZOLE (FLAGYL) 500 MG tablet Take 1 tablet (500 mg total) by mouth 2 (two) times daily. 07/06/22   Grayce Sessions, NP  predniSONE (STERAPRED UNI-PAK 21 TAB) 10 MG (21) TBPK tablet Take by mouth daily. Take 6 tabs by mouth daily  for 1 days, then 5 tabs for 1 days, then 4 tabs for 1 days, then 3 tabs for 1 days, 2 tabs for 1 days, then 1 tab by mouth daily for 1 days 07/25/22   Henderly, Britni A, PA-C      Allergies    Apple fruit extract and Pectin    Review of Systems   Review of Systems  Physical Exam Updated Vital Signs BP (!) 115/92   Pulse  75   Temp 98.6 F (37 C) (Oral)   Resp 17   LMP 12/08/2022   SpO2 100%  Physical Exam  ED Results / Procedures / Treatments   Labs (all labs ordered are listed, but only abnormal results are displayed) Labs Reviewed - No data to display  EKG None  Radiology No results found.  Procedures Procedures  {Document cardiac monitor, telemetry assessment procedure when appropriate:1}  Medications Ordered in ED Medications - No data to display  ED Course/ Medical Decision Making/ A&P   {   Click here for ABCD2, HEART and other calculatorsREFRESH Note before signing :1}                          Medical Decision Making  ***  {Document critical care time when appropriate:1} {Document review of labs and clinical decision tools ie heart score, Chads2Vasc2 etc:1}  {Document your independent review of radiology images, and any outside records:1} {Document your discussion with family members, caretakers, and with consultants:1} {Document social determinants of health affecting pt's care:1} {Document your decision making why or why not admission, treatments were needed:1} Final Clinical Impression(s) / ED Diagnoses Final diagnoses:  None    Rx / DC Orders ED Discharge Orders     None

## 2022-12-22 NOTE — Discharge Instructions (Addendum)
For your pain, you may take up to 1000mg  of acetaminophen (tylenol) 4 times daily for up to a week. This is the maximum dose of acetminophen (tylenol) you can take from all sources. Please check other over-the-counter medications and prescriptions to ensure you are not taking other medications that contain acetaminophen.  You may also take ibuprofen 400 mg 6 times a day OR 600mg  4 times a day alternating with or at the same time as tylenol.  Use the flexeril 3 times a day as needed as a muscle relaxant. Take oxycodone as needed for breakthrough pain at a different time than the flexeril. This medication can be addicting, sedating and cause constipation.  You may take these medications with a lidocaine patch in place as well.

## 2023-06-21 ENCOUNTER — Telehealth (INDEPENDENT_AMBULATORY_CARE_PROVIDER_SITE_OTHER): Payer: Self-pay | Admitting: Primary Care

## 2023-06-21 NOTE — Telephone Encounter (Signed)
Verified pt will be at apt.

## 2023-06-22 ENCOUNTER — Ambulatory Visit (INDEPENDENT_AMBULATORY_CARE_PROVIDER_SITE_OTHER): Payer: Medicaid Other | Admitting: Primary Care

## 2024-04-13 ENCOUNTER — Ambulatory Visit (INDEPENDENT_AMBULATORY_CARE_PROVIDER_SITE_OTHER): Payer: MEDICAID | Admitting: Primary Care

## 2024-09-11 DIAGNOSIS — Z1231 Encounter for screening mammogram for malignant neoplasm of breast: Secondary | ICD-10-CM

## 2024-09-28 ENCOUNTER — Encounter (INDEPENDENT_AMBULATORY_CARE_PROVIDER_SITE_OTHER): Payer: MEDICAID | Admitting: Primary Care
